# Patient Record
Sex: Female | Born: 1960 | Race: White | Hispanic: No | Marital: Married | Smoking: Current every day smoker
Health system: Southern US, Community
[De-identification: ages and names within clinical notes are randomized; demographics above are authoritative.]

## PROBLEM LIST (undated history)

## (undated) DIAGNOSIS — M6740 Ganglion, unspecified site: Secondary | ICD-10-CM

## (undated) DIAGNOSIS — R0683 Snoring: Secondary | ICD-10-CM

## (undated) DIAGNOSIS — R7303 Prediabetes: Secondary | ICD-10-CM

## (undated) DIAGNOSIS — F32A Depression, unspecified: Secondary | ICD-10-CM

## (undated) DIAGNOSIS — F419 Anxiety disorder, unspecified: Secondary | ICD-10-CM

## (undated) DIAGNOSIS — F329 Major depressive disorder, single episode, unspecified: Secondary | ICD-10-CM

## (undated) DIAGNOSIS — R112 Nausea with vomiting, unspecified: Secondary | ICD-10-CM

## (undated) DIAGNOSIS — Z9889 Other specified postprocedural states: Secondary | ICD-10-CM

## (undated) HISTORY — PX: APPENDECTOMY: SHX54

## (undated) HISTORY — PX: COLONOSCOPY: SHX174

## (undated) HISTORY — PX: SHOULDER ARTHROSCOPY: SHX128

## (undated) HISTORY — PX: ABDOMINAL HYSTERECTOMY: SHX81

## (undated) HISTORY — PX: INCISION AND DRAINAGE HIP: SHX1801

## (undated) HISTORY — PX: WRIST GANGLION EXCISION: SUR520

---
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
## 1999-01-14 ENCOUNTER — Other Ambulatory Visit: Admission: RE | Admit: 1999-01-14 | Discharge: 1999-01-14 | Payer: Self-pay | Admitting: Obstetrics and Gynecology

## 1999-09-28 ENCOUNTER — Ambulatory Visit (HOSPITAL_COMMUNITY): Admission: RE | Admit: 1999-09-28 | Discharge: 1999-09-28 | Payer: Self-pay | Admitting: Internal Medicine

## 1999-09-28 ENCOUNTER — Encounter: Payer: Self-pay | Admitting: Internal Medicine

## 1999-09-29 ENCOUNTER — Encounter: Payer: Self-pay | Admitting: Internal Medicine

## 1999-09-29 ENCOUNTER — Encounter: Admission: RE | Admit: 1999-09-29 | Discharge: 1999-09-29 | Payer: Self-pay | Admitting: Internal Medicine

## 2000-11-09 ENCOUNTER — Encounter: Admission: RE | Admit: 2000-11-09 | Discharge: 2000-11-09 | Payer: Self-pay | Admitting: Internal Medicine

## 2000-11-09 ENCOUNTER — Encounter: Payer: Self-pay | Admitting: Internal Medicine

## 2000-11-21 ENCOUNTER — Encounter: Admission: RE | Admit: 2000-11-21 | Discharge: 2000-11-21 | Payer: Self-pay | Admitting: Internal Medicine

## 2000-11-21 ENCOUNTER — Encounter: Payer: Self-pay | Admitting: Internal Medicine

## 2001-07-31 ENCOUNTER — Encounter: Admission: RE | Admit: 2001-07-31 | Discharge: 2001-07-31 | Payer: Self-pay | Admitting: Internal Medicine

## 2001-07-31 ENCOUNTER — Encounter: Payer: Self-pay | Admitting: Internal Medicine

## 2002-04-03 ENCOUNTER — Ambulatory Visit (HOSPITAL_BASED_OUTPATIENT_CLINIC_OR_DEPARTMENT_OTHER): Admission: RE | Admit: 2002-04-03 | Discharge: 2002-04-03 | Payer: Self-pay | Admitting: Orthopedic Surgery

## 2003-01-13 ENCOUNTER — Ambulatory Visit (HOSPITAL_COMMUNITY): Admission: AD | Admit: 2003-01-13 | Discharge: 2003-01-13 | Payer: Self-pay | Admitting: Surgery

## 2003-02-04 ENCOUNTER — Encounter: Admission: RE | Admit: 2003-02-04 | Discharge: 2003-02-04 | Payer: Self-pay | Admitting: General Surgery

## 2003-12-22 ENCOUNTER — Emergency Department (HOSPITAL_COMMUNITY): Admission: EM | Admit: 2003-12-22 | Discharge: 2003-12-22 | Payer: Self-pay | Admitting: Emergency Medicine

## 2004-01-28 ENCOUNTER — Ambulatory Visit (HOSPITAL_BASED_OUTPATIENT_CLINIC_OR_DEPARTMENT_OTHER): Admission: RE | Admit: 2004-01-28 | Discharge: 2004-01-28 | Payer: Self-pay | Admitting: Orthopedic Surgery

## 2005-03-04 ENCOUNTER — Ambulatory Visit (HOSPITAL_BASED_OUTPATIENT_CLINIC_OR_DEPARTMENT_OTHER): Admission: RE | Admit: 2005-03-04 | Discharge: 2005-03-04 | Payer: Self-pay | Admitting: Orthopedic Surgery

## 2005-09-29 ENCOUNTER — Other Ambulatory Visit: Admission: RE | Admit: 2005-09-29 | Discharge: 2005-09-29 | Payer: Self-pay | Admitting: Internal Medicine

## 2007-01-10 ENCOUNTER — Ambulatory Visit (HOSPITAL_BASED_OUTPATIENT_CLINIC_OR_DEPARTMENT_OTHER): Admission: RE | Admit: 2007-01-10 | Discharge: 2007-01-10 | Payer: Self-pay | Admitting: Orthopedic Surgery

## 2008-01-28 ENCOUNTER — Encounter: Admission: RE | Admit: 2008-01-28 | Discharge: 2008-01-28 | Payer: Self-pay | Admitting: Internal Medicine

## 2010-06-08 NOTE — Op Note (Signed)
Norma [name], Norma [name]                  ACCOUNT NO.:  [id number]   MEDICAL RECORD NO.:  [id number]          PATIENT TYPE:  AMB   LOCATION:  DSC                          FACILITY:  [facility]   PHYSICIAN:  Harvie Junior, M.D.   DATE OF BIRTH:  05-Oct-1960   DATE OF PROCEDURE:  01/10/2007  DATE OF DISCHARGE:                               OPERATIVE REPORT   PREOPERATIVE DIAGNOSIS:  Volar ganglion cyst, right, recurrent.   POSTOPERATIVE DIAGNOSIS:  Volar ganglion cyst, right, recurrent.   PROCEDURE:  Excision of volar ganglion cyst, right.   SURGEON:  Harvie Junior, M.D.   ASSISTANT:  Marshia Ly, P.A.-C.   ANESTHESIA:  General.   BRIEF HISTORY:  [name] is a 50 year old female with a long history of  having had a volar ganglion cyst excised, it was done in what we thought  was appropriate technique, we traced it down into the wrist joint, I  felt that we had excised it well.  She had a recurrence about two years  out versus a new cyst.  At any rate, we talked about treatment options,  but it was bothering her, so we ultimately elected for excision.  She is  brought to the operating room for this procedure.   DESCRIPTION OF PROCEDURE:  The patient was brought to the operating room  and after adequate anesthesia was obtained with general anesthetic, the  patient was placed supine upon the operating table.  The right arm was  prepped and draped in the usual sterile fashion.  Following this, the  arm was exsanguinated and blood pressure tourniquet inflated to 250  mmHg.  Following this, a curved incision was made in the area of the old  incision.  The subcutaneous tissue were taken down to the level of the  cyst.  This was clearly identified on all borders and tracked down to  the carpal canal.  Once we had it opened on all sides, we then excised a  portion of the wrist capsule and identified the distal radius,  identified the scaphoid and scapholunate interval, and took a Bovie and  bovied the edges of the wrist opening at this level.  Once that was  completed, the wound was copiously and thoroughly irrigated.  The  tourniquet was let down.  The radial artery was palpated and was  functioning fine.  There was no significant bleeding encountered.  Interrupted nylon stitches were used to close the wound.  A sterile  compressive dressing was applied as well as a volar plaster.  She did  have a postoperative infection with MRSA last time and so we are going  to put her prophylactically on some Doxycycline to go home with and  hopefully this will prevent any kind of  infectious recurrence because we do not know why she had the recurrent  ganglion cyst in the first place and we thought that maybe that was a  portion of it.  At any rate, we are going to do that.  We will see her  back in the office in about two weeks.  Estimated blood loss was none.  Complications none.      Harvie Junior, M.D.  Electronically Signed     JLG/MEDQ  D:  01/10/2007  T:  01/10/2007  Job:  [id number]

## 2010-06-11 NOTE — Op Note (Signed)
NAME:  Norma [name], Norma [name]                            ACCOUNT NO.:  [id number]   MEDICAL RECORD NO.:  [id number]                   PATIENT TYPE:  AMB   LOCATION:  DAY                                  FACILITY[facility]  [facility]   PHYSICIAN:  Velora Heckler, M.D.                DATE OF BIRTH:  [birth year]   DATE OF PROCEDURE:  01/13/2003  DATE OF DISCHARGE:                                 OPERATIVE REPORT   PREOPERATIVE DIAGNOSIS:  Right hip abscess and cellulitis.   POSTOPERATIVE DIAGNOSIS:  Right hip abscess and cellulitis.   PROCEDURES:  1. Sharp debridement necrotic skin (1 x 2.5 cm) right hip.  2. Incision and drainage abscess right hip.   SURGEON:  Velora Heckler, M.D.   ANESTHESIA:  General per Dr. Sherrian Divers.   ESTIMATED BLOOD LOSS:  Minimal.   PREPARATION:  Betadine.   COMPLICATIONS:  None.   INDICATIONS:  The patient is a 50 year old white female, who presents with  an area of cellulitis and abscess on the right hip.  This had been present  for four days.  It developed during a trip to the [place].  She is  uncertain as to whether this represented trauma or a bite or sting.  It has  progressed despite oral Levaquin.  The patient now comes to surgery for  debridement.   DESCRIPTION OF PROCEDURE:  The procedure is done in OR #1 at the [facility].  The patient is brought to the operating room and placed  in a supine position on the operating room table.  Following administration  of general anesthesia, the patient is turned to a left lateral decubitus  position.  The patient is then prepped and draped in the usual strict  aseptic fashion.  After ascertaining that adequate  level of anesthesia had  been obtained, the area of necrotic skin on the right hip is excised with a  #10 blade.  The area measures approximately 1 cm in width x 2.5 cm in  length.  It is debrided full-thickness, and hemostasis obtained with the  electrocautery.  Subcutaneous tissues are  probed, and there is a small  abscess cavity with surrounding induration.  Cultures are taken for both  aerobic and anaerobic organisms.  The wound is debrided and opened widely  using the electrocautery for hemostasis.  The wound is copiously irrigated  with warm saline.  It is then packed with half-inch Iodoform gauze packing,  then covered with dry gauze and an ABD pad.  The patient tolerated the  procedure well and was moved to the recovery room in stable condition.                                               Tawanna Cooler  Molly Maduro, M.D.   TMG/MEDQ  D:  01/13/2003  T:  01/13/2003  Job:  [id number]

## 2010-06-11 NOTE — Op Note (Signed)
NAME:  Norma [name], Norma [name]                  ACCOUNT NO.:  [id number]   MEDICAL RECORD NO.:  [id number]          PATIENT TYPE:  AMB   LOCATION:  DSC                          FACILITY:  [facility]   PHYSICIAN:  Harvie Junior, M.D.   DATE OF BIRTH:  Jul 12, 1960   DATE OF PROCEDURE:  01/28/2004  DATE OF DISCHARGE:                                 OPERATIVE REPORT   PREOPERATIVE DIAGNOSIS:  Volar ganglion cyst, right wrist.   POSTOPERATIVE DIAGNOSIS:  Volar ganglion cyst, right wrist.   PROCEDURE:  Excision of volar ganglion cyst, right wrist.   SURGEON:  Harvie Junior, M.D.   ASSISTANT:  Marshia Ly, P.A.   ANESTHESIA:  General.   BRIEF HISTORY:  She is a 50 year old female with a history of having  developed a cyst on her right wrist.  She ultimately watched this grow over  a period of time.  Because of her complaints of pain and continued growth  she ultimately wanted to have the cyst removed.  She was brought to the  operating room for this procedure.   DESCRIPTION OF PROCEDURE:  The patient was brought to the operating room and  after adequate anesthesia was obtained with general anesthetic the patient  was placed on the operating room table and the right arm was prepped and  draped in the usual sterile fashion.  Following this, the arm was  exsanguinated and blood pressure tourniquet was inflated to 350 mmHg. Under  loop magnification the incision was outlined and then a long curved incision  was made over the area of the ganglion cyst.  The subcutaneous tissue was  dissected down to the level of the cyst.  The cyst was trilobed and really  tracked up the wrist quite a bit.  The small branches of veins which were in  a web-fashion over the cyst were identified and divided under loop  magnification.  The central portion of the radial artery was identified and  went happily radial to the main body of the cyst. The cyst was tracked down  into the wrist joint.  The wrist joint was opened as  the cyst entered the  volar aspect of the wrist and cauterized throughout the opening.   At this time, the remainder of the cyst was removed in total.  At this time  the tourniquet was let down. There was some venous bleeding which was  controlled with electrocautery.  The radial artery was identified as it went  down into the wrist.  There was no evidence of breach of the radial artery.   At this point the wound was copiously irrigated and suctioned dry.  The skin  was closed with a combination of 4-0 Vicryl and 4-0 Maxon pull out sutures.  Benzoin and Steri-Strips were applied.  A sterile compressive dressing was  applied, as well as a volar plaster.  The patient was taken to recovery; she  was noted to be in satisfactory condition.   ESTIMATED BLOOD LOSS:  __________      Ranae Plumber  D:  01/28/2004  T:  01/28/2004  Job:  [id number]   cc:   Harvie Junior, M.D.  [address]  [place]  [place] [address]  Fax: [phone]

## 2010-06-11 NOTE — Op Note (Signed)
NAME:  Norma [name], Norma [name]                  ACCOUNT NO.:  [id number]   MEDICAL RECORD NO.:  [id number]          PATIENT TYPE:  AMB   LOCATION:  DSC                          FACILITY:  [facility]   PHYSICIAN:  Harvie Junior, M.D.   DATE OF BIRTH:  [birth year]   DATE OF PROCEDURE:  03/04/2005  DATE OF DISCHARGE:  03/04/2005                                 OPERATIVE REPORT   PREOPERATIVE DIAGNOSIS:  Volar ganglion cyst.   POSTOPERATIVE DIAGNOSIS:  Volar ganglion cyst.   OPERATION PERFORMED:  Removal of volar ganglion cyst, left wrist.   SURGEON:  Harvie Junior, M.D.   ASSISTANT:  Marshia Ly, P. A.   ANESTHESIA:  General.   BRIEF HISTORY:  [name] is a 50 year old female with a long history of  having had a cystic mass on her left wrist.  We had a long discussion about  treatment options including aspiration versus removal.  She ultimately  elected for removal and she was brought to the operating room for this  procedure.   DESCRIPTION OF THE OPERATION:  The patient was brought to the operating room  and conscious sedation was obtained with a general __________.  The patient  was placed on the operating table.  The left upper extremity was then  prepped and draped in the usual sterile fashion.  Following this a curved  incision was made over the distal cystic area.  Subcutaneous tissue was  excised down to the level of the cyst.  The cyst was clearly identified and  divided free from all the underlying connections.  At that point the cyst  was excised completely.  The stalk of the cyst was tract down to the  scapholunate interosseous ligament.  Once this was accomplished the cystic  area was removed completely.  The opening around the cyst was then  cauterized.  Following this the wound was copiously irrigated and suctioned  dry.  The wound was then closed in layers.  A sterile compression dressing  was applied.   The patient was taken to recovery where she was noted to be in  satisfactory  condition in a small volar plaster splint.   ESTIMATED BLOOD LOSS:  The estimated blood loss for the procedure was none.   COMPLICATIONS:  There were no complications.      Harvie Junior, M.D.  Electronically Signed     JLG/MEDQ  D:  06/22/2005  T:  06/23/2005  Job:  [id number]

## 2010-06-11 NOTE — Op Note (Signed)
NAME:  MILLIANNA, SZYMBORSKI                            ACCOUNT NO.:  [id number]   MEDICAL RECORD NO.:  [id number]                   PATIENT TYPE:  AMB   LOCATION:  DSC                                  FACILITY:  [facility]   PHYSICIAN:  Harvie Junior, M.D.                DATE OF BIRTH:  Nov 08, 1960   DATE OF PROCEDURE:  04/03/2002  DATE OF DISCHARGE:                                 OPERATIVE REPORT   PREOPERATIVE DIAGNOSES:  1. Impingement.  2. Acromioclavicular joint arthritis.   POSTOPERATIVE DIAGNOSES:  1. Impingement.  2. Acromioclavicular joint arthritis.  3. Superior labral tear.   PROCEDURES:  1. Anterolateral acromioplasty.  2. Distal clavicle resection.  3. Debridement of the superior labral tear.   SURGEON:  Harvie Junior, M.D.   ASSISTANT:  Marshia Ly, P.A.   ANESTHESIA:  General.   BRIEF HISTORY:  She is a 50 year old female with a long history of having  right shoulder pain.  We ultimately treated her with injection therapy,  activity modification, and medication, exercise therapy.  All of this failed  and because of continued and chronic complaints of pain in the right  shoulder, she was ultimately brought to the operating room for subacromial  decompression and distal clavicle resection.  Preoperative evaluation showed  that she had a type 2 acromion, she had narrowing at the [facility] joint.  She did  not have a preoperative MRI.  The patient was brought to the operating room  for evaluation under anesthesia and arthroscopy.   DESCRIPTION OF PROCEDURE:  The patient was brought to the operating room and  after adequate anesthesia was obtained with a general anesthetic, the  patient positioned supine and the right arm was prepped and draped in the  usual sterile fashion.  Following this, routine arthroscopic examination of  the shoulder revealed that there was an obvious anterior superior labral  tear.  This was debrided from within the glenohumeral joint.  Once this  was  accomplished, the rotator cuff was evaluated from the undersurface.  There  was some partial-thickness tearing from the glenohumeral joint.  It was not  dramatic, but we did debride the undersurface of the rotator cuff.  The  glenohumeral joint was evaluated and noted to have no significant evidence  of abnormality.  The labrum was normal other than the superior labrum, which  had been debrided.  Following this the cannula was removed from the  glenohumeral joint.  Attention was turned to the subacromial space, where an  anterolateral acromioplasty was performed with a motorized bur.  The bur was  taken from the lateral portal and also from the anterior portal to allow for  adequate resection of the anterior portion of the acromion.  Once this had  been accomplished, attention was turned to the distal clavicle, where a  distal clavicle resection of 15 mm was undertaken arthroscopically.  Once  this was accomplished, attention was turned to the posterior portal, where  the rotator cuff was evaluated.  A significant subtotal bursectomy was then  undertaken and the bursa was cleaned off the rotator cuff from the side,  top, and back.  At this point the rotator cuff was evaluated thoroughly.  There was no evidence of full-thickness tearing or even partial-thickness  tearing from the superior surface.  At this point the shoulder was copiously  irrigated and suctioned dry.  The arthroscopic portals were closed with a  bandage, a sterile and compressive dressing was applied, and the patient was  taken to the recovery room, where she was noted to be in satisfactory  condition.  Estimated blood loss for the procedure was none.                                                  Harvie Junior, M.D.    Ranae Plumber  D:  04/03/2002  T:  04/04/2002  Job:  [id number]

## 2010-06-29 ENCOUNTER — Emergency Department (HOSPITAL_COMMUNITY)
Admission: EM | Admit: 2010-06-29 | Discharge: 2010-06-29 | Disposition: A | Discharge status: Home / Self Care | Payer: 59 | Attending: Emergency Medicine | Admitting: Emergency Medicine

## 2010-06-29 ENCOUNTER — Emergency Department (HOSPITAL_COMMUNITY): Payer: 59

## 2010-06-29 DIAGNOSIS — L259 Unspecified contact dermatitis, unspecified cause: Secondary | ICD-10-CM | POA: Insufficient documentation

## 2010-06-29 DIAGNOSIS — M7989 Other specified soft tissue disorders: Secondary | ICD-10-CM | POA: Insufficient documentation

## 2010-06-29 DIAGNOSIS — S6990XA Unspecified injury of unspecified wrist, hand and finger(s), initial encounter: Secondary | ICD-10-CM | POA: Insufficient documentation

## 2010-06-29 DIAGNOSIS — X500XXA Overexertion from strenuous movement or load, initial encounter: Secondary | ICD-10-CM | POA: Insufficient documentation

## 2010-06-29 DIAGNOSIS — M20009 Unspecified deformity of unspecified finger(s): Secondary | ICD-10-CM | POA: Insufficient documentation

## 2010-06-29 DIAGNOSIS — S6980XA Other specified injuries of unspecified wrist, hand and finger(s), initial encounter: Secondary | ICD-10-CM | POA: Insufficient documentation

## 2010-06-29 DIAGNOSIS — R110 Nausea: Secondary | ICD-10-CM | POA: Insufficient documentation

## 2010-06-29 DIAGNOSIS — IMO0002 Reserved for concepts with insufficient information to code with codable children: Secondary | ICD-10-CM | POA: Insufficient documentation

## 2010-06-29 DIAGNOSIS — M79609 Pain in unspecified limb: Secondary | ICD-10-CM | POA: Insufficient documentation

## 2010-06-29 DIAGNOSIS — S6000XA Contusion of unspecified finger without damage to nail, initial encounter: Secondary | ICD-10-CM | POA: Insufficient documentation

## 2010-06-29 DIAGNOSIS — F411 Generalized anxiety disorder: Secondary | ICD-10-CM | POA: Insufficient documentation
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
## 2010-10-29 LAB — BASIC METABOLIC PANEL
BUN: 10
CO2: 25
Chloride: 103
Glucose, Bld: 95
Potassium: 4.4

## 2011-03-28 ENCOUNTER — Encounter (HOSPITAL_BASED_OUTPATIENT_CLINIC_OR_DEPARTMENT_OTHER): Payer: Self-pay | Admitting: *Deleted

## 2011-03-29 ENCOUNTER — Other Ambulatory Visit: Payer: Self-pay | Admitting: Orthopedic Surgery

## 2011-03-30 ENCOUNTER — Encounter (HOSPITAL_BASED_OUTPATIENT_CLINIC_OR_DEPARTMENT_OTHER): Payer: Self-pay | Admitting: Anesthesiology

## 2011-03-30 ENCOUNTER — Ambulatory Visit (HOSPITAL_BASED_OUTPATIENT_CLINIC_OR_DEPARTMENT_OTHER)
Admission: RE | Admit: 2011-03-30 | Discharge: 2011-03-30 | Disposition: A | Discharge status: Home / Self Care | Payer: 59 | Source: Ambulatory Visit | Attending: Orthopedic Surgery | Admitting: Orthopedic Surgery

## 2011-03-30 ENCOUNTER — Encounter (HOSPITAL_BASED_OUTPATIENT_CLINIC_OR_DEPARTMENT_OTHER)
Admission: RE | Disposition: A | Discharge status: Home / Self Care | Payer: Self-pay | Source: Ambulatory Visit | Attending: Orthopedic Surgery

## 2011-03-30 ENCOUNTER — Ambulatory Visit (HOSPITAL_BASED_OUTPATIENT_CLINIC_OR_DEPARTMENT_OTHER): Payer: 59 | Admitting: Anesthesiology

## 2011-03-30 ENCOUNTER — Encounter (HOSPITAL_BASED_OUTPATIENT_CLINIC_OR_DEPARTMENT_OTHER): Payer: Self-pay | Admitting: *Deleted

## 2011-03-30 DIAGNOSIS — M19019 Primary osteoarthritis, unspecified shoulder: Secondary | ICD-10-CM | POA: Insufficient documentation

## 2011-03-30 DIAGNOSIS — F411 Generalized anxiety disorder: Secondary | ICD-10-CM | POA: Insufficient documentation

## 2011-03-30 DIAGNOSIS — Z5333 Arthroscopic surgical procedure converted to open procedure: Secondary | ICD-10-CM | POA: Insufficient documentation

## 2011-03-30 DIAGNOSIS — F329 Major depressive disorder, single episode, unspecified: Secondary | ICD-10-CM | POA: Insufficient documentation

## 2011-03-30 DIAGNOSIS — M24019 Loose body in unspecified shoulder: Secondary | ICD-10-CM | POA: Insufficient documentation

## 2011-03-30 DIAGNOSIS — Z01812 Encounter for preprocedural laboratory examination: Secondary | ICD-10-CM | POA: Insufficient documentation

## 2011-03-30 DIAGNOSIS — F3289 Other specified depressive episodes: Secondary | ICD-10-CM | POA: Insufficient documentation

## 2011-03-30 DIAGNOSIS — M66329 Spontaneous rupture of flexor tendons, unspecified upper arm: Secondary | ICD-10-CM | POA: Insufficient documentation

## 2011-03-30 DIAGNOSIS — M25819 Other specified joint disorders, unspecified shoulder: Secondary | ICD-10-CM | POA: Insufficient documentation

## 2011-03-30 DIAGNOSIS — M25519 Pain in unspecified shoulder: Secondary | ICD-10-CM | POA: Insufficient documentation

## 2011-03-30 HISTORY — DX: Major depressive disorder, single episode, unspecified: F32.9

## 2011-03-30 HISTORY — DX: Depression, unspecified: F32.A

## 2011-03-30 HISTORY — DX: Anxiety disorder, unspecified: F41.9

## 2011-03-30 SURGERY — SHOULDER ARTHROSCOPY WITH SUBACROMIAL DECOMPRESSION
Anesthesia: General | Site: Shoulder | Laterality: Right | Wound class: Clean

## 2011-03-30 MED ORDER — MIDAZOLAM HCL 2 MG/2ML IJ SOLN
1.0000 mg | INTRAMUSCULAR | Status: DC | PRN
  Administered 2011-03-30: 2 mg via INTRAVENOUS

## 2011-03-30 MED ORDER — FENTANYL CITRATE 0.05 MG/ML IJ SOLN
INTRAMUSCULAR | Status: DC | PRN
  Administered 2011-03-30: 50 ug via INTRAVENOUS
  Administered 2011-03-30 (×2): 25 ug via INTRAVENOUS

## 2011-03-30 MED ORDER — MIDAZOLAM HCL 5 MG/5ML IJ SOLN
INTRAMUSCULAR | Status: DC | PRN
  Administered 2011-03-30: 2 mg via INTRAVENOUS

## 2011-03-30 MED ORDER — SODIUM CHLORIDE 0.9 % IR SOLN
Status: DC | PRN
  Administered 2011-03-30: 15:00:00

## 2011-03-30 MED ORDER — HYDROMORPHONE HCL 2 MG PO TABS
2.0000 mg | ORAL_TABLET | Freq: Once | ORAL | Status: AC
  Administered 2011-03-30: 2 mg via ORAL

## 2011-03-30 MED ORDER — SUCCINYLCHOLINE CHLORIDE 20 MG/ML IJ SOLN
INTRAMUSCULAR | Status: DC | PRN
  Administered 2011-03-30: 100 mg via INTRAVENOUS

## 2011-03-30 MED ORDER — ONDANSETRON HCL 4 MG/2ML IJ SOLN
4.0000 mg | Freq: Once | INTRAMUSCULAR | Status: AC
  Administered 2011-03-30: 4 mg via INTRAVENOUS

## 2011-03-30 MED ORDER — POVIDONE-IODINE 7.5 % EX SOLN: Freq: Once | CUTANEOUS | Status: DC

## 2011-03-30 MED ORDER — PROPOFOL 10 MG/ML IV EMUL
INTRAVENOUS | Status: DC | PRN
  Administered 2011-03-30: 260 mg via INTRAVENOUS

## 2011-03-30 MED ORDER — ONDANSETRON HCL 4 MG/2ML IJ SOLN: 4.0000 mg | Freq: Once | INTRAMUSCULAR | Status: DC | PRN

## 2011-03-30 MED ORDER — HYDROMORPHONE HCL 2 MG PO TABS: ORAL_TABLET | ORAL | Status: DC

## 2011-03-30 MED ORDER — MORPHINE SULFATE 4 MG/ML IJ SOLN: 0.0500 mg/kg | INTRAMUSCULAR | Status: DC | PRN

## 2011-03-30 MED ORDER — FENTANYL CITRATE 0.05 MG/ML IJ SOLN
50.0000 ug | INTRAMUSCULAR | Status: DC | PRN
  Administered 2011-03-30: 100 ug via INTRAVENOUS

## 2011-03-30 MED ORDER — BUPIVACAINE-EPINEPHRINE PF 0.5-1:200000 % IJ SOLN
INTRAMUSCULAR | Status: DC | PRN
  Administered 2011-03-30: 25 mL

## 2011-03-30 MED ORDER — KETOROLAC TROMETHAMINE 30 MG/ML IJ SOLN
INTRAMUSCULAR | Status: DC | PRN
  Administered 2011-03-30: 30 mg via INTRAVENOUS

## 2011-03-30 MED ORDER — DEXAMETHASONE SODIUM PHOSPHATE 4 MG/ML IJ SOLN
INTRAMUSCULAR | Status: DC | PRN
  Administered 2011-03-30: 10 mg via INTRAVENOUS

## 2011-03-30 MED ORDER — CEFAZOLIN SODIUM 1-5 GM-% IV SOLN
1.0000 g | Freq: Once | INTRAVENOUS | Status: AC
  Administered 2011-03-30: 1 g via INTRAVENOUS

## 2011-03-30 MED ORDER — ONDANSETRON HCL 4 MG/2ML IJ SOLN
INTRAMUSCULAR | Status: DC | PRN
  Administered 2011-03-30: 4 mg via INTRAVENOUS

## 2011-03-30 MED ORDER — LIDOCAINE HCL (CARDIAC) 20 MG/ML IV SOLN
INTRAVENOUS | Status: DC | PRN
  Administered 2011-03-30: 80 mg via INTRAVENOUS

## 2011-03-30 MED ORDER — LACTATED RINGERS IV SOLN
INTRAVENOUS | Status: DC
  Administered 2011-03-30: 10 mL/h via INTRAVENOUS
  Administered 2011-03-30 (×2): via INTRAVENOUS

## 2011-03-30 MED ORDER — CEFAZOLIN SODIUM-DEXTROSE 2-3 GM-% IV SOLR
2.0000 g | INTRAVENOUS | Status: AC
  Administered 2011-03-30: 2 g via INTRAVENOUS

## 2011-03-30 MED ORDER — HYDROMORPHONE HCL PF 1 MG/ML IJ SOLN
0.2500 mg | INTRAMUSCULAR | Status: DC | PRN
  Administered 2011-03-30 (×3): 0.5 mg via INTRAVENOUS

## 2011-03-30 MED ORDER — OXYCODONE-ACETAMINOPHEN 5-325 MG PO TABS: 1.0000 | ORAL_TABLET | Freq: Four times a day (QID) | ORAL | Status: DC | PRN

## 2011-03-30 SURGICAL SUPPLY — 76 items
BENZOIN TINCTURE PRP APPL 2/3 (GAUZE/BANDAGES/DRESSINGS) ×2 IMPLANT
BLADE SURG 15 STRL LF DISP TIS (BLADE) IMPLANT
BLADE SURG 15 STRL SS (BLADE)
BLADE VORTEX 6.0 (BLADE) ×2 IMPLANT
CANISTER OMNI JUG 16 LITER (MISCELLANEOUS) ×2 IMPLANT
CANISTER SUCTION 2500CC (MISCELLANEOUS) IMPLANT
CANNULA 5.75X71 LONG (CANNULA) IMPLANT
CANNULA TWIST IN 8.25X7CM (CANNULA) IMPLANT
CLOTH BEACON ORANGE TIMEOUT ST (SAFETY) ×2 IMPLANT
CUTTER MENISCUS  4.2MM (BLADE) ×1
CUTTER MENISCUS 4.2MM (BLADE) ×1 IMPLANT
DECANTER SPIKE VIAL GLASS SM (MISCELLANEOUS) IMPLANT
DRAPE INCISE IOBAN 66X45 STRL (DRAPES) ×2 IMPLANT
DRAPE STERI 35X30 U-POUCH (DRAPES) ×2 IMPLANT
DRAPE SURG 17X23 STRL (DRAPES) ×2 IMPLANT
DRAPE U-SHAPE 47X51 STRL (DRAPES) ×2 IMPLANT
DRAPE U-SHAPE 76X120 STRL (DRAPES) ×4 IMPLANT
DRSG EMULSION OIL 3X3 NADH (GAUZE/BANDAGES/DRESSINGS) ×2 IMPLANT
DRSG PAD ABDOMINAL 8X10 ST (GAUZE/BANDAGES/DRESSINGS) ×2 IMPLANT
DURAPREP 26ML APPLICATOR (WOUND CARE) ×2 IMPLANT
ELECT REM PT RETURN 9FT ADLT (ELECTROSURGICAL) ×2
ELECTRODE REM PT RTRN 9FT ADLT (ELECTROSURGICAL) ×1 IMPLANT
GAUZE SPONGE 4X4 12PLY STRL LF (GAUZE/BANDAGES/DRESSINGS) ×2 IMPLANT
GLOVE BIO SURGEON STRL SZ 6.5 (GLOVE) ×2 IMPLANT
GLOVE BIOGEL M STRL SZ7.5 (GLOVE) ×2 IMPLANT
GLOVE BIOGEL PI IND STRL 8 (GLOVE) ×3 IMPLANT
GLOVE BIOGEL PI INDICATOR 8 (GLOVE) ×3
GLOVE ECLIPSE 7.5 STRL STRAW (GLOVE) ×4 IMPLANT
GOWN BRE IMP PREV XXLGXLNG (GOWN DISPOSABLE) ×2 IMPLANT
GOWN PREVENTION PLUS XLARGE (GOWN DISPOSABLE) ×2 IMPLANT
GOWN PREVENTION PLUS XXLARGE (GOWN DISPOSABLE) ×2 IMPLANT
KIT BIO-TENODESIS 3X8 DISP (MISCELLANEOUS) ×1
KIT INSRT BABSR STRL DISP BTN (MISCELLANEOUS) ×1 IMPLANT
NDL SUT 6 .5 CRC .975X.05 MAYO (NEEDLE) ×1 IMPLANT
NEEDLE 1/2 CIR CATGUT .05X1.09 (NEEDLE) IMPLANT
NEEDLE HYPO 18GX1.5 BLUNT FILL (NEEDLE) ×2 IMPLANT
NEEDLE MAYO TAPER (NEEDLE) ×1
NEEDLE SCORPION MULTI FIRE (NEEDLE) IMPLANT
NS IRRIG 1000ML POUR BTL (IV SOLUTION) IMPLANT
PACK ARTHROSCOPY DSU (CUSTOM PROCEDURE TRAY) ×4 IMPLANT
PACK BASIN DAY SURGERY FS (CUSTOM PROCEDURE TRAY) ×2 IMPLANT
PASSER SUT SWANSON 36MM LOOP (INSTRUMENTS) IMPLANT
PENCIL BUTTON HOLSTER BLD 10FT (ELECTRODE) ×2 IMPLANT
SCREW BIO TENODEIS 7MM (Screw) ×2 IMPLANT
SET IRRIG Y TYPE TUR BLADDER L (SET/KITS/TRAYS/PACK) ×2 IMPLANT
SLING ARM FOAM STRAP LRG (SOFTGOODS) IMPLANT
SLING ARM FOAM STRAP MED (SOFTGOODS) IMPLANT
SLING ARM FOAM STRAP XLG (SOFTGOODS) ×2 IMPLANT
SLING ARM IMMOBILIZER LRG (SOFTGOODS) IMPLANT
SPONGE GAUZE 4X4 12PLY (GAUZE/BANDAGES/DRESSINGS) ×2 IMPLANT
SPONGE LAP 4X18 X RAY DECT (DISPOSABLE) ×2 IMPLANT
STRIP CLOSURE SKIN 1/2X4 (GAUZE/BANDAGES/DRESSINGS) ×2 IMPLANT
SUCTION FRAZIER TIP 10 FR DISP (SUCTIONS) ×2 IMPLANT
SUT BONE WAX W31G (SUTURE) ×2 IMPLANT
SUT ETHIBOND 2 OS 4 DA (SUTURE) IMPLANT
SUT ETHILON 4 0 PS 2 18 (SUTURE) IMPLANT
SUT MNCRL AB 3-0 PS2 18 (SUTURE) ×2 IMPLANT
SUT PDS AB 0 CT 36 (SUTURE) IMPLANT
SUT PROLENE 3 0 PS 2 (SUTURE) IMPLANT
SUT TICRON 1 T 12 (SUTURE) IMPLANT
SUT TIGER TAPE 7 IN WHITE (SUTURE) IMPLANT
SUT VIC AB 0 CT1 27 (SUTURE) ×1
SUT VIC AB 0 CT1 27XBRD ANBCTR (SUTURE) ×1 IMPLANT
SUT VIC AB 1 CT1 27 (SUTURE) ×1
SUT VIC AB 1 CT1 27XBRD ANBCTR (SUTURE) ×1 IMPLANT
SUT VIC AB 2-0 SH 27 (SUTURE) ×1
SUT VIC AB 2-0 SH 27XBRD (SUTURE) ×1 IMPLANT
SYR 5ML LL (SYRINGE) ×2 IMPLANT
TAPE CLOTH SURG 6X10 WHT LF (GAUZE/BANDAGES/DRESSINGS) ×2 IMPLANT
TAPE FIBER 2MM 7IN #2 BLUE (SUTURE) IMPLANT
TOWEL OR 17X24 6PK STRL BLUE (TOWEL DISPOSABLE) ×4 IMPLANT
TOWEL OR NON WOVEN STRL DISP B (DISPOSABLE) ×2 IMPLANT
TUBE CONNECTING 20X1/4 (TUBING) IMPLANT
WAND STAR VAC 90 (SURGICAL WAND) ×2 IMPLANT
WATER STERILE IRR 1000ML POUR (IV SOLUTION) ×2 IMPLANT
YANKAUER SUCT BULB TIP NO VENT (SUCTIONS) IMPLANT

## 2011-03-30 NOTE — Anesthesia Preprocedure Evaluation (Signed)
Anesthesia Evaluation  Patient identified by MRN, date of birth, ID band Patient awake    Reviewed: Allergy & Precautions, H&P , NPO status , Patient's Chart, lab work & pertinent test results  Airway Mallampati: I TM Distance: >3 FB     Dental No notable dental hx.    Pulmonary  breath sounds clear to auscultation        Cardiovascular Rhythm:Regular Rate:Normal     Neuro/Psych    GI/Hepatic   Endo/Other    Renal/GU      Musculoskeletal   Abdominal   Peds  Hematology   Anesthesia Other Findings   Reproductive/Obstetrics                           Anesthesia Physical Anesthesia Plan  ASA: I  Anesthesia Plan: General   Post-op Pain Management:    Induction: Intravenous  Airway Management Planned: Oral ETT  Additional Equipment:   Intra-op Plan:   Post-operative Plan: Extubation in OR  Informed Consent: I have reviewed the patients History and Physical, chart, labs and discussed the procedure including the risks, benefits and alternatives for the proposed anesthesia with the patient or authorized representative who has indicated his/her understanding and acceptance.   Dental advisory given  Plan Discussed with: CRNA, Anesthesiologist and Surgeon  Anesthesia Plan Comments:         Anesthesia Quick Evaluation

## 2011-03-30 NOTE — Anesthesia Procedure Notes (Addendum)
Anesthesia Regional Block:  Interscalene brachial plexus block  Pre-Anesthetic Checklist: ,, timeout performed, Correct Patient, Correct Site, Correct Laterality, Correct Procedure, Correct Position, site marked, Risks and benefits discussed,  Surgical consent,  Pre-op evaluation,  At surgeon's request and post-op pain management  Laterality: Right and Upper  Prep: chloraprep       Needles:  Injection technique: Single-shot  Needle Type: Echogenic Needle     Needle Length: 5cm 5 cm Needle Gauge: 22 and 22 G    Additional Needles:  Procedures: ultrasound guided Interscalene brachial plexus block Narrative:  Start time: 03/30/2011 1:50 PM End time: 03/30/2011 2:05 PM Injection made incrementally with aspirations every 5 mL.  Performed by: Personally  Anesthesiologist: Sheldon Silvan  Supraclavicular block Procedure Name: Intubation Date/Time: 03/30/2011 2:56 PM Performed by: Gladys Damme Pre-anesthesia Checklist: Patient identified, Emergency Drugs available, Suction available and Patient being monitored Patient Re-evaluated:Patient Re-evaluated prior to inductionOxygen Delivery Method: Circle system utilized Preoxygenation: Pre-oxygenation with 100% oxygen Intubation Type: IV induction Ventilation: Mask ventilation without difficulty Laryngoscope Size: Miller and 2 Grade View: Grade I Tube type: Oral Number of attempts: 1 Placement Confirmation: ETT inserted through vocal cords under direct vision,  breath sounds checked- equal and bilateral and positive ETCO2 Secured at: 22 cm Tube secured with: Tape Dental Injury: Teeth and Oropharynx as per pre-operative assessment

## 2011-03-30 NOTE — Discharge Instructions (Signed)
[facility]  [address] [place], [place] [address] [phone]   Post Anesthesia Home Care Instructions  Activity: Get plenty of rest for the remainder of the day. A responsible adult should stay with you for 24 hours following the procedure.  For the next 24 hours, DO NOT: -Drive a car -Advertising copywriter -Drink alcoholic beverages -Take any medication unless instructed by your physician -Make any legal decisions or sign important papers.  Meals: Start with liquid foods such as gelatin or soup. Progress to regular foods as tolerated. Avoid greasy, spicy, heavy foods. If nausea and/or vomiting occur, drink only clear liquids until the nausea and/or vomiting subsides. Call your physician if vomiting continues.  Special Instructions/Symptoms: Your throat may feel dry or sore from the anesthesia or the breathing tube placed in your throat during surgery. If this causes discomfort, gargle with warm salt water. The discomfort should disappear within 24 hours.         Discharge Instructions after Arthroscopic Shoulder Surgery   A sling has been provided for you. You may remove the sling after 72 hours. The sling may be worn for your protection, if you are in a crowd.  Use ice on the shoulder intermittently over the first 48 hours after surgery.  Pain medication has been prescribed for you.  Use your medication liberally over the first 48 hours, and then begin to taper your use. You may take Extra Strength Tylenol or Tylenol only in place of the pain pills. DO NOT take ANY nonsteroidal anti-inflammatory pain medications: Advil, Motrin, Ibuprofen, Aleve, Naproxen, or Naprosyn.  You may remove your dressing after two days.  You may shower 5 days after surgery. The incision CANNOT get wet prior to 5 days. Simply allow the water to wash over the site and then pat dry. Do not rub the incision. Make sure your axilla (armpit) is completely dry after showering.  Take one  aspirin a day for 2 weeks after surgery, unless you have an aspirin sensitivity/allergy or asthma.  Three to 5 times each day you should perform assisted overhead reaching and external rotation (outward turning) exercises with the operative arm. Both exercises should be done with the non-operative arm used as the "therapist arm" while the operative arm remains relaxed. Ten of each exercise should be done three to five times each day.    Overhead reach is helping to lift your stiff arm up as high as it will go. To stretch your overhead reach, lie flat on your back, relax, and grasp the wrist of the tight shoulder with your opposite hand. Using the power in your opposite arm, bring the stiff arm up as far as it is comfortable. Start holding it for ten seconds and then work up to where you can hold it for a count of 30. Breathe slowly and deeply while the arm is moved. Repeat this stretch ten times, trying to help the arm up a little higher each time.       External rotation is turning the arm out to the side while your elbow stays close to your body. External rotation is best stretched while you are lying on your back. Hold a cane, yardstick, broom handle, or dowel in both hands. Bend both elbows to a right angle. Use steady, gentle force from your normal arm to rotate the hand of the stiff shoulder out away from your body. Continue the rotation as far as it will go comfortably, holding it there for a  count of 10. Repeat this exercise ten times.     Please call [phone] during normal business hours or [phone] after hours for any problems. Including the following:  - excessive redness of the incisions - drainage for more than 4 days - fever of more than 101.5 F  *Please note that pain medications will not be refilled after hours or on weekends.

## 2011-03-30 NOTE — Brief Op Note (Signed)
03/30/2011  5:09 PM  PATIENT:  Norma [name]  51 y.o. female  PRE-OPERATIVE DIAGNOSIS:  impingement, acromioclavicular joint arthritis right shoulder  POST-OPERATIVE DIAGNOSIS:  impingement, acromioclavicular joint arthritis right shoulder  PROCEDURE:  Procedure(s) (LRB): SHOULDER ARTHROSCOPY WITH SUBACROMIAL DECOMPRESSION (Right)  SURGEON:  Surgeon(s) and Role:    * Harvie Junior, MD - Primary  PHYSICIAN ASSISTANT:   ASSISTANTS: bethune   ANESTHESIA:   general  EBL:  Total I/O In: 1600 [I.V.:1600] Out: -   BLOOD ADMINISTERED:none  DRAINS: none   LOCAL MEDICATIONS USED:  NONE  SPECIMEN:  No Specimen  DISPOSITION OF SPECIMEN:  N/A  COUNTS:  YES  TOURNIQUET:  * No tourniquets in log *  DICTATION: .Other Dictation: Dictation Number [id number]  PLAN OF CARE: Discharge to home after PACU  PATIENT DISPOSITION:  PACU - hemodynamically stable.   Delay start of Pharmacological VTE agent (>24hrs) due to surgical blood loss or risk of bleeding: not applicable

## 2011-03-30 NOTE — Progress Notes (Signed)
Assisted Dr. Crews with right, ultrasound guided, interscalene  block. Side rails up, monitors on throughout procedure. See vital signs in flow sheet. Tolerated Procedure well. 

## 2011-03-30 NOTE — Transfer of Care (Signed)
Immediate Anesthesia Transfer of Care Note  Patient: Norma [name]  Procedure(s) Performed: Procedure(s) (LRB): SHOULDER ARTHROSCOPY WITH SUBACROMIAL DECOMPRESSION (Right)  Patient Location: PACU  Anesthesia Type: GA combined with regional for post-op pain  Level of Consciousness: awake, alert  and oriented  Airway & Oxygen Therapy: Patient Spontanous Breathing and Patient connected to face mask oxygen  Post-op Assessment: Report given to PACU RN and Post -op Vital signs reviewed and stable  Post vital signs: Reviewed and stable  Complications: No apparent anesthesia complications

## 2011-03-30 NOTE — Anesthesia Postprocedure Evaluation (Signed)
  Anesthesia Post-op Note  Patient: Norma [name]  Procedure(s) Performed: Procedure(s) (LRB): SHOULDER ARTHROSCOPY WITH SUBACROMIAL DECOMPRESSION (Right)  Patient Location: PACU  Anesthesia Type: GA combined with regional for post-op pain  Level of Consciousness: awake, alert  and oriented  Airway and Oxygen Therapy: Patient Spontanous Breathing and Patient connected to face mask oxygen  Post-op Pain: mild  Post-op Assessment: Post-op Vital signs reviewed, Patient's Cardiovascular Status Stable, Respiratory Function Stable, Patent Airway, No signs of Nausea or vomiting and Pain level controlled  Post-op Vital Signs: Reviewed and stable  Complications: No apparent anesthesia complications

## 2011-03-30 NOTE — H&P (Signed)
PREOPERATIVE H&P  Chief Complaint: R. Shoulder pain  HPI: Norma [name] is a 51 y.o. female who presents for evaluation of R. Shoulder pain. It has been present for 6 mon and has been worsening. She has failed conservative measures. Pain is rated as moderate.  Past Medical History  Diagnosis Date  . Anxiety   . Depression    Past Surgical History  Procedure Date  . Appendectomy   . Abdominal hysterectomy   . Wrist ganglion excision     X2 RT WRIST, X1 LT WRIST  . Incision and drainage hip     RT  . Shoulder arthroscopy     RT   History   Social History  . Marital Status: Married    Spouse Name: N/A    Number of Children: N/A  . Years of Education: N/A   Social History Main Topics  . Smoking status: Current Everyday Smoker -- 0.2 packs/day  . Smokeless tobacco: Never Used  . Alcohol Use: Yes     SOCIAL  . Drug Use: No  . Sexually Active:    Other Topics Concern  . None   Social History Narrative  . None   History reviewed. No pertinent family history. No Known Allergies Prior to Admission medications   Medication Sig Start Date End Date Taking? Authorizing Provider  clonazePAM (KLONOPIN) 1 MG tablet Take 1 mg by mouth at bedtime.   Yes Historical Provider, MD  escitalopram (LEXAPRO) 20 MG tablet Take 20 mg by mouth at bedtime.   Yes Historical Provider, MD     Positive ROS: none  All other systems have been reviewed and were otherwise negative with the exception of those mentioned in the HPI and as above.  Physical Exam: Filed Vitals:   03/30/11 1255  BP: 124/81  Pulse: 66  Temp: 97.6 F (36.4 C)  Resp: 18    General: Alert, no acute distress Cardiovascular: No pedal edema Respiratory: No cyanosis, no use of accessory musculature GI: No organomegaly, abdomen is soft and non-tender Skin: No lesions in the area of chief complaint Neurologic: Sensation intact distally Psychiatric: Patient is competent for consent with normal mood and  affect Lymphatic: No axillary or cervical lymphadenopathy  MUSCULOSKELETAL: R. Shoulder with pain on Rom.  +TTP over [facility] joint  Assessment/Plan: impingement, ac joint arthritis right shoulder Plan for Procedure(s): SHOULDER ARTHROSCOPY WITH SUBACROMIAL DECOMPRESSION and open DCE  The risks benefits and alternatives were discussed with the patient including but not limited to the risks of nonoperative treatment, versus surgical intervention including infection, bleeding, nerve injury, malunion, nonunion, hardware prominence, hardware failure, need for hardware removal, blood clots, cardiopulmonary complications, morbidity, mortality, among others, and they were willing to proceed.  Predicted outcome is good, although there will be at least a six to nine month expected recovery.  Tejah Brekke L, MD 03/30/2011 1:03 PM

## 2011-03-31 NOTE — Op Note (Signed)
NAME:  Tow, Remona                       ACCOUNT NO.:  MEDICAL RECORD NO.:  [id number]  LOCATION:                                 FACILITY:  PHYSICIAN:  Harvie Junior, M.D.        DATE OF BIRTH:  DATE OF PROCEDURE:  03/30/2011 DATE OF DISCHARGE:                              OPERATIVE REPORT   PREOPERATIVE DIAGNOSES:  Acromioclavicular joint pain with suspected biceps tendon injury with impingement and acromioclavicular joint arthritis.  POSTOPERATIVE DIAGNOSES:  Acromioclavicular joint pain with suspected biceps tendon injury with impingement and acromioclavicular joint arthritis and loose body, glenohumeral joint.  PROCEDURES: 1. Open biceps tenodesis. 2. Open distal clavicle resection over 20 mm. 3. Arthroscopic subacromial decompression. 4. Arthroscopic debridement of anterior-superior labral tear and     undersurface of rotator cuff tear and release of the biceps tendon;     all from within the glenohumeral joint. 5. Excision of loose body, glenohumeral joint.  SURGEON:  Harvie Junior, MD  ASSISTANT:  Marshia Ly, PA  ANESTHESIA:  General.  BRIEF HISTORY:  Ms. Hackbart is a 51 year old female with long history having significant complaints of right shoulder pain.  She had been doing a lot of lifting at home with her son, who is paralyzed and because of all this lifting she has been having increasing shoulder pain.  We evaluated her.  She had had pain off and on previously.  We took images which showed that she had a kind of hypertrophic distal clavicle.  Because of continued complaints of pain, we did injection therapy. Selective injection in the [facility] joint seemed to help.  Selective injection in the subacromial seemed to help, but her pain recurred. Because of failure of conservative care,  she was ultimately taken to the operating room for operative shoulder arthroscopy.  PROCEDURE:  The patient was taken to the operating room. After adequate anesthesia was  obtained with general anesthetic, the patient was placed supine on the operating table.  She was then moved to the beach-chair position and all bony prominences were well padded.  Attention was then turned to the right shoulder.  After routine prep and drape, shoulder arthroscopy was performed.  It showed that she had a flap undersurface of rotator cuff tear which was debrided.  She did have a large loose body within the glenohumeral joint.  We debrided the undersurface of the rotator cuff tear as well as the superior labral tear with anterior to posterior.  The biceps tendon had impending rupture and this was released anteriorly with a straight biter.  Once this was completed, the attention was turned towards what would be the donor site for this loose body.  Really could not identify a good donor site and this loose body was debrided out with a suction shaver.  Once this was completed, attention turned out of the glenohumeral joint into the subacromial space.  Once the subacromial space was identified, we did a debridement of the subacromial space.  We looked at the rotator cuff from the top side.  Rotator cuff from the top side looked reasonable.  There was no evidence of high-grade  partial or full-thickness tearing.  At this point, we felt that the rotator cuff could be left alone.  Attention was then at this point turned out away from the arthroscopic procedures and towards the open procedures.  A small incision was made over the distal clavicle subcutaneous tissue down to the level of the distal clavicle. The deltotrapezial fascia was opened longitudinally and the distal clavicle was identified. A 20-mm distal clavicle was excised with a saw and this piece was removed.  This was irrigated thoroughly.  The deltotrapezial fascia was closed with 1 Vicryl running and attention was then turned laterally.  A small incision was made over the lateral portal. Subcutaneous tissue down to the  level of the deltoid.  The deltoid was divided in line with its fibers and retractor put in place. We could feel the bicipital groove and a small incision was made in the bicipital groove and the biceps tendon fished out.  The arm was then straightened, the biceps put on stretch, and a guidewire was placed for a bicipital tenodesis screw.  Once this guidewire was placed, we went 25 mm proximal to this and started a Krackow-type stitch over the biceps tendon.  We then drilled a hole of 7 mm and use the Bio-Tenodesis set to put the tendon down into this hole and then locked this in place with a 7 x 25 mm Bio-Tenodesis screw.  Once this was completed, the suture that was through this screw was then tied down and this was cut after being tied down. At this point, you could easily flex, extend, and range the shoulder without any kind of problem with the biceps tendon.  This area was irrigated thoroughly, suctioned dry.  The deltoid muscle was closed with a 1 Vicryl running, the skin with 0 and 2-0 Vicryl and 3-0 Monocryl subcuticular.  The wound over the distal clavicle was also closed with 2- 0 Vicryl and 3-0 Monocryl subcuticular.  Benzoin and Steri-Strips were applied.  Sterile compressive dressing was applied, and the patient taken to recovery where she was noted to be in satisfactory condition. Estimated blood loss for the procedure was none.     Harvie Junior, M.D.     Ranae Plumber  D:  03/30/2011  T:  03/31/2011  Job:  [id number]

## 2012-03-22 ENCOUNTER — Other Ambulatory Visit: Payer: Self-pay | Admitting: Internal Medicine

## 2012-03-22 ENCOUNTER — Other Ambulatory Visit: Payer: Self-pay

## 2012-03-22 DIAGNOSIS — Z1231 Encounter for screening mammogram for malignant neoplasm of breast: Secondary | ICD-10-CM

## 2012-04-30 ENCOUNTER — Ambulatory Visit: Payer: 59

## 2012-05-06 ENCOUNTER — Encounter (HOSPITAL_COMMUNITY): Payer: Self-pay

## 2012-05-06 ENCOUNTER — Emergency Department (HOSPITAL_COMMUNITY): Payer: 59

## 2012-05-06 ENCOUNTER — Emergency Department (HOSPITAL_COMMUNITY)
Admission: EM | Admit: 2012-05-06 | Discharge: 2012-05-06 | Disposition: A | Discharge status: Home / Self Care | Payer: 59 | Attending: Emergency Medicine | Admitting: Emergency Medicine

## 2012-05-06 DIAGNOSIS — Z79899 Other long term (current) drug therapy: Secondary | ICD-10-CM | POA: Insufficient documentation

## 2012-05-06 DIAGNOSIS — R112 Nausea with vomiting, unspecified: Secondary | ICD-10-CM | POA: Insufficient documentation

## 2012-05-06 DIAGNOSIS — R1013 Epigastric pain: Secondary | ICD-10-CM | POA: Insufficient documentation

## 2012-05-06 DIAGNOSIS — F411 Generalized anxiety disorder: Secondary | ICD-10-CM | POA: Insufficient documentation

## 2012-05-06 DIAGNOSIS — F17200 Nicotine dependence, unspecified, uncomplicated: Secondary | ICD-10-CM | POA: Insufficient documentation

## 2012-05-06 DIAGNOSIS — R109 Unspecified abdominal pain: Secondary | ICD-10-CM

## 2012-05-06 DIAGNOSIS — F329 Major depressive disorder, single episode, unspecified: Secondary | ICD-10-CM | POA: Insufficient documentation

## 2012-05-06 DIAGNOSIS — F3289 Other specified depressive episodes: Secondary | ICD-10-CM | POA: Insufficient documentation

## 2012-05-06 LAB — CBC WITH DIFFERENTIAL/PLATELET
Hemoglobin: 13.8 g/dL (ref 12.0–15.0)
Lymphocytes Relative: 35 % (ref 12–46)
Lymphs Abs: 3.2 10*3/uL (ref 0.7–4.0)
MCH: 31.1 pg (ref 26.0–34.0)
Monocytes Relative: 8 % (ref 3–12)
Neutro Abs: 5.1 10*3/uL (ref 1.7–7.7)
Neutrophils Relative %: 55 % (ref 43–77)
RBC: 4.44 MIL/uL (ref 3.87–5.11)
WBC: 9.2 10*3/uL (ref 4.0–10.5)

## 2012-05-06 LAB — URINALYSIS, ROUTINE W REFLEX MICROSCOPIC
Bilirubin Urine: NEGATIVE
Hgb urine dipstick: NEGATIVE
Ketones, ur: NEGATIVE mg/dL
Nitrite: NEGATIVE
Urobilinogen, UA: 0.2 mg/dL (ref 0.0–1.0)
pH: 6 (ref 5.0–8.0)

## 2012-05-06 LAB — COMPREHENSIVE METABOLIC PANEL
ALT: 22 U/L (ref 0–35)
Alkaline Phosphatase: 85 U/L (ref 39–117)
BUN: 11 mg/dL (ref 6–23)
Chloride: 100 mEq/L (ref 96–112)
GFR calc Af Amer: >90 mL/min (ref >90)
Glucose, Bld: 98 mg/dL (ref 70–99)
Potassium: 3.7 mEq/L (ref 3.5–5.1)
Sodium: 135 mEq/L (ref 135–145)
Total Bilirubin: 0.5 mg/dL (ref 0.3–1.2)

## 2012-05-06 LAB — LIPASE, BLOOD: Lipase: 23 U/L (ref 11–59)

## 2012-05-06 LAB — POCT I-STAT TROPONIN I

## 2012-05-06 MED ORDER — DIPHENHYDRAMINE HCL 50 MG/ML IJ SOLN
25.0000 mg | Freq: Once | INTRAMUSCULAR | Status: AC
  Administered 2012-05-06: 25 mg via INTRAVENOUS
  Filled 2012-05-06: qty 1

## 2012-05-06 MED ORDER — GI COCKTAIL ~~LOC~~
30.0000 mL | Freq: Once | ORAL | Status: AC
  Administered 2012-05-06: 30 mL via ORAL
  Filled 2012-05-06: qty 30

## 2012-05-06 MED ORDER — OXYCODONE-ACETAMINOPHEN 5-325 MG PO TABS: 1.0000 | ORAL_TABLET | ORAL | Status: DC | PRN

## 2012-05-06 MED ORDER — ONDANSETRON HCL 4 MG/2ML IJ SOLN
INTRAMUSCULAR | Status: AC
  Administered 2012-05-06: 4 mg
  Filled 2012-05-06: qty 2

## 2012-05-06 MED ORDER — MORPHINE SULFATE 4 MG/ML IJ SOLN
4.0000 mg | Freq: Once | INTRAMUSCULAR | Status: AC
  Administered 2012-05-06: 4 mg via INTRAVENOUS
  Filled 2012-05-06: qty 1

## 2012-05-06 MED ORDER — HYDROMORPHONE HCL PF 1 MG/ML IJ SOLN
1.0000 mg | Freq: Once | INTRAMUSCULAR | Status: AC
  Administered 2012-05-06: 1 mg via INTRAVENOUS
  Filled 2012-05-06: qty 1

## 2012-05-06 NOTE — ED Notes (Signed)
Patient c/o epigastric pain with n/V x 3 days. Patient states she vomited dark green emesis this AM. Patient reports that the pain radiates through to the back.

## 2012-05-06 NOTE — ED Provider Notes (Signed)
History     CSN: [id number]  Arrival date & time 05/06/12  1157   First MD Initiated Contact with Patient 05/06/12 1207      Chief Complaint  Patient presents with  . Abdominal Pain  . Nausea    (Consider location/radiation/quality/duration/timing/severity/associated sxs/prior treatment) HPI Comments: Pt states that she develop epigastric pain four day ago which was initially intermittent, but is now constant:pt states that she has no history of similar symptoms:nothing makes it better or worse:pt vomited times 2 this morning:pt states that she has also had some diarrhea  Patient is a 51 y.o. female presenting with abdominal pain. The history is provided by the patient. No language interpreter was used.  Abdominal Pain Pain location:  Epigastric Pain quality: squeezing   Pain radiation: left arm. Onset quality:  Unable to specify Timing:  Constant Progression:  Worsening Chronicity:  New Context: alcohol use   Context: not eating   Relieved by:  Nothing Worsened by:  Nothing tried Ineffective treatments:  None tried Associated symptoms: nausea and vomiting   Associated symptoms: no constipation, no fever and no shortness of breath     Past Medical History  Diagnosis Date  . Anxiety   . Depression     Past Surgical History  Procedure Laterality Date  . Appendectomy    . Abdominal hysterectomy    . Wrist ganglion excision      X2 RT WRIST, X1 LT WRIST  . Incision and drainage hip      RT  . Shoulder arthroscopy      RT    Family History  Problem Relation Age of Onset  . Cancer Mother   . Cancer Father     History  Substance Use Topics  . Smoking status: Current Every Day Smoker -- 0.25 packs/day    Types: Cigarettes  . Smokeless tobacco: Never Used  . Alcohol Use: Yes     Comment: SOCIAL    OB History   Grav Para Term Preterm Abortions TAB SAB Ect Mult Living                  Review of Systems  Constitutional: Negative for fever.   Respiratory: Negative for shortness of breath.   Cardiovascular: Negative.   Gastrointestinal: Positive for nausea, vomiting and abdominal pain. Negative for constipation.    Allergies  Review of patient's allergies indicates no known allergies.  Home Medications   Current Outpatient Rx  Name  Route  Sig  Dispense  Refill  . buPROPion (WELLBUTRIN XL) 150 MG 24 hr tablet   Oral   Take 150 mg by mouth every evening.         . citalopram (CELEXA) 40 MG tablet   Oral   Take 40 mg by mouth every evening.         . clonazePAM (KLONOPIN) 1 MG tablet   Oral   Take 1 mg by mouth at bedtime.         Marland Kitchen estradiol (ESTRACE) 1 MG tablet   Oral   Take 1 mg by mouth daily.           BP 156/79  Pulse 77  Temp(Src) 97.9 F (36.6 C) (Oral)  Resp 16  SpO2 96%  Physical Exam  Nursing note and vitals reviewed. Constitutional: She is oriented to person, place, and time. She appears well-developed and well-nourished.  HENT:  Head: Normocephalic and atraumatic.  Eyes: Conjunctivae and EOM are normal.  Cardiovascular: Normal rate and regular  rhythm.   Pulmonary/Chest: Effort normal and breath sounds normal.  Abdominal: Soft. Bowel sounds are normal. There is tenderness in the epigastric area.  Musculoskeletal: Normal range of motion.  Neurological: She is alert and oriented to person, place, and time.  Skin: Skin is warm and dry.  Psychiatric: She has a normal mood and affect.    ED Course  Procedures (including critical care time)  Labs Reviewed  CBC WITH DIFFERENTIAL  COMPREHENSIVE METABOLIC PANEL  LIPASE, BLOOD  URINALYSIS, ROUTINE W REFLEX MICROSCOPIC  POCT I-STAT TROPONIN I   Dg Chest 2 View  05/06/2012  *RADIOLOGY REPORT*  Clinical Data: Epigastric pain.  CHEST - 2 VIEW  Comparison: 03/22/2012  Findings: Two views of the chest demonstrate clear lungs. Heart and mediastinum are within normal limits.  The trachea is midline and the bony thorax is intact.  There is  shortening of the lateral right clavicle which is chronic.   IMPRESSION: No active cardiopulmonary disease.   Original Report Authenticated By: Richarda Overlie, M.D.    US Abdomen Complete  05/06/2012  The *RADIOLOGY REPORT*  Clinical Data:  Abdominal pain and nausea.  COMPLETE ABDOMINAL ULTRASOUND  Comparison:  None.  Findings:  Gallbladder:  No gallstones, gallbladder wall thickening, or pericholecystic fluid. Negative sonographic Murphy's sign.  Common bile duct:  Normal.  5 mm in diameter.  Liver:  Normal.  IVC:  Normal.  Pancreas:  Normal.  Spleen:  Normal.  5.3 cm in length.  Right Kidney:  Normal.  11.6 cm in length.  Left Kidney:  Normal.  11.7 cm in length.  Abdominal aorta:  Normal.  2.2 cm maximum diameter.  IMPRESSION: Negative abdominal ultrasound.   Original Report Authenticated By: Francene Boyers, M.D.     Date: 05/06/2012  Rate: 73  Rhythm: normal sinus rhythm  QRS Axis: normal  Intervals: normal  ST/T Wave abnormalities: normal  Conduction Disutrbances:none  Narrative Interpretation:   Old EKG Reviewed: unchanged   1. Abdominal pain       MDM  Pt is feeling better at this time:doubt acs:pt is okay to follow up with gi who she is supposed to see in the next couple of weeks        Teressa Lower, NP 05/06/12 1907

## 2012-05-07 NOTE — ED Provider Notes (Signed)
Medical screening examination/treatment/procedure(s) were performed by non-physician practitioner and as supervising physician I was immediately available for consultation/collaboration.  Keyira Mondesir T Elvis Laufer, MD 05/07/12 0807 

## 2012-05-08 ENCOUNTER — Ambulatory Visit
Admission: RE | Admit: 2012-05-08 | Discharge: 2012-05-08 | Disposition: A | Discharge status: Home / Self Care | Payer: 59 | Source: Ambulatory Visit | Attending: Internal Medicine | Admitting: Internal Medicine

## 2012-05-08 DIAGNOSIS — Z1231 Encounter for screening mammogram for malignant neoplasm of breast: Secondary | ICD-10-CM

## 2013-03-04 ENCOUNTER — Encounter (HOSPITAL_COMMUNITY): Payer: Self-pay | Admitting: Pharmacist

## 2013-03-05 NOTE — H&P (Signed)
Norma [name] is an 53 y.o. female. postmenopausal posthysterectomy who presents for vulvar abscess. The patient states she has been dealing with this for quite some time. It has already required 4 separate I&D procedures. When it was drained in [place], she was returning to the ER daily for them to change the dressing; however, the abscess is sitll present. The area has remained painful to touch and with certain movements. Pt also reports increased urge- states she gets strong urge to void and occasional incontinence if does not go immediately to restroom. Denies stress incontinence. No abdominal or pelvic pain. No F/C/CP/SOB.   Current Medication:  Taking  Estradiol 1 MG Tablet 1 tablet Once a day     Wellbutrin SR 150 MG Tablet Extended Release 12 Hour 1 tablet once a day     Klonopin 1 MG Tablet 1 tablet qhs prn     Celexa 20 MG Tablet 1 tablet Once a day   Discontinued  PredniSONE 5 MG Tablet 7 tabx1d, 6tabsx1d, 5tabsx1d, 4tabx1d, 3tabx1d,2tabx1d, and 1tabx1d Once a day     Vitamin D 3 50,000 units pill 1 pill once a week     Citalopram Hydrobromide 40 MG Tablet 1 tablet Once a day     Phentermine HCl 15 MG Capsule 1 capsule Once a day     Azithromycin 250 MG Tablet 2 tablets on the first day, then 1 tablet daily for 4 days Once a day     Medication List reviewed and reconciled with the patient   Medical History:   hyperlipidemia     anxiety/depression     allergic rhinitis     insomnia     tobacco dependence     urinary urgency and incontinence and enuresis - Dr. Lorin Picket McDiarmid -February/May, 2013     hot flashes, 2013/2014   Allergies/Intolerance:   Lipitor   Gyn History:   Sexual activity currently sexually active.  Periods : partial hysterectomy.  LMP 1985.  Birth control partial hysterectomy.  Last pap smear date 2-3 years ago.  Last mammogram date 05/08/2012.   OB History:   Pregnancy # 1 live birth, vaginal delivery.  Pregnancy # 2 live birth, vaginal  delivery.   Surgical History:  Hospitalization:   No Hospitalization History.   Family History:  denies any GYN family cancer hx.  Social History:  General Tobacco use  cigarettes: Current smoker  Frequency: intermittent smoker  Smoking: yes, about 6 a day.  Alcohol: yes.  no Recreational drug use.    Past Surgical History  Procedure Laterality Date  . Appendectomy    . Abdominal hysterectomy    . Wrist ganglion excision      X2 RT WRIST, X1 LT WRIST  . Incision and drainage hip      RT  . Shoulder arthroscopy      RT  -breast reduction  Review of Systems  Constitutional: Negative for fever and chills.  Eyes: Negative.   Respiratory: Negative.   Cardiovascular: Negative for chest pain and palpitations.  Gastrointestinal: Negative for heartburn, nausea, vomiting and abdominal pain.  Genitourinary: Negative for dysuria.  Neurological: Negative for headaches.    Height 5' 5.5" (1.664 m), weight 91.627 kg (202 lb). Physical Exam  Constitutional: She is oriented to person, place, and time. She appears well-developed and well-nourished.  HENT:  Head: Normocephalic.  Neck: Normal range of motion.  Cardiovascular: Normal rate and regular rhythm.   Respiratory: Breath sounds normal.  GI: Soft. She exhibits no distension.  There is no tenderness. There is no rebound.  Genitourinary:  Erythematous right groin, upper outer labia with indurated loculated abscess ~ 2cmx2cm in size. Previous scar noted from prior I&D. Vagina - pink moist mucosa, no lesions or abnormal discharge. No other lesions appreciated..     Neurological: She is alert and oriented to person, place, and time.  Skin: Skin is warm and dry.  Psychiatric: She has a normal mood and affect.   Assessment/Plan: 53yo postmenopausal female with recurrent right vulvar abscess -Plan for I&D, possible marsupialization -NPO -Ancef to OR -SCDs to OR   [name], [name], M 03/05/2013, 5:01 PM

## 2013-03-07 ENCOUNTER — Encounter (HOSPITAL_COMMUNITY)
Admission: RE | Disposition: A | Discharge status: Home / Self Care | Payer: Self-pay | Source: Ambulatory Visit | Attending: Obstetrics & Gynecology

## 2013-03-07 ENCOUNTER — Ambulatory Visit (HOSPITAL_COMMUNITY)
Admission: RE | Admit: 2013-03-07 | Discharge: 2013-03-07 | Disposition: A | Discharge status: Home / Self Care | Payer: BC Managed Care – PPO | Source: Ambulatory Visit | Attending: Obstetrics & Gynecology | Admitting: Obstetrics & Gynecology

## 2013-03-07 ENCOUNTER — Encounter (HOSPITAL_COMMUNITY): Payer: BC Managed Care – PPO | Admitting: Anesthesiology

## 2013-03-07 ENCOUNTER — Ambulatory Visit (HOSPITAL_COMMUNITY): Payer: BC Managed Care – PPO | Admitting: Anesthesiology

## 2013-03-07 ENCOUNTER — Encounter (HOSPITAL_COMMUNITY): Payer: Self-pay | Admitting: Anesthesiology

## 2013-03-07 DIAGNOSIS — N764 Abscess of vulva: Secondary | ICD-10-CM | POA: Insufficient documentation

## 2013-03-07 DIAGNOSIS — F17200 Nicotine dependence, unspecified, uncomplicated: Secondary | ICD-10-CM | POA: Insufficient documentation

## 2013-03-07 HISTORY — PX: BARTHOLIN CYST MARSUPIALIZATION: SHX5383

## 2013-03-07 LAB — CBC
HEMATOCRIT: 38.9 % (ref 36.0–46.0)
HEMOGLOBIN: 13.2 g/dL (ref 12.0–15.0)
MCH: 31.4 pg (ref 26.0–34.0)
MCHC: 33.9 g/dL (ref 30.0–36.0)
MCV: 92.6 fL (ref 78.0–100.0)
Platelets: 228 10*3/uL (ref 150–400)
RBC: 4.2 MIL/uL (ref 3.87–5.11)
RDW: 13.2 % (ref 11.5–15.5)
WBC: 9.3 10*3/uL (ref 4.0–10.5)

## 2013-03-07 LAB — SURGICAL PCR SCREEN
MRSA, PCR: NEGATIVE
Staphylococcus aureus: NEGATIVE

## 2013-03-07 SURGERY — MARSUPIALIZATION, CYST, BARTHOLIN'S GLAND
Anesthesia: General | Site: Vulva

## 2013-03-07 MED ORDER — MEPERIDINE HCL 25 MG/ML IJ SOLN: 6.2500 mg | INTRAMUSCULAR | Status: DC | PRN

## 2013-03-07 MED ORDER — MIDAZOLAM HCL 2 MG/2ML IJ SOLN: 0.5000 mg | Freq: Once | INTRAMUSCULAR | Status: DC | PRN

## 2013-03-07 MED ORDER — KETOROLAC TROMETHAMINE 30 MG/ML IJ SOLN
INTRAMUSCULAR | Status: DC | PRN
  Administered 2013-03-07: 30 mg via INTRAVENOUS

## 2013-03-07 MED ORDER — MIDAZOLAM HCL 2 MG/2ML IJ SOLN
INTRAMUSCULAR | Status: DC | PRN
  Administered 2013-03-07: 2 mg via INTRAVENOUS

## 2013-03-07 MED ORDER — IBUPROFEN 600 MG PO TABS: 600.0000 mg | ORAL_TABLET | Freq: Four times a day (QID) | ORAL | Status: DC | PRN

## 2013-03-07 MED ORDER — OXYCODONE-ACETAMINOPHEN 5-325 MG PO TABS
ORAL_TABLET | ORAL | Status: AC
  Filled 2013-03-07: qty 1

## 2013-03-07 MED ORDER — LACTATED RINGERS IV SOLN: INTRAVENOUS | Status: DC

## 2013-03-07 MED ORDER — PROPOFOL 10 MG/ML IV BOLUS
INTRAVENOUS | Status: DC | PRN
  Administered 2013-03-07: 200 mg via INTRAVENOUS

## 2013-03-07 MED ORDER — OXYCODONE-ACETAMINOPHEN 5-325 MG PO TABS
1.0000 | ORAL_TABLET | ORAL | Status: DC | PRN
  Administered 2013-03-07: 1 via ORAL

## 2013-03-07 MED ORDER — CEFAZOLIN SODIUM-DEXTROSE 2-3 GM-% IV SOLR
2.0000 g | INTRAVENOUS | Status: AC
  Administered 2013-03-07: 2 g via INTRAVENOUS

## 2013-03-07 MED ORDER — FENTANYL CITRATE 0.05 MG/ML IJ SOLN
INTRAMUSCULAR | Status: AC
  Filled 2013-03-07: qty 2

## 2013-03-07 MED ORDER — FENTANYL CITRATE 0.05 MG/ML IJ SOLN
INTRAMUSCULAR | Status: DC | PRN
  Administered 2013-03-07: 50 ug via INTRAVENOUS
  Administered 2013-03-07: 100 ug via INTRAVENOUS
  Administered 2013-03-07 (×2): 50 ug via INTRAVENOUS

## 2013-03-07 MED ORDER — KETOROLAC TROMETHAMINE 30 MG/ML IJ SOLN
INTRAMUSCULAR | Status: AC
  Filled 2013-03-07: qty 1

## 2013-03-07 MED ORDER — MUPIROCIN 2 % EX OINT
TOPICAL_OINTMENT | CUTANEOUS | Status: AC
  Administered 2013-03-07: 1
  Filled 2013-03-07: qty 22

## 2013-03-07 MED ORDER — OXYCODONE-ACETAMINOPHEN 5-325 MG PO TABS: 1.0000 | ORAL_TABLET | Freq: Four times a day (QID) | ORAL | Status: DC | PRN

## 2013-03-07 MED ORDER — LIDOCAINE HCL 1 % IJ SOLN
INTRAMUSCULAR | Status: AC
  Filled 2013-03-07: qty 20

## 2013-03-07 MED ORDER — ONDANSETRON HCL 4 MG/2ML IJ SOLN
INTRAMUSCULAR | Status: DC | PRN
  Administered 2013-03-07: 4 mg via INTRAVENOUS

## 2013-03-07 MED ORDER — FENTANYL CITRATE 0.05 MG/ML IJ SOLN
25.0000 ug | INTRAMUSCULAR | Status: DC | PRN
  Administered 2013-03-07 (×3): 50 ug via INTRAVENOUS

## 2013-03-07 MED ORDER — LIDOCAINE HCL (CARDIAC) 20 MG/ML IV SOLN
INTRAVENOUS | Status: AC
  Filled 2013-03-07: qty 5

## 2013-03-07 MED ORDER — ONDANSETRON HCL 4 MG/2ML IJ SOLN
INTRAMUSCULAR | Status: AC
  Filled 2013-03-07: qty 2

## 2013-03-07 MED ORDER — LIDOCAINE HCL 1 % IJ SOLN
INTRAMUSCULAR | Status: DC | PRN
  Administered 2013-03-07: 4 mL

## 2013-03-07 MED ORDER — PROMETHAZINE HCL 25 MG/ML IJ SOLN: 6.2500 mg | INTRAMUSCULAR | Status: DC | PRN

## 2013-03-07 MED ORDER — PROPOFOL 10 MG/ML IV EMUL
INTRAVENOUS | Status: AC
  Filled 2013-03-07: qty 40

## 2013-03-07 MED ORDER — MIDAZOLAM HCL 2 MG/2ML IJ SOLN
INTRAMUSCULAR | Status: AC
  Filled 2013-03-07: qty 2

## 2013-03-07 MED ORDER — LACTATED RINGERS IV SOLN
INTRAVENOUS | Status: DC
  Administered 2013-03-07: 10:00:00 via INTRAVENOUS

## 2013-03-07 MED ORDER — DIPHENHYDRAMINE HCL 50 MG/ML IJ SOLN
INTRAMUSCULAR | Status: DC | PRN
  Administered 2013-03-07: 25 mg via INTRAVENOUS

## 2013-03-07 MED ORDER — KETOROLAC TROMETHAMINE 30 MG/ML IJ SOLN: 15.0000 mg | Freq: Once | INTRAMUSCULAR | Status: DC | PRN

## 2013-03-07 MED ORDER — MUPIROCIN 2 % EX OINT: TOPICAL_OINTMENT | Freq: Two times a day (BID) | CUTANEOUS | Status: DC

## 2013-03-07 MED ORDER — DEXAMETHASONE SODIUM PHOSPHATE 4 MG/ML IJ SOLN
INTRAMUSCULAR | Status: DC | PRN
  Administered 2013-03-07: 10 mg via INTRAVENOUS

## 2013-03-07 MED ORDER — LIDOCAINE HCL (CARDIAC) 20 MG/ML IV SOLN
INTRAVENOUS | Status: DC | PRN
  Administered 2013-03-07: 50 mg via INTRAVENOUS

## 2013-03-07 MED ORDER — DEXAMETHASONE SODIUM PHOSPHATE 10 MG/ML IJ SOLN
INTRAMUSCULAR | Status: AC
  Filled 2013-03-07: qty 1

## 2013-03-07 MED ORDER — DIPHENHYDRAMINE HCL 50 MG/ML IJ SOLN
INTRAMUSCULAR | Status: AC
  Filled 2013-03-07: qty 1

## 2013-03-07 MED ORDER — FENTANYL CITRATE 0.05 MG/ML IJ SOLN
INTRAMUSCULAR | Status: AC
  Filled 2013-03-07: qty 5

## 2013-03-07 SURGICAL SUPPLY — 29 items
BLADE SURG 15 STRL LF C SS BP (BLADE) ×1 IMPLANT
BLADE SURG 15 STRL SS (BLADE) ×1
BLADE SURG ROTATE 9660 (MISCELLANEOUS) ×2 IMPLANT
CLOTH BEACON ORANGE TIMEOUT ST (SAFETY) ×2 IMPLANT
DRSG TEGADERM 4X4.75 (GAUZE/BANDAGES/DRESSINGS) ×2 IMPLANT
DRSG TELFA 3X8 NADH (GAUZE/BANDAGES/DRESSINGS) ×2 IMPLANT
ELECT REM PT RETURN 9FT ADLT (ELECTROSURGICAL) ×2
ELECTRODE REM PT RTRN 9FT ADLT (ELECTROSURGICAL) ×1 IMPLANT
GAUZE PACKING IODOFORM 1/2 (PACKING) ×2 IMPLANT
GLOVE BIO SURGEON STRL SZ 6.5 (GLOVE) ×2 IMPLANT
GLOVE BIOGEL PI IND STRL 6.5 (GLOVE) ×2 IMPLANT
GLOVE BIOGEL PI IND STRL 7.0 (GLOVE) ×3 IMPLANT
GLOVE BIOGEL PI INDICATOR 6.5 (GLOVE) ×2
GLOVE BIOGEL PI INDICATOR 7.0 (GLOVE) ×3
GLOVE ECLIPSE 7.5 STRL STRAW (GLOVE) ×6 IMPLANT
GLOVE SURG SS PI 7.0 STRL IVOR (GLOVE) ×8 IMPLANT
GOWN STRL REUS W/TWL LRG LVL3 (GOWN DISPOSABLE) ×6 IMPLANT
NEEDLE HYPO 25X1 1.5 SAFETY (NEEDLE) ×2 IMPLANT
NS IRRIG 1000ML POUR BTL (IV SOLUTION) ×2 IMPLANT
PACK VAGINAL MINOR WOMEN LF (CUSTOM PROCEDURE TRAY) ×2 IMPLANT
PAD OB MATERNITY 4.3X12.25 (PERSONAL CARE ITEMS) ×2 IMPLANT
PAD PREP 24X48 CUFFED NSTRL (MISCELLANEOUS) ×2 IMPLANT
PENCIL BUTTON HOLSTER BLD 10FT (ELECTRODE) ×2 IMPLANT
SUT VIC AB 2-0 SH 27 (SUTURE) ×2
SUT VIC AB 2-0 SH 27XBRD (SUTURE) ×2 IMPLANT
SYR CONTROL 10ML LL (SYRINGE) ×2 IMPLANT
TOWEL OR 17X24 6PK STRL BLUE (TOWEL DISPOSABLE) ×4 IMPLANT
WATER STERILE IRR 1000ML POUR (IV SOLUTION) ×2 IMPLANT
YANKAUER SUCT BULB TIP NO VENT (SUCTIONS) ×2 IMPLANT

## 2013-03-07 NOTE — Interval H&P Note (Signed)
History and Physical Interval Note:  03/07/2013 9:49 AM  Norma SenterLisa C Rasheed  has presented today for surgery, with the diagnosis of VULVAR ABCESS  The various methods of treatment have been discussed with the patient and family. After consideration of risks, benefits and other options for treatment, the patient has consented to  Procedure(s): Vulvar abscess I&D with marsupialization as a surgical intervention .  The patient's history has been reviewed, patient examined, no change in status, stable for surgery.  I have reviewed the patient's chart and labs.  Questions were answered to the patient's satisfaction.     [name], Doreather Hoxworth, M

## 2013-03-07 NOTE — Anesthesia Preprocedure Evaluation (Signed)
Anesthesia Evaluation  Patient identified by MRN, date of birth, ID band Patient awake    Reviewed: Allergy & Precautions, H&P , Patient's Chart, lab work & pertinent test results, reviewed documented beta blocker date and time   History of Anesthesia Complications Negative for: history of anesthetic complications  Airway Mallampati: II TM Distance: >3 FB Neck ROM: full    Dental   Pulmonary Current Smoker,  breath sounds clear to auscultation        Cardiovascular Exercise Tolerance: Good Rhythm:regular Rate:Normal     Neuro/Psych PSYCHIATRIC DISORDERS negative psych ROS   GI/Hepatic   Endo/Other    Renal/GU      Musculoskeletal   Abdominal   Peds  Hematology   Anesthesia Other Findings   Reproductive/Obstetrics                           Anesthesia Physical Anesthesia Plan  ASA: II  Anesthesia Plan: General LMA   Post-op Pain Management:    Induction:   Airway Management Planned:   Additional Equipment:   Intra-op Plan:   Post-operative Plan:   Informed Consent: I have reviewed the patients History and Physical, chart, labs and discussed the procedure including the risks, benefits and alternatives for the proposed anesthesia with the patient or authorized representative who has indicated his/her understanding and acceptance.   Dental Advisory Given  Plan Discussed with: CRNA, Surgeon and Anesthesiologist  Anesthesia Plan Comments:         Anesthesia Quick Evaluation

## 2013-03-07 NOTE — Discharge Instructions (Addendum)
Incision and Drainage Care After Refer to this sheet in the next few weeks. These instructions provide you with information on caring for yourself after your procedure. Your caregiver may also give you more specific instructions. Your treatment has been planned according to current medical practices, but problems sometimes occur. Call your caregiver if you have any problems or questions after your procedure. HOME INSTRUCTIONS  Please note any unusual or excessive bleeding, pain, swelling. Mild dizziness or drowsiness are normal for about 24 hours after surgery.   Shower when comfortable  Restrictions: No driving for 24 hours or while taking pain medications.  Activity:  Nothing in vagina (no tampons, douching, or intercourse) x 2 weeks; no tub baths for 2 weeks   Incision: A packing and dressing is placed over the incision- if it falls out, ok to leave packing out.  You may experience slight bloody drainage from your incision periodically.  This is normal.  If you experience a large amount of drainage or the incision opens, please call your physician who will likely direct you to the emergency department.  Diet:  You may eat whatever you want.  Do not eat large meals.  Eat small frequent meals throughout the day.  Continue to drink a good amount of water at least 6-8 glasses of water per day, hydration is very important for the healing process.  Pain Management: Take Motrin and/or Percocet as prescribed/needed for pain.  Always take prescription pain medication with food, it may cause constipation, increase fluids and fiber and you may want to take an over-the-counter stool softener like Colace as needed up to 2x a day.    Alcohol -- Avoid for 24 hours and while taking pain medications.  Nausea: Take sips of ginger ale or soda  Fever -- Call physician if temperature over 101 degrees  Follow up:  If you do not already have a follow up appointment scheduled, please call the office at  [phone].  If you experience fever (a temperature greater than 100.4), pain unrelieved by pain medication, shortness of breath, swelling of a single leg, or any other symptoms which are concerning to you please the office immediately.   Do not take any ibuprofen products until 5 pm today.  You received a similar medication in the operating room.  You also received a dose of benadryl at 1100 am.

## 2013-03-07 NOTE — Transfer of Care (Signed)
Immediate Anesthesia Transfer of Care Note  Patient: Norma SenterLisa C [name]  Procedure(s) Performed: Procedure(s): BARTHOLIN CYST MARSUPIALIZATION (N/A)  Patient Location: PACU  Anesthesia Type:General  Level of Consciousness: awake, alert  and oriented  Airway & Oxygen Therapy: Patient Spontanous Breathing and Patient connected to nasal cannula oxygen  Post-op Assessment: Report given to PACU RN  Post vital signs: Reviewed  Complications: No apparent anesthesia complications

## 2013-03-07 NOTE — Preoperative (Signed)
Beta Blockers   Reason not to administer Beta Blockers:Not Applicable 

## 2013-03-07 NOTE — Anesthesia Postprocedure Evaluation (Signed)
  Anesthesia Post Note  Patient: Norma SenterLisa C [name]  Procedure(s) Performed: Procedure(s) (LRB): BARTHOLIN CYST MARSUPIALIZATION (N/A)  Anesthesia type: GA  Patient location: PACU  Post pain: Pain level controlled  Post assessment: Post-op Vital signs reviewed  Last Vitals:  Filed Vitals:   03/07/13 0933  BP: 139/87  Pulse: 95  Temp: 36.5 C  Resp: 20    Post vital signs: Reviewed  Level of consciousness: sedated  Complications: No apparent anesthesia complications

## 2013-03-08 ENCOUNTER — Encounter (HOSPITAL_COMMUNITY): Payer: Self-pay | Admitting: Obstetrics & Gynecology

## 2013-03-13 NOTE — Op Note (Signed)
Operative Report  PreOp: Right vulvar abscess PostOp: same Procedure:  Incision and drainage of vulvar abscess with marsupialization Surgeon: Dr. Myna HidalgoJennifer Nesiah Jump Anesthesia: General Complications:none EBL: less than 50 mL  Findings: Right vulva with 3cm recurrent abscess  Specimens: none  Indications: 52yo postmenopausal female who presents for recurrent right vulvar abscess.  The patient has had four separate I&D procedures in the same area.  Initially, the abscess will drain and improve;however, it continues to reform.  She had a similar experience occur on her left several years ago that per patient required her to go to the OR for "permanent removal."  Procedure: The patient was taken to the operating room where she underwent general anesthesia without difficulty. The patient was placed in a low lithotomy position using Allen stirrups. The patient was examined with the findings as noted above.  She was then prepped and draped in the normal sterile fashion. The bladder was drained using a red rubber urethral catheter.  An 15 blade was used to make a vertical incision over the abscess.  Minimal drainage of yellow/green fluid appreciated.  Using both sharp and blunt dissection, loculations were opened.  The area was copiously irrigated.  The edges were grasped with an Allis and the cyst capsule was identified.  The wall was then sutured with 2-0 vicryl in an interrupted fashion to the lateral edges.  Hemostasis was noted.  Patient tolerated the procedure without difficulty.  Myna HidalgoJennifer Aric Jost, DO [phone] (pager) [phone] (office)

## 2013-03-29 ENCOUNTER — Other Ambulatory Visit: Payer: Self-pay | Admitting: Orthopedic Surgery

## 2013-03-29 DIAGNOSIS — M5410 Radiculopathy, site unspecified: Secondary | ICD-10-CM

## 2013-03-29 DIAGNOSIS — M542 Cervicalgia: Secondary | ICD-10-CM

## 2013-03-31 ENCOUNTER — Ambulatory Visit
Admission: RE | Admit: 2013-03-31 | Discharge: 2013-03-31 | Disposition: A | Discharge status: Home / Self Care | Payer: BC Managed Care – PPO | Source: Ambulatory Visit | Attending: Orthopedic Surgery | Admitting: Orthopedic Surgery

## 2013-03-31 DIAGNOSIS — M5410 Radiculopathy, site unspecified: Secondary | ICD-10-CM

## 2013-03-31 DIAGNOSIS — M542 Cervicalgia: Secondary | ICD-10-CM

## 2013-04-09 ENCOUNTER — Encounter (HOSPITAL_COMMUNITY): Payer: Self-pay | Admitting: *Deleted

## 2013-04-09 ENCOUNTER — Encounter (HOSPITAL_COMMUNITY): Payer: Self-pay | Admitting: Pharmacy Technician

## 2013-04-09 ENCOUNTER — Other Ambulatory Visit: Payer: Self-pay | Admitting: Orthopedic Surgery

## 2013-04-09 NOTE — Progress Notes (Signed)
Pt denies SOB, chest pain, and being under the care of a cardiologist. Pt denies having a stress test, echo and cardiac cath pt made aware to Stop taking Aspirin Multivitamins, and herbal medications. Do not take any NSAIDs ie: Ibuprofen, Advil, Naproxen or any medication containing Aspirin.

## 2013-04-10 ENCOUNTER — Encounter (HOSPITAL_COMMUNITY): Payer: BC Managed Care – PPO | Admitting: Anesthesiology

## 2013-04-10 ENCOUNTER — Inpatient Hospital Stay (HOSPITAL_COMMUNITY)
Admission: RE | Admit: 2013-04-10 | Discharge: 2013-04-11 | DRG: 473 | Disposition: A | Discharge status: Home / Self Care | Payer: BC Managed Care – PPO | Source: Ambulatory Visit | Attending: Orthopedic Surgery | Admitting: Orthopedic Surgery

## 2013-04-10 ENCOUNTER — Ambulatory Visit (HOSPITAL_COMMUNITY): Payer: BC Managed Care – PPO

## 2013-04-10 ENCOUNTER — Encounter (HOSPITAL_COMMUNITY): Payer: Self-pay | Admitting: Anesthesiology

## 2013-04-10 ENCOUNTER — Ambulatory Visit (HOSPITAL_COMMUNITY): Payer: BC Managed Care – PPO | Admitting: Anesthesiology

## 2013-04-10 ENCOUNTER — Encounter (HOSPITAL_COMMUNITY)
Admission: RE | Disposition: A | Discharge status: Home / Self Care | Payer: Self-pay | Source: Ambulatory Visit | Attending: Orthopedic Surgery

## 2013-04-10 DIAGNOSIS — M5410 Radiculopathy, site unspecified: Secondary | ICD-10-CM | POA: Diagnosis present

## 2013-04-10 DIAGNOSIS — F411 Generalized anxiety disorder: Secondary | ICD-10-CM | POA: Diagnosis present

## 2013-04-10 DIAGNOSIS — M5020 Other cervical disc displacement, unspecified cervical region: Principal | ICD-10-CM | POA: Diagnosis present

## 2013-04-10 DIAGNOSIS — F17200 Nicotine dependence, unspecified, uncomplicated: Secondary | ICD-10-CM | POA: Diagnosis present

## 2013-04-10 DIAGNOSIS — F3289 Other specified depressive episodes: Secondary | ICD-10-CM | POA: Diagnosis present

## 2013-04-10 DIAGNOSIS — F329 Major depressive disorder, single episode, unspecified: Secondary | ICD-10-CM | POA: Diagnosis present

## 2013-04-10 DIAGNOSIS — Z79899 Other long term (current) drug therapy: Secondary | ICD-10-CM

## 2013-04-10 HISTORY — DX: Nausea with vomiting, unspecified: R11.2

## 2013-04-10 HISTORY — DX: Other specified postprocedural states: Z98.890

## 2013-04-10 HISTORY — PX: ANTERIOR CERVICAL DECOMP/DISCECTOMY FUSION: SHX1161

## 2013-04-10 LAB — BASIC METABOLIC PANEL
BUN: 13 mg/dL (ref 6–23)
CHLORIDE: 102 meq/L (ref 96–112)
CO2: 23 meq/L (ref 19–32)
Calcium: 8.8 mg/dL (ref 8.4–10.5)
Creatinine, Ser: 0.54 mg/dL (ref 0.50–1.10)
GFR calc Af Amer: >90 mL/min (ref >90)
GFR calc non Af Amer: >90 mL/min (ref >90)
GLUCOSE: 117 mg/dL — AB (ref 70–99)
POTASSIUM: 4 meq/L (ref 3.7–5.3)
Sodium: 138 mEq/L (ref 137–147)

## 2013-04-10 LAB — CBC
HCT: 40.6 % (ref 36.0–46.0)
HEMOGLOBIN: 13.7 g/dL (ref 12.0–15.0)
MCH: 31.6 pg (ref 26.0–34.0)
MCHC: 33.7 g/dL (ref 30.0–36.0)
MCV: 93.5 fL (ref 78.0–100.0)
Platelets: 212 10*3/uL (ref 150–400)
RBC: 4.34 MIL/uL (ref 3.87–5.11)
RDW: 13.4 % (ref 11.5–15.5)
WBC: 8.4 10*3/uL (ref 4.0–10.5)

## 2013-04-10 LAB — SURGICAL PCR SCREEN
MRSA, PCR: NEGATIVE
Staphylococcus aureus: NEGATIVE

## 2013-04-10 SURGERY — ANTERIOR CERVICAL DECOMPRESSION/DISCECTOMY FUSION 1 LEVEL
Anesthesia: General | Site: Spine Cervical

## 2013-04-10 MED ORDER — PROPOFOL 10 MG/ML IV BOLUS
INTRAVENOUS | Status: AC
  Filled 2013-04-10: qty 20

## 2013-04-10 MED ORDER — BUPIVACAINE-EPINEPHRINE 0.25% -1:200000 IJ SOLN
INTRAMUSCULAR | Status: DC | PRN
  Administered 2013-04-10: 3 mL

## 2013-04-10 MED ORDER — DIAZEPAM 5 MG PO TABS
5.0000 mg | ORAL_TABLET | Freq: Four times a day (QID) | ORAL | Status: DC | PRN
  Administered 2013-04-10: 5 mg via ORAL

## 2013-04-10 MED ORDER — ACETAMINOPHEN 650 MG RE SUPP: 650.0000 mg | RECTAL | Status: DC | PRN

## 2013-04-10 MED ORDER — FLEET ENEMA 7-19 GM/118ML RE ENEM
1.0000 | ENEMA | Freq: Once | RECTAL | Status: AC | PRN
  Filled 2013-04-10: qty 1

## 2013-04-10 MED ORDER — LACTATED RINGERS IV SOLN
INTRAVENOUS | Status: DC | PRN
  Administered 2013-04-10 (×2): via INTRAVENOUS

## 2013-04-10 MED ORDER — ROCURONIUM BROMIDE 50 MG/5ML IV SOLN
INTRAVENOUS | Status: AC
  Filled 2013-04-10: qty 1

## 2013-04-10 MED ORDER — ROCURONIUM BROMIDE 100 MG/10ML IV SOLN
INTRAVENOUS | Status: DC | PRN
  Administered 2013-04-10: 50 mg via INTRAVENOUS

## 2013-04-10 MED ORDER — ALUM & MAG HYDROXIDE-SIMETH 200-200-20 MG/5ML PO SUSP: 30.0000 mL | Freq: Four times a day (QID) | ORAL | Status: DC | PRN

## 2013-04-10 MED ORDER — HYDROMORPHONE HCL PF 1 MG/ML IJ SOLN
INTRAMUSCULAR | Status: AC
  Administered 2013-04-10: 0.5 mg via INTRAVENOUS
  Filled 2013-04-10: qty 1

## 2013-04-10 MED ORDER — DOCUSATE SODIUM 100 MG PO CAPS
100.0000 mg | ORAL_CAPSULE | Freq: Two times a day (BID) | ORAL | Status: DC
  Administered 2013-04-10: 100 mg via ORAL
  Filled 2013-04-10 (×3): qty 1

## 2013-04-10 MED ORDER — MENTHOL 3 MG MT LOZG: 1.0000 | LOZENGE | OROMUCOSAL | Status: DC | PRN

## 2013-04-10 MED ORDER — ACETAMINOPHEN 325 MG PO TABS
650.0000 mg | ORAL_TABLET | ORAL | Status: DC | PRN
  Administered 2013-04-10: 650 mg via ORAL
  Filled 2013-04-10: qty 2

## 2013-04-10 MED ORDER — ONDANSETRON HCL 4 MG/2ML IJ SOLN: 4.0000 mg | INTRAMUSCULAR | Status: DC | PRN

## 2013-04-10 MED ORDER — THROMBIN 20000 UNITS EX SOLR
CUTANEOUS | Status: DC | PRN
  Administered 2013-04-10: 13:00:00

## 2013-04-10 MED ORDER — ONDANSETRON HCL 4 MG/2ML IJ SOLN
INTRAMUSCULAR | Status: DC | PRN
  Administered 2013-04-10 (×2): 4 mg via INTRAVENOUS

## 2013-04-10 MED ORDER — CLONAZEPAM 0.5 MG PO TABS
1.0000 mg | ORAL_TABLET | Freq: Every day | ORAL | Status: DC
  Administered 2013-04-10: 1 mg via ORAL
  Filled 2013-04-10: qty 2

## 2013-04-10 MED ORDER — MIDAZOLAM HCL 5 MG/5ML IJ SOLN
INTRAMUSCULAR | Status: DC | PRN
  Administered 2013-04-10: 2 mg via INTRAVENOUS

## 2013-04-10 MED ORDER — BISACODYL 5 MG PO TBEC
5.0000 mg | DELAYED_RELEASE_TABLET | Freq: Every day | ORAL | Status: DC | PRN
  Filled 2013-04-10: qty 1

## 2013-04-10 MED ORDER — NEOSTIGMINE METHYLSULFATE 1 MG/ML IJ SOLN
INTRAMUSCULAR | Status: DC | PRN
  Administered 2013-04-10: 3 mg via INTRAVENOUS

## 2013-04-10 MED ORDER — HYDROMORPHONE HCL 2 MG PO TABS
2.0000 mg | ORAL_TABLET | Freq: Four times a day (QID) | ORAL | Status: DC | PRN
  Administered 2013-04-10 – 2013-04-11 (×2): 2 mg via ORAL
  Filled 2013-04-10 (×2): qty 1

## 2013-04-10 MED ORDER — VECURONIUM BROMIDE 10 MG IV SOLR
INTRAVENOUS | Status: AC
  Filled 2013-04-10: qty 10

## 2013-04-10 MED ORDER — DEXAMETHASONE SODIUM PHOSPHATE 4 MG/ML IJ SOLN
INTRAMUSCULAR | Status: DC | PRN
  Administered 2013-04-10: 8 mg via INTRAVENOUS

## 2013-04-10 MED ORDER — PHENOL 1.4 % MT LIQD: 1.0000 | OROMUCOSAL | Status: DC | PRN

## 2013-04-10 MED ORDER — SENNOSIDES-DOCUSATE SODIUM 8.6-50 MG PO TABS
1.0000 | ORAL_TABLET | Freq: Every evening | ORAL | Status: DC | PRN
  Filled 2013-04-10: qty 1

## 2013-04-10 MED ORDER — SODIUM CHLORIDE 0.9 % IJ SOLN: 3.0000 mL | INTRAMUSCULAR | Status: DC | PRN

## 2013-04-10 MED ORDER — LACTATED RINGERS IV SOLN
INTRAVENOUS | Status: DC
  Administered 2013-04-10: 09:00:00 via INTRAVENOUS

## 2013-04-10 MED ORDER — MUPIROCIN 2 % EX OINT
TOPICAL_OINTMENT | CUTANEOUS | Status: AC
  Administered 2013-04-10: 09:00:00
  Filled 2013-04-10: qty 22

## 2013-04-10 MED ORDER — LIDOCAINE HCL (CARDIAC) 20 MG/ML IV SOLN
INTRAVENOUS | Status: DC | PRN
  Administered 2013-04-10: 80 mg via INTRAVENOUS
  Administered 2013-04-10: 100 mg via INTRATRACHEAL

## 2013-04-10 MED ORDER — FENTANYL CITRATE 0.05 MG/ML IJ SOLN
INTRAMUSCULAR | Status: AC
  Filled 2013-04-10: qty 5

## 2013-04-10 MED ORDER — 0.9 % SODIUM CHLORIDE (POUR BTL) OPTIME
TOPICAL | Status: DC | PRN
  Administered 2013-04-10: 1000 mL

## 2013-04-10 MED ORDER — PHENYLEPHRINE 40 MCG/ML (10ML) SYRINGE FOR IV PUSH (FOR BLOOD PRESSURE SUPPORT)
PREFILLED_SYRINGE | INTRAVENOUS | Status: AC
  Filled 2013-04-10: qty 10

## 2013-04-10 MED ORDER — GLYCOPYRROLATE 0.2 MG/ML IJ SOLN
INTRAMUSCULAR | Status: AC
Start: 2013-04-10 — End: 2013-04-10
  Filled 2013-04-10: qty 2

## 2013-04-10 MED ORDER — THROMBIN 20000 UNITS EX SOLR
CUTANEOUS | Status: AC
  Filled 2013-04-10: qty 20000

## 2013-04-10 MED ORDER — NEOSTIGMINE METHYLSULFATE 1 MG/ML IJ SOLN
INTRAMUSCULAR | Status: AC
  Filled 2013-04-10: qty 10

## 2013-04-10 MED ORDER — SODIUM CHLORIDE 0.9 % IJ SOLN: 3.0000 mL | Freq: Two times a day (BID) | INTRAMUSCULAR | Status: DC

## 2013-04-10 MED ORDER — ONDANSETRON HCL 4 MG/2ML IJ SOLN
INTRAMUSCULAR | Status: AC
  Administered 2013-04-10: 4 mg
  Filled 2013-04-10: qty 2

## 2013-04-10 MED ORDER — DIPHENHYDRAMINE HCL 25 MG PO CAPS
25.0000 mg | ORAL_CAPSULE | ORAL | Status: DC | PRN
  Administered 2013-04-10 – 2013-04-11 (×2): 25 mg via ORAL
  Filled 2013-04-10 (×2): qty 1

## 2013-04-10 MED ORDER — BUPROPION HCL ER (XL) 150 MG PO TB24
150.0000 mg | ORAL_TABLET | Freq: Every evening | ORAL | Status: DC
  Administered 2013-04-10: 150 mg via ORAL
  Filled 2013-04-10 (×2): qty 1

## 2013-04-10 MED ORDER — OXYCODONE-ACETAMINOPHEN 5-325 MG PO TABS
ORAL_TABLET | ORAL | Status: AC
  Administered 2013-04-10: 2 via ORAL
  Filled 2013-04-10: qty 2

## 2013-04-10 MED ORDER — MIDAZOLAM HCL 2 MG/2ML IJ SOLN
INTRAMUSCULAR | Status: AC
  Filled 2013-04-10: qty 2

## 2013-04-10 MED ORDER — CEFAZOLIN SODIUM-DEXTROSE 2-3 GM-% IV SOLR
2.0000 g | Freq: Once | INTRAVENOUS | Status: AC
  Administered 2013-04-10: 2 g via INTRAVENOUS
  Filled 2013-04-10: qty 50

## 2013-04-10 MED ORDER — HYDROMORPHONE HCL PF 1 MG/ML IJ SOLN
0.2500 mg | INTRAMUSCULAR | Status: DC | PRN
Start: 2013-04-10 — End: 2013-04-10
  Administered 2013-04-10 (×2): 0.5 mg via INTRAVENOUS

## 2013-04-10 MED ORDER — BUPIVACAINE-EPINEPHRINE (PF) 0.25% -1:200000 IJ SOLN
INTRAMUSCULAR | Status: AC
  Filled 2013-04-10: qty 30

## 2013-04-10 MED ORDER — CITALOPRAM HYDROBROMIDE 40 MG PO TABS
40.0000 mg | ORAL_TABLET | Freq: Every evening | ORAL | Status: DC
  Administered 2013-04-10: 40 mg via ORAL
  Filled 2013-04-10 (×2): qty 1

## 2013-04-10 MED ORDER — VECURONIUM BROMIDE 10 MG IV SOLR
INTRAVENOUS | Status: DC | PRN
  Administered 2013-04-10 (×2): 1 mg via INTRAVENOUS
  Administered 2013-04-10: 2 mg via INTRAVENOUS
  Administered 2013-04-10: 1 mg via INTRAVENOUS

## 2013-04-10 MED ORDER — DIPHENHYDRAMINE HCL 50 MG/ML IJ SOLN
INTRAMUSCULAR | Status: AC
  Administered 2013-04-10: 25 mg
  Filled 2013-04-10: qty 1

## 2013-04-10 MED ORDER — FENTANYL CITRATE 0.05 MG/ML IJ SOLN
INTRAMUSCULAR | Status: DC | PRN
Start: 2013-04-10 — End: 2013-04-10
  Administered 2013-04-10 (×6): 50 ug via INTRAVENOUS
  Administered 2013-04-10 (×2): 100 ug via INTRAVENOUS

## 2013-04-10 MED ORDER — LIDOCAINE HCL (CARDIAC) 20 MG/ML IV SOLN
INTRAVENOUS | Status: AC
  Filled 2013-04-10: qty 10

## 2013-04-10 MED ORDER — ARTIFICIAL TEARS OP OINT
TOPICAL_OINTMENT | OPHTHALMIC | Status: DC | PRN
  Administered 2013-04-10: 1 via OPHTHALMIC

## 2013-04-10 MED ORDER — DIAZEPAM 5 MG PO TABS
ORAL_TABLET | ORAL | Status: AC
  Administered 2013-04-10: 5 mg via ORAL
  Filled 2013-04-10: qty 1

## 2013-04-10 MED ORDER — ESTRADIOL 1 MG PO TABS
1.0000 mg | ORAL_TABLET | Freq: Every day | ORAL | Status: DC
  Administered 2013-04-10: 1 mg via ORAL
  Filled 2013-04-10 (×2): qty 1

## 2013-04-10 MED ORDER — STERILE WATER FOR INJECTION IJ SOLN
INTRAMUSCULAR | Status: AC
  Filled 2013-04-10: qty 10

## 2013-04-10 MED ORDER — CEFAZOLIN SODIUM 1-5 GM-% IV SOLN
1.0000 g | Freq: Three times a day (TID) | INTRAVENOUS | Status: AC
  Administered 2013-04-10 – 2013-04-11 (×2): 1 g via INTRAVENOUS
  Filled 2013-04-10 (×2): qty 50

## 2013-04-10 MED ORDER — PROPOFOL 10 MG/ML IV BOLUS
INTRAVENOUS | Status: DC | PRN
  Administered 2013-04-10: 170 mg via INTRAVENOUS

## 2013-04-10 MED ORDER — GLYCOPYRROLATE 0.2 MG/ML IJ SOLN
INTRAMUSCULAR | Status: DC | PRN
  Administered 2013-04-10: 0.4 mg via INTRAVENOUS

## 2013-04-10 MED ORDER — MENTHOL 3 MG MT LOZG
LOZENGE | OROMUCOSAL | Status: AC
  Filled 2013-04-10: qty 9

## 2013-04-10 MED ORDER — PHENYLEPHRINE HCL 10 MG/ML IJ SOLN
INTRAMUSCULAR | Status: DC | PRN
  Administered 2013-04-10 (×5): 40 ug via INTRAVENOUS

## 2013-04-10 MED ORDER — MORPHINE SULFATE 2 MG/ML IJ SOLN: 1.0000 mg | INTRAMUSCULAR | Status: DC | PRN

## 2013-04-10 MED ORDER — OXYCODONE-ACETAMINOPHEN 5-325 MG PO TABS
1.0000 | ORAL_TABLET | ORAL | Status: DC | PRN
  Administered 2013-04-10: 2 via ORAL

## 2013-04-10 SURGICAL SUPPLY — 72 items
BENZOIN TINCTURE PRP APPL 2/3 (GAUZE/BANDAGES/DRESSINGS) ×2 IMPLANT
BIT DRILL NEURO 2X3.1 SFT TUCH (MISCELLANEOUS) ×1 IMPLANT
BIT DRILL SRG 14X2.2XFLT CHK (BIT) ×1 IMPLANT
BIT DRL SRG 14X2.2XFLT CHK (BIT) ×1
BLADE SURG 15 STRL LF DISP TIS (BLADE) ×1 IMPLANT
BLADE SURG 15 STRL SS (BLADE) ×1
BLADE SURG ROTATE 9660 (MISCELLANEOUS) ×2 IMPLANT
BUR MATCHSTICK NEURO 3.0 LAGG (BURR) ×2 IMPLANT
CARTRIDGE OIL MAESTRO DRILL (MISCELLANEOUS) ×1 IMPLANT
CLSR STERI-STRIP ANTIMIC 1/2X4 (GAUZE/BANDAGES/DRESSINGS) ×2 IMPLANT
COLLAR CERV LO CONTOUR FIRM DE (SOFTGOODS) ×2 IMPLANT
CORDS BIPOLAR (ELECTRODE) ×2 IMPLANT
COVER SURGICAL LIGHT HANDLE (MISCELLANEOUS) ×2 IMPLANT
CRADLE DONUT ADULT HEAD (MISCELLANEOUS) ×2 IMPLANT
DIFFUSER DRILL AIR PNEUMATIC (MISCELLANEOUS) ×2 IMPLANT
DRAIN JACKSON RD 7FR 3/32 (WOUND CARE) IMPLANT
DRAPE C-ARM 42X72 X-RAY (DRAPES) ×2 IMPLANT
DRAPE POUCH INSTRU U-SHP 10X18 (DRAPES) ×2 IMPLANT
DRAPE SURG 17X23 STRL (DRAPES) ×6 IMPLANT
DRILL BIT SKYLINE 14MM (BIT) ×1
DRILL NEURO 2X3.1 SOFT TOUCH (MISCELLANEOUS) ×2
DURAPREP 26ML APPLICATOR (WOUND CARE) ×2 IMPLANT
ELECT COATED BLADE 2.86 ST (ELECTRODE) ×2 IMPLANT
ELECT REM PT RETURN 9FT ADLT (ELECTROSURGICAL) ×2
ELECTRODE REM PT RTRN 9FT ADLT (ELECTROSURGICAL) ×1 IMPLANT
ENDOSKELETON IMPLANT MED 6M-0 (Orthopedic Implant) ×2 IMPLANT
EVACUATOR SILICONE 100CC (DRAIN) IMPLANT
GAUZE SPONGE 4X4 16PLY XRAY LF (GAUZE/BANDAGES/DRESSINGS) ×2 IMPLANT
GLOVE BIO SURGEON STRL SZ7 (GLOVE) ×2 IMPLANT
GLOVE BIO SURGEON STRL SZ8 (GLOVE) ×2 IMPLANT
GLOVE BIOGEL PI IND STRL 7.0 (GLOVE) ×2 IMPLANT
GLOVE BIOGEL PI IND STRL 8 (GLOVE) ×1 IMPLANT
GLOVE BIOGEL PI INDICATOR 7.0 (GLOVE) ×2
GLOVE BIOGEL PI INDICATOR 8 (GLOVE) ×1
GOWN STRL REUS W/ TWL LRG LVL3 (GOWN DISPOSABLE) ×1 IMPLANT
GOWN STRL REUS W/ TWL XL LVL3 (GOWN DISPOSABLE) ×1 IMPLANT
GOWN STRL REUS W/TWL LRG LVL3 (GOWN DISPOSABLE) ×1
GOWN STRL REUS W/TWL XL LVL3 (GOWN DISPOSABLE) ×1
IV CATH 14GX2 1/4 (CATHETERS) ×2 IMPLANT
KIT BASIN OR (CUSTOM PROCEDURE TRAY) ×2 IMPLANT
KIT ROOM TURNOVER OR (KITS) ×2 IMPLANT
MANIFOLD NEPTUNE II (INSTRUMENTS) ×2 IMPLANT
NEEDLE 27GAX1X1/2 (NEEDLE) ×2 IMPLANT
NEEDLE SPNL 20GX3.5 QUINCKE YW (NEEDLE) ×2 IMPLANT
NS IRRIG 1000ML POUR BTL (IV SOLUTION) ×2 IMPLANT
OIL CARTRIDGE MAESTRO DRILL (MISCELLANEOUS) ×2
PACK ORTHO CERVICAL (CUSTOM PROCEDURE TRAY) ×2 IMPLANT
PAD ARMBOARD 7.5X6 YLW CONV (MISCELLANEOUS) ×4 IMPLANT
PATTIES SURGICAL .5 X.5 (GAUZE/BANDAGES/DRESSINGS) ×2 IMPLANT
PATTIES SURGICAL .5 X1 (DISPOSABLE) ×2 IMPLANT
PIN DISTRACTION 14 (PIN) ×4 IMPLANT
PLATE ONE LEVEL SKYLINE 14MM (Plate) ×2 IMPLANT
PUTTY BONE DBX 2.5 MIS (Bone Implant) ×2 IMPLANT
SCREW VAR SELF TAP SKYLINE 14M (Screw) ×8 IMPLANT
SPONGE GAUZE 4X4 12PLY (GAUZE/BANDAGES/DRESSINGS) ×2 IMPLANT
SPONGE INTESTINAL PEANUT (DISPOSABLE) ×2 IMPLANT
SPONGE SURGIFOAM ABS GEL 100 (HEMOSTASIS) IMPLANT
STRIP CLOSURE SKIN 1/2X4 (GAUZE/BANDAGES/DRESSINGS) ×2 IMPLANT
SURGIFLO TRUKIT (HEMOSTASIS) IMPLANT
SUT MNCRL AB 4-0 PS2 18 (SUTURE) IMPLANT
SUT SILK 4 0 (SUTURE)
SUT SILK 4-0 18XBRD TIE 12 (SUTURE) IMPLANT
SUT VIC AB 2-0 CT2 18 VCP726D (SUTURE) ×2 IMPLANT
SYR BULB IRRIGATION 50ML (SYRINGE) ×2 IMPLANT
SYR CONTROL 10ML LL (SYRINGE) ×4 IMPLANT
TAPE CLOTH 4X10 WHT NS (GAUZE/BANDAGES/DRESSINGS) ×2 IMPLANT
TAPE CLOTH SURG 4X10 WHT LF (GAUZE/BANDAGES/DRESSINGS) ×2 IMPLANT
TAPE UMBILICAL COTTON 1/8X30 (MISCELLANEOUS) ×2 IMPLANT
TOWEL OR 17X24 6PK STRL BLUE (TOWEL DISPOSABLE) ×2 IMPLANT
TOWEL OR 17X26 10 PK STRL BLUE (TOWEL DISPOSABLE) ×2 IMPLANT
WATER STERILE IRR 1000ML POUR (IV SOLUTION) ×2 IMPLANT
YANKAUER SUCT BULB TIP NO VENT (SUCTIONS) ×2 IMPLANT

## 2013-04-10 NOTE — H&P (Signed)
PREOPERATIVE H&P  Chief Complaint: right arm pain  HPI: Norma [name] is a 53 y.o. female who presents with 6 weeks of progressive right arm pain and progressive weakness. Patient failed PT and ESIs. 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   Past Medical History  Diagnosis Date  . Anxiety   . Depression   . PONV (postoperative nausea and vomiting)    Past Surgical History  Procedure Laterality Date  . Appendectomy    . Abdominal hysterectomy    . Wrist ganglion excision      X2 RT WRIST, X1 LT WRIST  . Incision and drainage hip      RT  . Shoulder arthroscopy      RT  . Bartholin cyst marsupialization N/A 03/07/2013    Procedure: BARTHOLIN CYST MARSUPIALIZATION;  Surgeon: Sharon SellerJennifer M Ozan, DO;  Location: [facility];  Service: Gynecology;  Laterality: N/A;  . Colonoscopy     History   Social History  . Marital Status: Married    Spouse Name: N/A    Number of Children: N/A  . Years of Education: N/A   Social History Main Topics  . Smoking status: Current Every Day Smoker -- 0.25 packs/day    Types: Cigarettes  . Smokeless tobacco: Never Used  . Alcohol Use: Yes     Comment: SOCIAL  . Drug Use: No  . Sexual Activity: None   Other Topics Concern  . None   Social History Narrative  . None   Family History  Problem Relation Age of Onset  . Cancer Mother   . Cancer Father    Allergies  Allergen Reactions  . Other Itching and Rash    Chlorhexidine wipes - rash all over body   Prior to Admission medications   Medication Sig Start Date End Date Taking? Authorizing Provider  buPROPion (WELLBUTRIN XL) 150 MG 24 hr tablet Take 150 mg by mouth every evening.   Yes Historical Provider, MD  citalopram (CELEXA) 40 MG tablet Take 40 mg by mouth every evening.   Yes Historical Provider, MD  clonazePAM (KLONOPIN) 1 MG tablet Take 1 mg by mouth at bedtime.   Yes Historical Provider, MD  estradiol (ESTRACE) 1 MG tablet Take 1 mg by mouth daily.   Yes  Historical Provider, MD  Multiple Vitamin (MULTIVITAMIN WITH MINERALS) TABS tablet Take 1 tablet by mouth daily.   Yes Historical Provider, MD     All other systems have been reviewed and were otherwise negative with the exception of those mentioned in the HPI and as above.  Physical Exam: There were no vitals filed for this visit.  General: Alert, no acute distress Cardiovascular: No pedal edema Respiratory: No cyanosis, no use of accessory musculature Skin: No lesions in the area of chief complaint Neurologic: Sensation intact distally Psychiatric: Patient is competent for consent with normal mood and affect Lymphatic: No axillary or cervical lymphadenopathy  MUSCULOSKELETAL: + spurling on right  Assessment/Plan: Right arm pain Plan for Procedure(s): ANTERIOR CERVICAL DECOMPRESSION/DISCECTOMY FUSION 1 LEVEL   Emilee HeroUMONSKI,Kip Cropp LEONARD, MD 04/10/2013 8:07 AM

## 2013-04-10 NOTE — Progress Notes (Signed)
Pt requesting something for her nerves. Spoke with Dr Randa EvensEdwards about giving her something, states that she wants her to wait to see the CRNA so she will be able to answer questions.  Pt informed.

## 2013-04-10 NOTE — Anesthesia Procedure Notes (Signed)
Procedure Name: Intubation Date/Time: 04/10/2013 12:49 PM Performed by: Lovie CholOCK, Isolde Skaff K Pre-anesthesia Checklist: Patient identified, Emergency Drugs available, Suction available, Patient being monitored and Timeout performed Patient Re-evaluated:Patient Re-evaluated prior to inductionOxygen Delivery Method: Circle system utilized Preoxygenation: Pre-oxygenation with 100% oxygen Intubation Type: IV induction Ventilation: Mask ventilation without difficulty Laryngoscope Size: Miller and 2 Grade View: Grade I Tube type: Oral Tube size: 7.0 mm Number of attempts: 1 Airway Equipment and Method: Stylet Placement Confirmation: ETT inserted through vocal cords under direct vision,  positive ETCO2,  CO2 detector and breath sounds checked- equal and bilateral Secured at: 21 cm Tube secured with: Tape Dental Injury: Teeth and Oropharynx as per pre-operative assessment

## 2013-04-10 NOTE — Anesthesia Preprocedure Evaluation (Addendum)
Anesthesia Evaluation  Patient identified by MRN, date of birth, ID band Patient awake    Reviewed: Allergy & Precautions, H&P , NPO status , Patient's Chart, lab work & pertinent test results  Airway Mallampati: II      Dental   Pulmonary Current Smoker,  breath sounds clear to auscultation        Cardiovascular negative cardio ROS  Rhythm:Regular Rate:Normal     Neuro/Psych    GI/Hepatic negative GI ROS, Neg liver ROS,   Endo/Other  negative endocrine ROS  Renal/GU negative Renal ROS     Musculoskeletal   Abdominal   Peds  Hematology   Anesthesia Other Findings   Reproductive/Obstetrics                          Anesthesia Physical Anesthesia Plan  ASA: II  Anesthesia Plan: General   Post-op Pain Management:    Induction: Intravenous  Airway Management Planned: Oral ETT  Additional Equipment:   Intra-op Plan:   Post-operative Plan: Extubation in OR  Informed Consent: I have reviewed the patients History and Physical, chart, labs and discussed the procedure including the risks, benefits and alternatives for the proposed anesthesia with the patient or authorized representative who has indicated his/her understanding and acceptance.   Dental advisory given  Plan Discussed with: CRNA, Anesthesiologist and Surgeon  Anesthesia Plan Comments:         Anesthesia Quick Evaluation

## 2013-04-10 NOTE — Op Note (Signed)
Norma [name], Norma [name]                  ACCOUNT NO.:  [id number]  MEDICAL RECORD NO.:  [id number]  LOCATION:  3C08C                        FACILITY:  [facility]  PHYSICIAN:  Estill Bamberg, MD      DATE OF BIRTH:  1960-05-31  DATE OF PROCEDURE:  04/10/2013                              OPERATIVE REPORT   PREOPERATIVE DIAGNOSES: 1. Right-sided C7 radiculopathy. 2. 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  POSTOPERATIVE DIAGNOSES: 1. Right-sided C7 radiculopathy. 2. 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  PROCEDURE: 1. Anterior cervical decompression and fusion C6-7. 2. Placement of anterior instrumentation, C6-7. 3. Insertion of interbody device x1 (6 mm parallel tightened interbody     spacer, medium). 4. Use of morselized allograft (DBS mix). 5. Intraoperative use of fluoroscopy.  SURGEON:  Estill Bamberg, MD  ASSISTANT:  Jason Coop, [facility]  ANESTHESIA:  General endotracheal anesthesia.  COMPLICATIONS:  None.  DISPOSITION:  Stable.  ESTIMATED BLOOD LOSS:  Minimal.  INDICATIONS FOR PROCEDURE:  Briefly, Norma [name] is a very pleasant 53- year-old right-hand-dominant female who did present to me with severe and ongoing and debilitating pain in her right arm for the last 6 weeks. She did not have increasing weakness.  She did not fail conservative care including physical therapy and injections.  I did review an MRI, which was notable for moderate-to-large sized C6-7 disk herniation, clearly causing compression to the right C7 nerve.  The patient did wish to proceed with surgery.  OPERATIVE DETAILS:  On April 10, 2013, the patient was brought to surgery and general endotracheal anesthesia was administered.  The patient was placed supine on the hospital bed.  The patient's arms were secured to her sides and all bony prominences were padded.  The neck was gently extended.  The neck was prepped and draped in the usual sterile fashion and the time-out procedure was performed.  Antibiotics  were given.  I then made a left-sided transverse incision in line with the C6- 7 interspace.  The platysma was sharply incised.  The carotid artery was palpated and retracted laterally.  The trachea and esophagus were retracted medially.  The anterior cervical spine was readily noted.  I then obtained a lateral intraoperative fluoroscopic view to confirm the appropriate operative level.  There were prominent osteophytes noted anteriorly and these were removed with a rongeur.  I then placed a self- retaining Shadow-Line retractor.  Caspar pins were placed into the C6 and C7 vertebral bodies and distraction was applied.  I then used a 15- blade knife to perform an annulotomy anteriorly.  I then performed a thorough and complete diskectomy.  The posterior longitudinal ligament was identified and entered using a nerve hook.  #1 followed by #2 Kerrison was used to remove the poster longitudinal ligament on the left to the right side of the spinal canal.  Of note, using a nerve hook, I was readily evident that there was substantial compression on the exiting C7 nerve on the right.  There was a protruding disk fragment. The disk protrusion was adequately removed using a #1 followed by #2 Kerrison.  I was able to fully and entirely decompress the neural foramen on the right side.  I was very  pleased with the decompression that was performed.  The endplates were then gently prepared using a series of curettes.  I then placed a series of trials.  I did feel the 6 mm trial will be the most appropriate fit.  A 6 mm interbody spacer was packed with DBX mix and tamped into position in the usual fashion. Periods very pleased with the press fit of the implant.  I then applied a 14-mm anterior cervical plate over the anterior cervical spine.  A 14 mm volar variable angle screws were placed, to in each vertebral body at C6 and C7 for a total of 4 screws.  Again, locking mechanism was then used.  I then  copiously irrigated the wound.  I was very pleased with the final AP and lateral fluoroscopic images.  At this point, the platysma was closed using 2-0 Vicryl.  The skin was then closed using 3- 0 Monocryl.  Benzoin and Steri-Strips were then applied followed by sterile dressing.  A soft collar was placed.  The patient was then awoken from general endotracheal anesthesia and transferred to recovery in stable condition.  Jason CoopKayla McKenzie, PAC was my assistant throughout surgery and did aid in retraction, suctioning, and closure.     Estill BambergMark Yahayra Geis, MD     MD/MEDQ  D:  04/10/2013  T:  04/10/2013  Job:  [id number]

## 2013-04-10 NOTE — Transfer of Care (Signed)
Immediate Anesthesia Transfer of Care Note  Patient: Norma SenterLisa C [name]  Procedure(s) Performed: Procedure(s) with comments: ANTERIOR CERVICAL DECOMPRESSION/DISCECTOMY FUSION 1 LEVEL (N/A) - Anterior cervical decompression fusion, cervical 6-7 with instrumentation and allograft  Patient Location: PACU  Anesthesia Type:General  Level of Consciousness: awake, alert , oriented and patient cooperative  Airway & Oxygen Therapy: Patient Spontanous Breathing and Patient connected to nasal cannula oxygen  Post-op Assessment: Report given to PACU RN and Post -op Vital signs reviewed and stable  Post vital signs: Reviewed  Complications: No apparent anesthesia complications

## 2013-04-10 NOTE — Progress Notes (Signed)
Cbc,bmet done as odrered pre dr Randa Evensedwards.

## 2013-04-10 NOTE — Preoperative (Signed)
Beta Blockers   Reason not to administer Beta Blockers:Not Applicable 

## 2013-04-10 NOTE — Anesthesia Postprocedure Evaluation (Signed)
  Anesthesia Post-op Note  Patient: Norma SenterLisa C [name]  Procedure(s) Performed: Procedure(s) with comments: ANTERIOR CERVICAL DECOMPRESSION/DISCECTOMY FUSION 1 LEVEL (N/A) - Anterior cervical decompression fusion, cervical 6-7 with instrumentation and allograft  Patient Location: PACU  Anesthesia Type:General  Level of Consciousness: awake and alert   Airway and Oxygen Therapy: Patient Spontanous Breathing  Post-op Pain: mild  Post-op Assessment: Post-op Vital signs reviewed  Post-op Vital Signs: stable  Complications: No apparent anesthesia complications

## 2013-04-11 MED ORDER — HYDROMORPHONE HCL 2 MG PO TABS: 2.0000 mg | ORAL_TABLET | Freq: Four times a day (QID) | ORAL | Status: DC | PRN

## 2013-04-11 NOTE — Discharge Summary (Signed)
Patient ID: Norma [name] MRN: [id number] DOB/AGE: 04-21-1960 53 y.o.  Admit date: 04/10/2013 Discharge date: 04/11/2013  Admission Diagnoses:  Active Problems:   Radiculopathy   Discharge Diagnoses:  Same  Past Medical History  Diagnosis Date  . Anxiety   . Depression   . PONV (postoperative nausea and vomiting)     Surgeries: Procedure(s): ANTERIOR CERVICAL DECOMPRESSION/DISCECTOMY FUSION 1 LEVEL C6-7 on 04/10/2013  Discharged Condition: Improved  Hospital Course: Norma [name] is an 53 y.o. female who was admitted 04/10/2013 for operative treatment of radiculopathy. Patient has severe unremitting pain that affects sleep, daily activities, and work/hobbies. After pre-op clearance the patient was taken to the operating room on 04/10/2013 and underwent  Procedure(s): ANTERIOR CERVICAL DECOMPRESSION/DISCECTOMY FUSION 1 LEVEL C6-7.    Patient was given perioperative antibiotics: Anti-infectives   Start     Dose/Rate Route Frequency Ordered Stop   04/10/13 2100  ceFAZolin (ANCEF) IVPB 1 g/50 mL premix     1 g 100 mL/hr over 30 Minutes Intravenous Every 8 hours 04/10/13 1759 04/11/13 0439   04/10/13 1315  ceFAZolin (ANCEF) IVPB 2 g/50 mL premix     2 g 100 mL/hr over 30 Minutes Intravenous  Once 04/10/13 1303 04/10/13 1250       Patient was given sequential compression devices, early ambulation to prevent DVT.  Patient benefited maximally from hospital stay and there were no complications.    Recent vital signs: Patient Vitals for the past 24 hrs:  BP Temp Temp src Pulse Resp SpO2  04/11/13 0827 122/62 mmHg 98 F (36.7 C) - 82 16 94 %  04/11/13 0403 124/72 mmHg 97.8 F (36.6 C) Oral 80 16 94 %  04/10/13 2331 129/89 mmHg 97.7 F (36.5 C) Oral 101 18 93 %  04/10/13 1953 111/56 mmHg 97.7 F (36.5 C) Oral 95 18 94 %  04/10/13 1745 138/70 mmHg 97.5 F (36.4 C) - 94 20 93 %  04/10/13 1700 131/73 mmHg 98.2 F (36.8 C) - 106 20 96 %  04/10/13 1645 134/68 mmHg - - 100 22 95 %   04/10/13 1630 128/88 mmHg - - 100 20 95 %  04/10/13 1615 141/69 mmHg - - 91 16 96 %     Recent laboratory studies:  Recent Labs  04/10/13 0853  WBC 8.4  HGB 13.7  HCT 40.6  PLT 212  NA 138  K 4.0  CL 102  CO2 23  BUN 13  CREATININE 0.54  GLUCOSE 117*  CALCIUM 8.8     Discharge Medications:     Medication List         buPROPion 150 MG 24 hr tablet  Commonly known as:  WELLBUTRIN XL  Take 150 mg by mouth every evening.     citalopram 40 MG tablet  Commonly known as:  CELEXA  Take 40 mg by mouth every evening.     clonazePAM 1 MG tablet  Commonly known as:  KLONOPIN  Take 1 mg by mouth at bedtime.     estradiol 1 MG tablet  Commonly known as:  ESTRACE  Take 1 mg by mouth daily.     HYDROmorphone 2 MG tablet  Commonly known as:  DILAUDID  Take 1 tablet (2 mg total) by mouth every 6 (six) hours as needed for severe pain.     multivitamin with minerals Tabs tablet  Take 1 tablet by mouth daily.        Diagnostic Studies: Dg Cervical Spine 1 View  04/10/2013  CLINICAL DATA:  C6-7 ACDF  EXAM: DG CERVICAL SPINE - 1 VIEW  COMPARISON:  03/31/2013  FINDINGS: Single lateral view shows anterior cervical discectomy and fusion at C6-7. Interbody fusion material is in place. Anterior plate with screw fixation. Detail is limited by shoulder density. Sponges remain along the operative approach.  IMPRESSION: ACDF C6-7.   Electronically Signed   By: Paulina FusiMark  Shogry M.D.   On: 04/10/2013 16:47   Mr Cervical Spine Wo Contrast  03/31/2013   CLINICAL DATA:  Right shoulder pain radiating into the arm with tingling. Present for 6 months.  EXAM: MRI CERVICAL SPINE WITHOUT CONTRAST  TECHNIQUE: Multiplanar, multisequence MR imaging was performed. No intravenous contrast was administered.  COMPARISON:  None.  FINDINGS: There is straightening of the normal cervical lordosis. There is no listhesis. Vertebral body heights are maintained. Mild multilevel disc space narrowing is present in the  mid and lower cervical spine. Mild edema in the C6 greater than C7 vertebral bodies adjacent to the C6-7 disc space is likely degenerative. The craniocervical junction is unremarkable. The cervical spinal cord is normal in caliber and signal. The visualized paraspinal soft tissues are unremarkable.  C2-3:  Moderate left facet arthrosis without stenosis.  C3-4: Moderate left greater than right facet arthrosis without stenosis.  C4-5: Mild right and moderate left facet arthrosis and minimal uncovertebral hypertrophy result in mild left neural foraminal narrowing. No spinal canal stenosis.  C5-6: Shallow broad-based posterior disc osteophyte complex and mild facet arthrosis without stenosis.  C6-7: Large, broad-based posterior disc osteophyte complex eccentric to the right and mild facet hypertrophy result in moderate right neural foraminal stenosis and mild spinal stenosis.  C7-T1:  Moderate facet hypertrophy without stenosis.  IMPRESSION: Multilevel degenerative disc disease and facet arthrosis, greatest at C6-7 where there is moderate right neural foraminal stenosis and mild spinal stenosis.   Electronically Signed   By: Sebastian AcheAllen  Grady   On: 03/31/2013 16:31    Disposition: 01-Home or Self Care      Discharge Orders   Future Orders Complete By Expires   Discharge patient  As directed        S/p C6/7 ACDF, doing great  - d/c home today with soft collar, valium, dilaudid  - f/u 2 weeks  Signed: Georga BoraMCKENZIE, Sayid Moll J 04/11/2013, 4:11 PM

## 2013-04-11 NOTE — Progress Notes (Signed)
Pt doing very well. Pt given D/C instructions with Rx's, verbal understanding of teaching was given. Pt D/C'd home via wheelchair @ [phone] with husband per MD order. Pt is stable at D/C and has no other needs. Rema FendtAshley Marnee Sherrard, RN

## 2013-04-11 NOTE — Progress Notes (Signed)
Patient reports resolution of right arm pain. Minimal neck discomfort.   BP 124/72  Pulse 80  Temp(Src) 97.8 F (36.6 C) (Oral)  Resp 16  Ht 5' 5.5" (1.664 m)  Wt 86.183 kg (190 lb)  BMI 31.13 kg/m2  SpO2 94%  Soft collar appropriately applied NVI  S/p C6/7 ACDF, doing great  - d/c home today with soft collar, valium, dilaudid - f/u 2 weeks

## 2013-04-12 ENCOUNTER — Encounter (HOSPITAL_COMMUNITY): Payer: Self-pay | Admitting: Orthopedic Surgery

## 2013-09-30 ENCOUNTER — Emergency Department (INDEPENDENT_AMBULATORY_CARE_PROVIDER_SITE_OTHER)
Admission: EM | Admit: 2013-09-30 | Discharge: 2013-09-30 | Disposition: A | Discharge status: Home / Self Care | Payer: BC Managed Care – PPO | Source: Home / Self Care | Attending: Emergency Medicine | Admitting: Emergency Medicine

## 2013-09-30 ENCOUNTER — Encounter (HOSPITAL_COMMUNITY): Payer: Self-pay | Admitting: Emergency Medicine

## 2013-09-30 ENCOUNTER — Emergency Department (INDEPENDENT_AMBULATORY_CARE_PROVIDER_SITE_OTHER): Payer: BC Managed Care – PPO

## 2013-09-30 DIAGNOSIS — S92919A Unspecified fracture of unspecified toe(s), initial encounter for closed fracture: Secondary | ICD-10-CM

## 2013-09-30 DIAGNOSIS — S92912A Unspecified fracture of left toe(s), initial encounter for closed fracture: Secondary | ICD-10-CM

## 2013-09-30 DIAGNOSIS — IMO0002 Reserved for concepts with insufficient information to code with codable children: Secondary | ICD-10-CM

## 2013-09-30 MED ORDER — TRAMADOL HCL 50 MG PO TABS: 100.0000 mg | ORAL_TABLET | Freq: Four times a day (QID) | ORAL | Status: DC | PRN

## 2013-09-30 NOTE — ED Provider Notes (Signed)
Medical screening examination/treatment/procedure(s) were performed by non-physician practitioner and as supervising physician I was immediately available for consultation/collaboration.  Leslee Home, M.D.  Reuben Likes, MD 09/30/13 2108

## 2013-09-30 NOTE — ED Notes (Signed)
C/o left foot pain x past 5 days

## 2013-09-30 NOTE — ED Provider Notes (Signed)
CSN: [id number]     Arrival date & time 09/30/13  1813 History   First MD Initiated Contact with Patient 09/30/13 1835     Chief Complaint  Patient presents with  . Foot Pain   (Consider location/radiation/quality/duration/timing/severity/associated sxs/prior Treatment) HPI Comments: 53 year old female presents complaining of a right-sided earache for about 2 days, and left foot pain. The earache has been intermittent and there are no associated symptoms such as pain in the contralateral ear, congestion, rhinorrhea, sore throat, fever, chills. The left foot has been hurting for about 3 days. She was walking when she kicked the back of a suitcase. Since then she has had pain and swelling at the base of her little toe on the left foot. The pain gets worse throughout the day and is not as bad first thing in the morning when she gets up. No numbness in the foot. Denies any other injury. She is taking ibuprofen without relief.   Past Medical History  Diagnosis Date  . Anxiety   . Depression   . PONV (postoperative nausea and vomiting)    Past Surgical History  Procedure Laterality Date  . Appendectomy    . Abdominal hysterectomy    . Wrist ganglion excision      X2 RT WRIST, X1 LT WRIST  . Incision and drainage hip      RT  . Shoulder arthroscopy      RT  . Bartholin cyst marsupialization N/A 03/07/2013    Procedure: BARTHOLIN CYST MARSUPIALIZATION;  Surgeon: Sharon Seller, DO;  Location: [facility];  Service: Gynecology;  Laterality: N/A;  . Colonoscopy    . Anterior cervical decomp/discectomy fusion N/A 04/10/2013    Procedure: ANTERIOR CERVICAL DECOMPRESSION/DISCECTOMY FUSION 1 LEVEL;  Surgeon: Emilee Hero, MD;  Location: [facility] OR;  Service: Orthopedics;  Laterality: N/A;  Anterior cervical decompression fusion, cervical 6-7 with instrumentation and allograft   Family History  Problem Relation Age of Onset  . Cancer Mother   . Cancer Father    History  Substance Use Topics  .  Smoking status: Current Every Day Smoker -- 0.25 packs/day    Types: Cigarettes  . Smokeless tobacco: Never Used  . Alcohol Use: Yes     Comment: SOCIAL   OB History   Grav Para Term Preterm Abortions TAB SAB Ect Mult Living                 Review of Systems  Constitutional: Negative for fever and chills.  HENT: Positive for ear pain. Negative for congestion, ear discharge, nosebleeds, postnasal drip, rhinorrhea, sinus pressure and sore throat.   Musculoskeletal:       Left foot pain, see history of present illness  All other systems reviewed and are negative.   Allergies  Codeine and Other  Home Medications   Prior to Admission medications   Medication Sig Start Date End Date Taking? Authorizing Provider  buPROPion (WELLBUTRIN XL) 150 MG 24 hr tablet Take 150 mg by mouth every evening.    Historical Provider, MD  citalopram (CELEXA) 40 MG tablet Take 40 mg by mouth every evening.    Historical Provider, MD  clonazePAM (KLONOPIN) 1 MG tablet Take 1 mg by mouth at bedtime.    Historical Provider, MD  estradiol (ESTRACE) 1 MG tablet Take 1 mg by mouth daily.    Historical Provider, MD  HYDROmorphone (DILAUDID) 2 MG tablet Take 1 tablet (2 mg total) by mouth every 6 (six) hours as needed for severe pain.  04/11/13   Emilee Hero, MD  Multiple Vitamin (MULTIVITAMIN WITH MINERALS) TABS tablet Take 1 tablet by mouth daily.    Historical Provider, MD  traMADol (ULTRAM) 50 MG tablet Take 2 tablets (100 mg total) by mouth every 6 (six) hours as needed. 09/30/13   Adrian Blackwater Edilson Vital, PA-C   BP 135/84  Pulse 82  Temp(Src) 97.4 F (36.3 C) (Oral)  Resp 16 Physical Exam  Nursing note and vitals reviewed. Constitutional: She is oriented to person, place, and time. Vital signs are normal. She appears well-developed and well-nourished. No distress.  HENT:  Head: Normocephalic and atraumatic.  Right Ear: Tympanic membrane, external ear and ear canal normal.  Left Ear: Tympanic membrane,  external ear and ear canal normal.  Nose: Nose normal. Right sinus exhibits no maxillary sinus tenderness and no frontal sinus tenderness. Left sinus exhibits no maxillary sinus tenderness and no frontal sinus tenderness.  Mouth/Throat: Uvula is midline, oropharynx is clear and moist and mucous membranes are normal.  No TMJ pain  Pulmonary/Chest: Effort normal. No respiratory distress.  Musculoskeletal:       Left ankle: Normal.       Left foot: She exhibits tenderness and swelling. She exhibits normal capillary refill and no deformity.       Feet:  Neurological: She is alert and oriented to person, place, and time. She has normal strength. Coordination normal.  Skin: Skin is warm and dry. No rash noted. She is not diaphoretic.  Psychiatric: She has a normal mood and affect. Judgment normal.    ED Course  Procedures (including critical care time) Labs Review Labs Reviewed - No data to display  Imaging Review Dg Foot Complete Left  09/30/2013   CLINICAL DATA:  Foot pain at the distal fifth metatarsal phalangeal joint, trauma 1 week ago with continued pain  EXAM: LEFT FOOT - COMPLETE 3+ VIEW  COMPARISON:  None.  FINDINGS: There is a nondisplaced fracture at the base of the left fifth proximal phalanx. No appreciable angulation. No radiopaque foreign body. No soft tissue abnormality. Minimal plantar calcaneal spurring noted.  IMPRESSION: Oblique fracture at the base of the proximal phalanx of the left fifth toe.   Electronically Signed   By: Christiana Pellant M.D.   On: 09/30/2013 19:01     MDM   1. Toe fracture, left, closed, initial encounter    Fracture is nondisplaced, simple toe fracture, should heal with conservative management. Buddy taped and put in a postop shoe. Ice, elevation, ambulate as tolerated. She will followup with her primary care doctor in one week for a recheck. She will followup with her orthopedic doctor as needed after that if it is not healing appropriately.   Meds  ordered this encounter  Medications  . traMADol (ULTRAM) 50 MG tablet    Sig: Take 2 tablets (100 mg total) by mouth every 6 (six) hours as needed.    Dispense:  30 tablet    Refill:  0    Order Specific Question:  Supervising Provider    Answer:  Lorenz Coaster, DAVID C [[id number]]       Graylon Good, PA-C 09/30/13 1914

## 2013-09-30 NOTE — Discharge Instructions (Signed)
Buddy Taping of Toes °We have taped your toes together to keep them from moving. This is called "buddy taping" since we used a part of your own body to keep the injured part still. We placed soft padding between your toes to keep them from rubbing against each other. Buddy taping will help with healing and to reduce pain. Keep your toes buddy taped together for as long as directed by your caregiver. °HOME CARE INSTRUCTIONS  °· Raise your injured area above the level of your heart while sitting or lying down. Prop it up with pillows. °· An ice pack used every twenty minutes, while awake, for the first one to two days may be helpful. Put ice in a plastic bag and put a towel between the bag and your skin. °· Watch for signs that the taping is too tight. These signs may be: °¨ Numbness of your taped toes. °¨ Coolness of your taped toes. °¨ Color change in the area beyond the tape. °¨ Increased pain. °· If you have any of these signs, loosen or rewrap the tape. If you need to loosen or rewrap the buddy tape, make sure you use the padding again. °SEEK IMMEDIATE MEDICAL CARE IF:  °· You have worse pain, swelling, inflammation (soreness), drainage or bleeding after you rewrap the tape. °· Any new problems occur. °MAKE SURE YOU:  °· Understand these instructions. °· Will watch your condition. °· Will get help right away if you are not doing well or get worse. °Document Released: 10/15/2003 Document Revised: 04/04/2011 Document Reviewed: 01/08/2008 °ExitCare® Patient Information ©2015 [employer]. This information is not intended to replace advice given to you by your health care provider. Make sure you discuss any questions you have with your health care provider. ° °

## 2014-01-30 ENCOUNTER — Emergency Department (HOSPITAL_COMMUNITY): Payer: BLUE CROSS/BLUE SHIELD

## 2014-01-30 ENCOUNTER — Emergency Department (HOSPITAL_COMMUNITY)
Admission: EM | Admit: 2014-01-30 | Discharge: 2014-01-30 | Disposition: A | Discharge status: Home / Self Care | Payer: BLUE CROSS/BLUE SHIELD | Attending: Emergency Medicine | Admitting: Emergency Medicine

## 2014-01-30 ENCOUNTER — Encounter (HOSPITAL_COMMUNITY): Payer: Self-pay | Admitting: Emergency Medicine

## 2014-01-30 DIAGNOSIS — Z79899 Other long term (current) drug therapy: Secondary | ICD-10-CM | POA: Insufficient documentation

## 2014-01-30 DIAGNOSIS — F329 Major depressive disorder, single episode, unspecified: Secondary | ICD-10-CM | POA: Diagnosis not present

## 2014-01-30 DIAGNOSIS — M549 Dorsalgia, unspecified: Secondary | ICD-10-CM

## 2014-01-30 DIAGNOSIS — M546 Pain in thoracic spine: Secondary | ICD-10-CM | POA: Insufficient documentation

## 2014-01-30 DIAGNOSIS — Z720 Tobacco use: Secondary | ICD-10-CM | POA: Insufficient documentation

## 2014-01-30 DIAGNOSIS — R079 Chest pain, unspecified: Secondary | ICD-10-CM

## 2014-01-30 DIAGNOSIS — R0602 Shortness of breath: Secondary | ICD-10-CM

## 2014-01-30 DIAGNOSIS — F419 Anxiety disorder, unspecified: Secondary | ICD-10-CM | POA: Diagnosis not present

## 2014-01-30 DIAGNOSIS — R0789 Other chest pain: Secondary | ICD-10-CM | POA: Diagnosis not present

## 2014-01-30 LAB — URINALYSIS, ROUTINE W REFLEX MICROSCOPIC
BILIRUBIN URINE: NEGATIVE
Glucose, UA: NEGATIVE mg/dL
HGB URINE DIPSTICK: NEGATIVE
Ketones, ur: NEGATIVE mg/dL
Leukocytes, UA: NEGATIVE
Nitrite: NEGATIVE
Protein, ur: NEGATIVE mg/dL
SPECIFIC GRAVITY, URINE: 1.022 (ref 1.005–1.030)
UROBILINOGEN UA: 1 mg/dL (ref 0.0–1.0)
pH: 7.5 (ref 5.0–8.0)

## 2014-01-30 LAB — I-STAT TROPONIN, ED
TROPONIN I, POC: 0 ng/mL (ref 0.00–0.08)
Troponin i, poc: 0 ng/mL (ref 0.00–0.08)

## 2014-01-30 LAB — COMPREHENSIVE METABOLIC PANEL
ALBUMIN: 4.9 g/dL (ref 3.5–5.2)
ALK PHOS: 65 U/L (ref 39–117)
ALT: 29 U/L (ref 0–35)
ANION GAP: 4 — AB (ref 5–15)
AST: 25 U/L (ref 0–37)
BUN: 15 mg/dL (ref 6–23)
CO2: 24 mmol/L (ref 19–32)
Calcium: 9.2 mg/dL (ref 8.4–10.5)
Chloride: 108 mEq/L (ref 96–112)
Creatinine, Ser: 0.75 mg/dL (ref 0.50–1.10)
GFR calc Af Amer: >90 mL/min (ref >90)
GFR calc non Af Amer: >90 mL/min (ref >90)
Glucose, Bld: 93 mg/dL (ref 70–99)
POTASSIUM: 4.1 mmol/L (ref 3.5–5.1)
Sodium: 136 mmol/L (ref 135–145)
TOTAL PROTEIN: 8.2 g/dL (ref 6.0–8.3)
Total Bilirubin: 0.7 mg/dL (ref 0.3–1.2)

## 2014-01-30 LAB — CBC
HEMATOCRIT: 42.2 % (ref 36.0–46.0)
Hemoglobin: 13.5 g/dL (ref 12.0–15.0)
MCH: 30.6 pg (ref 26.0–34.0)
MCHC: 32 g/dL (ref 30.0–36.0)
MCV: 95.7 fL (ref 78.0–100.0)
Platelets: 294 10*3/uL (ref 150–400)
RBC: 4.41 MIL/uL (ref 3.87–5.11)
RDW: 13.4 % (ref 11.5–15.5)
WBC: 11.2 10*3/uL — ABNORMAL HIGH (ref 4.0–10.5)

## 2014-01-30 LAB — BRAIN NATRIURETIC PEPTIDE: B Natriuretic Peptide: 21.3 pg/mL (ref 0.0–100.0)

## 2014-01-30 MED ORDER — KETOROLAC TROMETHAMINE 30 MG/ML IJ SOLN
30.0000 mg | Freq: Once | INTRAMUSCULAR | Status: AC
  Administered 2014-01-30: 30 mg via INTRAMUSCULAR
  Filled 2014-01-30: qty 1

## 2014-01-30 MED ORDER — MORPHINE SULFATE 4 MG/ML IJ SOLN
4.0000 mg | Freq: Once | INTRAMUSCULAR | Status: AC
  Administered 2014-01-30: 4 mg via INTRAMUSCULAR
  Filled 2014-01-30: qty 1

## 2014-01-30 MED ORDER — MORPHINE SULFATE 4 MG/ML IJ SOLN: 4.0000 mg | Freq: Once | INTRAMUSCULAR | Status: DC

## 2014-01-30 MED ORDER — CYCLOBENZAPRINE HCL 10 MG PO TABS
10.0000 mg | ORAL_TABLET | Freq: Once | ORAL | Status: AC
  Administered 2014-01-30: 10 mg via ORAL
  Filled 2014-01-30: qty 1

## 2014-01-30 MED ORDER — OXYCODONE-ACETAMINOPHEN 5-325 MG PO TABS
1.0000 | ORAL_TABLET | Freq: Once | ORAL | Status: AC
  Administered 2014-01-30: 1 via ORAL
  Filled 2014-01-30: qty 1

## 2014-01-30 MED ORDER — CYCLOBENZAPRINE HCL 10 MG PO TABS: 10.0000 mg | ORAL_TABLET | Freq: Every day | ORAL | Status: DC

## 2014-01-30 NOTE — Discharge Instructions (Signed)
Call an orthopedic specailsit for further evaluation of your back pain and likely muscle spasms. Call for a follow up appointment with a Family or Primary Care Provider.  Return if Symptoms worsen.   Take medication as prescribed.  Alternate ice and heat.   Ibuprofen 800mg  3 times a day.

## 2014-01-30 NOTE — ED Provider Notes (Signed)
CSN: [id number]     Arrival date & time 01/30/14  1500 History   First MD Initiated Contact with Patient 01/30/14 1936     Chief Complaint  Patient presents with  . Back Pain  . Chest Pain  . Shortness of Breath     (Consider location/radiation/quality/duration/timing/severity/associated sxs/prior Treatment) HPI Comments: The patient is a 54 year old female with past history of anxiety and depression presenting the emergency room chief complaint of left upper back pain for several days, worsening. Patient reports initial back pain as spasms intermittently. She reports constant pain today with sharp pain with radiation into the chest and around into the right nipple. She also states pressure into chest coming from back. The patient denies shortness of breath, reports pain with deep inspiration. Discomfort is nonexertional. No recent travel, family history or personal history of DVT/PE, lower extremity swelling, smoking, cancer, or exogenous estrogen. Patient reports prior to onset of symptoms she did a lot of activity with lifting and moving of her upper extremities.   The history is provided by the patient. No language interpreter was used.    Past Medical History  Diagnosis Date  . Anxiety   . Depression   . PONV (postoperative nausea and vomiting)    Past Surgical History  Procedure Laterality Date  . Appendectomy    . Abdominal hysterectomy    . Wrist ganglion excision      X2 RT WRIST, X1 LT WRIST  . Incision and drainage hip      RT  . Shoulder arthroscopy      RT  . Bartholin cyst marsupialization N/A 03/07/2013    Procedure: BARTHOLIN CYST MARSUPIALIZATION;  Surgeon: Sharon Seller, DO;  Location: [facility];  Service: Gynecology;  Laterality: N/A;  . Colonoscopy    . Anterior cervical decomp/discectomy fusion N/A 04/10/2013    Procedure: ANTERIOR CERVICAL DECOMPRESSION/DISCECTOMY FUSION 1 LEVEL;  Surgeon: Emilee Hero, MD;  Location: [facility] OR;  Service: Orthopedics;   Laterality: N/A;  Anterior cervical decompression fusion, cervical 6-7 with instrumentation and allograft   Family History  Problem Relation Age of Onset  . Cancer Mother   . Cancer Father    History  Substance Use Topics  . Smoking status: Current Every Day Smoker -- 0.25 packs/day    Types: Cigarettes  . Smokeless tobacco: Never Used  . Alcohol Use: Yes     Comment: SOCIAL   OB History    No data available     Review of Systems  Constitutional: Negative for fever and chills.  Gastrointestinal: Negative for nausea, vomiting and abdominal pain.  Musculoskeletal: Positive for myalgias and back pain.      Allergies  Codeine and Other  Home Medications   Prior to Admission medications   Medication Sig Start Date End Date Taking? Authorizing Provider  buPROPion (WELLBUTRIN XL) 150 MG 24 hr tablet Take 150 mg by mouth every evening.    Historical Provider, MD  citalopram (CELEXA) 40 MG tablet Take 40 mg by mouth every evening.    Historical Provider, MD  clonazePAM (KLONOPIN) 1 MG tablet Take 1 mg by mouth at bedtime.    Historical Provider, MD  estradiol (ESTRACE) 1 MG tablet Take 1 mg by mouth daily.    Historical Provider, MD  HYDROmorphone (DILAUDID) 2 MG tablet Take 1 tablet (2 mg total) by mouth every 6 (six) hours as needed for severe pain. 04/11/13   Emilee Hero, MD  Multiple Vitamin (MULTIVITAMIN WITH MINERALS) TABS tablet Take  1 tablet by mouth daily.    Historical Provider, MD  traMADol (ULTRAM) 50 MG tablet Take 2 tablets (100 mg total) by mouth every 6 (six) hours as needed. 09/30/13   Adrian BlackwaterZachary H Baker, PA-C   BP 152/90 mmHg  Pulse 83  Temp(Src) 97.7 F (36.5 C) (Oral)  Resp 17  SpO2 97% Physical Exam  Constitutional: She appears well-developed and well-nourished. No distress.  HENT:  Head: Normocephalic and atraumatic.  Neck: Neck supple.  Cardiovascular: Normal rate.   Pulmonary/Chest: Effort normal and breath sounds normal.  Abdominal: Soft.  Bowel sounds are normal. There is no tenderness. There is no rebound and no guarding.  Musculoskeletal:       Back:  Skin: Skin is warm and dry. No rash noted.  Psychiatric: She has a normal mood and affect. Her behavior is normal.  Nursing note and vitals reviewed.   ED Course  Procedures (including critical care time) Labs Review Labs Reviewed  CBC - Abnormal; Notable for the following:    WBC 11.2 (*)    All other components within normal limits  COMPREHENSIVE METABOLIC PANEL - Abnormal; Notable for the following:    Anion gap 4 (*)    All other components within normal limits  URINALYSIS, ROUTINE W REFLEX MICROSCOPIC - Abnormal; Notable for the following:    APPearance CLOUDY (*)    All other components within normal limits  BRAIN NATRIURETIC PEPTIDE  I-STAT TROPOININ, ED  I-STAT TROPOININ, ED    Imaging Review Dg Chest 2 View  01/30/2014   CLINICAL DATA:  RIGHT-sided back pain radiating around the chest. Shortness of breath since Tuesday.  EXAM: CHEST  2 VIEW  COMPARISON:  05/06/2012.  FINDINGS: Cardiopericardial silhouette within normal limits. Mediastinal contours normal. Trachea midline. No airspace disease or effusion.  Lower cervical ACDF.  Resection of the distal RIGHT clavicle.  IMPRESSION: No active cardiopulmonary disease.   Electronically Signed   By: Andreas NewportGeoffrey  Lamke M.D.   On: 01/30/2014 18:58     EKG Interpretation   Date/Time:  Thursday January 30 2014 16:07:23 EST Ventricular Rate:  78 PR Interval:  154 QRS Duration: 78 QT Interval:  381 QTC Calculation: 434 R Axis:   54 Text Interpretation:  Sinus rhythm Probable left atrial enlargement Low  voltage, precordial leads Baseline wander in lead(s) I III aVL V2 No  significant change since last tracing Confirmed by GOLDSTON  MD, SCOTT  ([id number]) on 01/30/2014 7:38:59 PM      MDM   Final diagnoses:  Upper back pain on right side  Chest discomfort   Patient presents with right shoulder pain and chest  discomfort likely musculoskeletal. Pain reproducible with palpation. EKG without change from previous. Chest x-ray negative, no LFT elevation no right upper quadrant tenderness with palpation negative Murphy's sign. Plan to rule out ACS giving pressure type sensation and shortness of breath. Patient without signs or risk factors for PE, able to ambulate with a pulse ox are 94% on room air. Negative troponin 2. Re-evaluation patient reports moderate resolution of symptoms requesting be discharged home has home Dilaudid and Motrin request a muscle relaxer for likely muscle pain. Meds given in ED:  Medications  oxyCODONE-acetaminophen (PERCOCET/ROXICET) 5-325 MG per tablet 1 tablet (1 tablet Oral Given 01/30/14 1613)  ketorolac (TORADOL) 30 MG/ML injection 30 mg (30 mg Intramuscular Given 01/30/14 2018)  morphine 4 MG/ML injection 4 mg (4 mg Intramuscular Given 01/30/14 2016)  cyclobenzaprine (FLEXERIL) tablet 10 mg (10 mg Oral Given 01/30/14 2105)  Discharge Medication List as of 01/30/2014  9:36 PM    START taking these medications   Details  cyclobenzaprine (FLEXERIL) 10 MG tablet Take 1 tablet (10 mg total) by mouth at bedtime., Starting 01/30/2014, Until Discontinued, Print        Mellody Drown, PA-C 01/31/14 0001  Audree Camel, MD 02/02/14 [phone]

## 2014-01-30 NOTE — ED Notes (Signed)
Patient woke up with right shoulder blade pain starting Thursday (has to lift son-thought she lifted too much weight). Since then it has progressively worsened. Now feel pressure on right chest and radiates down her arm. Chest pain started around 1100 today. Also feels "severe right breast pain and I feel like my nipples are going to fly off." Having sharp right upper back pain radiating to right shoulder and right arm. Ambulatory with steady gait. Took naproxen with no alleviation of pain. Took Naproxen around 1130. Neurologically intact. Moving all extremities equally. RR even/unlabored.

## 2014-05-15 ENCOUNTER — Other Ambulatory Visit: Payer: Self-pay | Admitting: Nurse Practitioner

## 2014-05-15 ENCOUNTER — Ambulatory Visit
Admission: RE | Admit: 2014-05-15 | Discharge: 2014-05-15 | Disposition: A | Discharge status: Home / Self Care | Payer: BLUE CROSS/BLUE SHIELD | Source: Ambulatory Visit | Attending: Nurse Practitioner | Admitting: Nurse Practitioner

## 2014-05-15 DIAGNOSIS — J40 Bronchitis, not specified as acute or chronic: Secondary | ICD-10-CM

## 2014-07-14 ENCOUNTER — Other Ambulatory Visit: Payer: Self-pay | Admitting: Orthopedic Surgery

## 2014-07-23 ENCOUNTER — Encounter (HOSPITAL_BASED_OUTPATIENT_CLINIC_OR_DEPARTMENT_OTHER): Payer: Self-pay | Admitting: *Deleted

## 2014-07-30 ENCOUNTER — Ambulatory Visit (HOSPITAL_BASED_OUTPATIENT_CLINIC_OR_DEPARTMENT_OTHER)
Admission: RE | Admit: 2014-07-30 | Payer: BLUE CROSS/BLUE SHIELD | Source: Ambulatory Visit | Admitting: Orthopedic Surgery

## 2014-07-30 HISTORY — DX: Snoring: R06.83

## 2014-07-30 HISTORY — DX: Ganglion, unspecified site: M67.40

## 2014-07-30 SURGERY — EXCISION, GANGLION CYST, WRIST
Anesthesia: Choice | Laterality: Right

## 2014-10-10 ENCOUNTER — Other Ambulatory Visit: Payer: Self-pay

## 2014-10-10 DIAGNOSIS — Z1231 Encounter for screening mammogram for malignant neoplasm of breast: Secondary | ICD-10-CM

## 2014-11-21 ENCOUNTER — Ambulatory Visit
Admission: RE | Admit: 2014-11-21 | Discharge: 2014-11-21 | Disposition: A | Discharge status: Home / Self Care | Payer: BLUE CROSS/BLUE SHIELD | Source: Ambulatory Visit

## 2014-11-21 DIAGNOSIS — Z1231 Encounter for screening mammogram for malignant neoplasm of breast: Secondary | ICD-10-CM

## 2015-10-21 ENCOUNTER — Ambulatory Visit
Admission: RE | Admit: 2015-10-21 | Discharge: 2015-10-21 | Disposition: A | Discharge status: Home / Self Care | Payer: 59 | Source: Ambulatory Visit | Attending: Nurse Practitioner | Admitting: Nurse Practitioner

## 2015-10-21 ENCOUNTER — Other Ambulatory Visit: Payer: Self-pay | Admitting: Nurse Practitioner

## 2015-10-21 DIAGNOSIS — R109 Unspecified abdominal pain: Secondary | ICD-10-CM

## 2015-10-21 MED ORDER — IOPAMIDOL (ISOVUE-300) INJECTION 61%
125.0000 mL | Freq: Once | INTRAVENOUS | Status: AC | PRN
  Administered 2015-10-21: 125 mL via INTRAVENOUS

## 2016-03-10 ENCOUNTER — Encounter (HOSPITAL_COMMUNITY): Payer: Self-pay | Admitting: Emergency Medicine

## 2016-03-10 ENCOUNTER — Emergency Department (HOSPITAL_COMMUNITY)
Admission: EM | Admit: 2016-03-10 | Discharge: 2016-03-10 | Disposition: A | Discharge status: Home / Self Care | Payer: 59 | Attending: Emergency Medicine | Admitting: Emergency Medicine

## 2016-03-10 ENCOUNTER — Emergency Department (HOSPITAL_COMMUNITY): Payer: 59

## 2016-03-10 DIAGNOSIS — W540XXA Bitten by dog, initial encounter: Secondary | ICD-10-CM | POA: Insufficient documentation

## 2016-03-10 DIAGNOSIS — Z23 Encounter for immunization: Secondary | ICD-10-CM | POA: Diagnosis not present

## 2016-03-10 DIAGNOSIS — Z79899 Other long term (current) drug therapy: Secondary | ICD-10-CM | POA: Insufficient documentation

## 2016-03-10 DIAGNOSIS — F17210 Nicotine dependence, cigarettes, uncomplicated: Secondary | ICD-10-CM | POA: Diagnosis not present

## 2016-03-10 DIAGNOSIS — S81851A Open bite, right lower leg, initial encounter: Secondary | ICD-10-CM | POA: Diagnosis not present

## 2016-03-10 DIAGNOSIS — Y999 Unspecified external cause status: Secondary | ICD-10-CM | POA: Insufficient documentation

## 2016-03-10 DIAGNOSIS — Y929 Unspecified place or not applicable: Secondary | ICD-10-CM | POA: Insufficient documentation

## 2016-03-10 DIAGNOSIS — Y939 Activity, unspecified: Secondary | ICD-10-CM | POA: Diagnosis not present

## 2016-03-10 MED ORDER — ONDANSETRON 8 MG PO TBDP: 8.0000 mg | ORAL_TABLET | Freq: Three times a day (TID) | ORAL | 0 refills | Status: DC | PRN

## 2016-03-10 MED ORDER — DIPHENHYDRAMINE HCL 50 MG/ML IJ SOLN
12.5000 mg | Freq: Once | INTRAMUSCULAR | Status: AC
  Administered 2016-03-10: 12.5 mg via INTRAVENOUS
  Filled 2016-03-10: qty 1

## 2016-03-10 MED ORDER — IBUPROFEN 600 MG PO TABS: 600.0000 mg | ORAL_TABLET | Freq: Four times a day (QID) | ORAL | 0 refills | Status: DC | PRN

## 2016-03-10 MED ORDER — LIDOCAINE-EPINEPHRINE (PF) 2 %-1:200000 IJ SOLN
10.0000 mL | Freq: Once | INTRAMUSCULAR | Status: AC
  Administered 2016-03-10: 10 mL via INTRADERMAL
  Filled 2016-03-10: qty 20

## 2016-03-10 MED ORDER — PIPERACILLIN-TAZOBACTAM 3.375 G IVPB
3.3750 g | Freq: Once | INTRAVENOUS | Status: AC
  Administered 2016-03-10: 3.375 g via INTRAVENOUS
  Filled 2016-03-10: qty 50

## 2016-03-10 MED ORDER — TETANUS-DIPHTH-ACELL PERTUSSIS 5-2.5-18.5 LF-MCG/0.5 IM SUSP
0.5000 mL | Freq: Once | INTRAMUSCULAR | Status: AC
  Administered 2016-03-10: 0.5 mL via INTRAMUSCULAR
  Filled 2016-03-10: qty 0.5

## 2016-03-10 MED ORDER — ONDANSETRON HCL 4 MG/2ML IJ SOLN
4.0000 mg | Freq: Once | INTRAMUSCULAR | Status: AC
  Administered 2016-03-10: 4 mg via INTRAVENOUS
  Filled 2016-03-10: qty 2

## 2016-03-10 MED ORDER — AMOXICILLIN-POT CLAVULANATE 875-125 MG PO TABS: 1.0000 | ORAL_TABLET | Freq: Two times a day (BID) | ORAL | 0 refills | Status: DC

## 2016-03-10 MED ORDER — BACITRACIN ZINC 500 UNIT/GM EX OINT
TOPICAL_OINTMENT | Freq: Two times a day (BID) | CUTANEOUS | Status: DC
  Administered 2016-03-10: 2 via TOPICAL
  Filled 2016-03-10: qty 5.4

## 2016-03-10 MED ORDER — HYDROMORPHONE HCL 1 MG/ML IJ SOLN
1.0000 mg | Freq: Once | INTRAMUSCULAR | Status: AC
  Administered 2016-03-10: 1 mg via INTRAVENOUS
  Filled 2016-03-10: qty 1

## 2016-03-10 MED ORDER — OXYCODONE-ACETAMINOPHEN 5-325 MG PO TABS: 1.0000 | ORAL_TABLET | ORAL | 0 refills | Status: DC | PRN

## 2016-03-10 NOTE — ED Triage Notes (Signed)
Pt with dog bite to RLE, pt with wound to anterior R lower leg and R lateral lower leg. Pt states bite is from a single dog as she was attempting to break up a dog fight.

## 2016-03-10 NOTE — ED Provider Notes (Signed)
[facility]-EMERGENCY DEPT Provider Note   CSN: [id number] Arrival date & time: 03/10/16  0706     History   Chief Complaint Chief Complaint  Patient presents with  . Animal Bite    HPI Norma [name] is a 56 y.o. female.  HPI Norma [name] is a 56 y.o. female presents to emergency department complaining of a dog bite. Patient states she was breaking up her dog fight, when her dog bit her on the right lower leg. She reports several deep bite wounds to the right lower leg with persistent bleeding. She denies any numbness or weakness distal to the wounds, however different to assess due to pain. She states any lower leg movement or palpation of the area making her pain worse. She is also complaining of falling to the ground and superficial abrasions to the left leg. She states that her dogs vaccines are up to date. She states her tetanus is up-to-date. Dressing was applied to the leg prior to coming to the emergency department.   Past Medical History:  Diagnosis Date  . Anxiety   . Depression   . PONV (postoperative nausea and vomiting)   . Snores   . Volar retinacular ganglion     Patient Active Problem List   Diagnosis Date Noted  . Radiculopathy 04/10/2013    Past Surgical History:  Procedure Laterality Date  . ABDOMINAL HYSTERECTOMY    . ANTERIOR CERVICAL DECOMP/DISCECTOMY FUSION N/A 04/10/2013   Procedure: ANTERIOR CERVICAL DECOMPRESSION/DISCECTOMY FUSION 1 LEVEL;  Surgeon: Emilee Hero, MD;  Location: [facility] OR;  Service: Orthopedics;  Laterality: N/A;  Anterior cervical decompression fusion, cervical 6-7 with instrumentation and allograft  . APPENDECTOMY    . BARTHOLIN CYST MARSUPIALIZATION N/A 03/07/2013   Procedure: BARTHOLIN CYST MARSUPIALIZATION;  Surgeon: Sharon Seller, DO;  Location: [facility] ORS;  Service: Gynecology;  Laterality: N/A;  . COLONOSCOPY    . INCISION AND DRAINAGE HIP     RT  . SHOULDER ARTHROSCOPY     RT  . WRIST GANGLION EXCISION     X2 RT WRIST, X1 LT  WRIST    OB History    No data available       Home Medications    Prior to Admission medications   Medication Sig Start Date End Date Taking? Authorizing Provider  buPROPion (WELLBUTRIN XL) 150 MG 24 hr tablet Take 150 mg by mouth every evening.    Historical Provider, MD  citalopram (CELEXA) 40 MG tablet Take 40 mg by mouth every evening.    Historical Provider, MD  clonazePAM (KLONOPIN) 1 MG tablet Take 1 mg by mouth at bedtime.    Historical Provider, MD  cyclobenzaprine (FLEXERIL) 10 MG tablet Take 1 tablet (10 mg total) by mouth at bedtime. 01/30/14   Mellody Drown, PA-C  estradiol (ESTRACE) 1 MG tablet Take 1 mg by mouth daily.    Historical Provider, MD  Fenofibrate (TRICOR PO) Take 1 tablet by mouth daily.    Historical Provider, MD  Multiple Vitamin (MULTIVITAMIN WITH MINERALS) TABS tablet Take 1 tablet by mouth daily.    Historical Provider, MD  traMADol (ULTRAM) 50 MG tablet Take 2 tablets (100 mg total) by mouth every 6 (six) hours as needed. 09/30/13   Graylon Good, PA-C    Family History Family History  Problem Relation Age of Onset  . Cancer Mother   . Cancer Father     Social History Social History  Substance Use Topics  . Smoking status: Current Every  Day Smoker    Packs/day: 0.25    Types: Cigarettes  . Smokeless tobacco: Never Used  . Alcohol use Yes     Comment: SOCIAL     Allergies   Codeine and Other   Review of Systems Review of Systems  Constitutional: Negative for chills and fever.  Respiratory: Negative for cough, chest tightness and shortness of breath.   Cardiovascular: Negative for chest pain, palpitations and leg swelling.  Gastrointestinal: Negative for abdominal pain, diarrhea, nausea and vomiting.  Musculoskeletal: Positive for myalgias. Negative for neck pain and neck stiffness.  Skin: Positive for wound. Negative for rash.  Neurological: Negative for dizziness, weakness and headaches.  All other systems reviewed and are  negative.    Physical Exam Updated Vital Signs BP 157/79 (BP Location: Left Arm)   Pulse 92   Temp 97.3 F (36.3 C) (Oral)   Resp 22   SpO2 99%   Physical Exam  Constitutional: She appears well-developed and well-nourished. No distress.  HENT:  Head: Normocephalic.  Eyes: Conjunctivae are normal.  Neck: Neck supple.  Cardiovascular: Normal rate, regular rhythm and normal heart sounds.   Pulmonary/Chest: Effort normal and breath sounds normal. No respiratory distress. She has no wheezes. She has no rales.  Musculoskeletal: She exhibits no edema.       Legs: 6 cm parallel gaping lacerations to the right lower lateral calf, extending through the subcutaneous tissue, into the muscle. Flaps puncture laceration, 4 cm, to the anterior distal right leg. There is a smaller, 1 cm puncture laceration that connects under the skin just proximal to the flap. Hemostatic. Normal ankle and foot. Full range of motion of all toes and ankle. Dorsal pedal pulses intact. Sensation is intact distally. Capillary refill less than 2 seconds distally.  Neurological: She is alert.  Skin: Skin is warm and dry.  Psychiatric: She has a normal mood and affect. Her behavior is normal.  Nursing note and vitals reviewed.    ED Treatments / Results  Labs (all labs ordered are listed, but only abnormal results are displayed) Labs Reviewed - No data to display  EKG  EKG Interpretation None       Radiology Dg Tibia/fibula Right  Result Date: 03/10/2016 CLINICAL DATA:  Dog bite RIGHT lower extremity this morning, puncture wounds at anterolateral RIGHT lower leg EXAM: RIGHT TIBIA AND FIBULA - 2 VIEW COMPARISON:  None FINDINGS: Soft tissue defects with scattered soft tissue gas at the lateral aspect of the mid RIGHT calf. Osseous mineralization normal. Hip joint spaces preserved. No acute fracture or dislocation. Small cortical lucency with a well defined sclerotic margin at the distal RIGHT femoral metaphysis  posteriorly, benign in appearance, question sequela of a fibrous cortical defect. No definite radiopaque foreign bodies identified. IMPRESSION: No acute osseous abnormalities at RIGHT lower leg. Soft tissue swelling and gas at the level of the mid calf laterally secondary to dog bite. Electronically Signed   By: Ulyses Southward M.D.   On: 03/10/2016 08:26    Procedures Procedures (including critical care time) LACERATION REPAIR Performed by: Jaynie Crumble A Authorized by: Jaynie Crumble A Consent: Verbal consent obtained. Risks and benefits: risks, benefits and alternatives were discussed Consent given by: patient Patient identity confirmed: provided demographic data Prepped and Draped in normal sterile fashion Wound explored  Laceration Location: right lower leg  Laceration Length: 6cm  No Foreign Bodies seen or palpated  Anesthesia: local infiltration  Local anesthetic: lidocaine 2% w epinephrine  Anesthetic total: 4 ml  Irrigation method: syringe Amount of cleaning: extensive saline irrigation, betadine scrub  Skin closure: prolene 3.0  Number of sutures: 3  Technique: simple interrupted  Patient tolerance: Patient tolerated the procedure well with no immediate complications.   LACERATION REPAIR Performed by: Lottie MusselKIRICHENKO, Rykin Route A Authorized by: Jaynie CrumbleKIRICHENKO, Kayliee Atienza A Consent: Verbal consent obtained. Risks and benefits: risks, benefits and alternatives were discussed Consent given by: patient Patient identity confirmed: provided demographic data Prepped and Draped in normal sterile fashion Wound explored  Laceration Location: right lower leg, distal  Laceration Length: 6cm  No Foreign Bodies seen or palpated  Anesthesia: local infiltration  Local anesthetic: lidocaine 2% w epinephrine  Anesthetic total: 4 ml  Irrigation method: syringe Amount of cleaning: extensive saline irrigation, betadine scrub  Skin closure: prolene 3.0  Number of  sutures: 3  Technique: simple interrupted  Patient tolerance: Patient tolerated the procedure well with no immediate complications.  LACERATION REPAIR Performed by: Lottie MusselKIRICHENKO, Lillard Bailon A Authorized by: Jaynie CrumbleKIRICHENKO, Fayette Gasner A Consent: Verbal consent obtained. Risks and benefits: risks, benefits and alternatives were discussed Consent given by: patient Patient identity confirmed: provided demographic data Prepped and Draped in normal sterile fashion Wound explored  Laceration Location: right lower leg, anterior Laceration Length: 4cm flap  No Foreign Bodies seen or palpated  Anesthesia: local infiltration  Local anesthetic: lidocaine 2% w epinephrine  Anesthetic total: 4 ml  Irrigation method: syringe Amount of cleaning: extensive saline irrigation, betadine scrub  Skin closure: prolene 3.0  Number of sutures: 2  Technique: simple interrupted  Patient tolerance: Patient tolerated the procedure well with no immediate complications.   Medications Ordered in ED Medications  HYDROmorphone (DILAUDID) injection 1 mg (1 mg Intravenous Given 03/10/16 0759)  ondansetron (ZOFRAN) injection 4 mg (4 mg Intravenous Given 03/10/16 0758)  lidocaine-EPINEPHrine (XYLOCAINE W/EPI) 2 %-1:200000 (PF) injection 10 mL (10 mLs Intradermal Given 03/10/16 0759)     Initial Impression / Assessment and Plan / ED Course  I have reviewed the triage vital signs and the nursing notes.  Pertinent labs & imaging results that were available during my care of the patient were reviewed by me and considered in my medical decision making (see chart for details).    Pt with deep dog bite wound to the right lower leg. Xray oredered. Will update tetanus. Dilaudid, zofran for pain.   9:52 AM disucussed with Dr. Luiz BlareGraves with orthopedics, advised loosely closing the wound, home with antibiotics, he will follow up. He was able to see the pictures of the wound.   Gaping lacerations repaired with loose sutures.  All wounds were extensively irrigated with 1 L of saline, safe cleanse, rated and scrub. She was given Zosyn for antibiotics and emergency department. We'll discharge home with Percocet, Augmentin, follow-up with Dr. Luiz BlareGraves.  Vitals:   03/10/16 0719 03/10/16 0929 03/10/16 1000  BP: 157/79 130/59   Pulse: 92 74   Resp: 22 18   Temp: 97.3 F (36.3 C) 97.6 F (36.4 C)   TempSrc: Oral Oral   SpO2: 99% 99%   Weight:   86.2 kg     Final Clinical Impressions(s) / ED Diagnoses   Final diagnoses:  Dog bite, initial encounter    New Prescriptions Discharge Medication List as of 03/10/2016 10:15 AM    START taking these medications   Details  amoxicillin-clavulanate (AUGMENTIN) 875-125 MG tablet Take 1 tablet by mouth every 12 (twelve) hours., Starting Thu 03/10/2016, Print    ibuprofen (ADVIL,MOTRIN) 600 MG tablet Take 1 tablet (600 mg total)  by mouth every 6 (six) hours as needed., Starting Thu 03/10/2016, Print    ondansetron (ZOFRAN ODT) 8 MG disintegrating tablet Take 1 tablet (8 mg total) by mouth every 8 (eight) hours as needed for nausea or vomiting., Starting Thu 03/10/2016, Print    oxyCODONE-acetaminophen (PERCOCET) 5-325 MG tablet Take 1 tablet by mouth every 4 (four) hours as needed for severe pain., Starting Thu 03/10/2016, Print         Jaynie Crumble, PA-C 03/10/16 1518    Jacalyn Lefevre, MD 03/17/16 1332

## 2016-03-10 NOTE — Discharge Instructions (Signed)
Keep your leg elevated. Ice several times a day. Change dressing to the wounds as needed. Take Augmentin as prescribed, next dose tonight. Take ibuprofen for pain. Percocet for severe pain. Take Zofran as needed for nausea. Follow-up with Dr. Luiz BlareGraves as he instructed. Watch for any signs of infection such as swelling, drainage, fever. Return as needed.

## 2016-04-15 ENCOUNTER — Emergency Department (HOSPITAL_BASED_OUTPATIENT_CLINIC_OR_DEPARTMENT_OTHER)
Admission: EM | Admit: 2016-04-15 | Discharge: 2016-04-15 | Disposition: A | Discharge status: Home / Self Care | Payer: 59 | Attending: Emergency Medicine | Admitting: Emergency Medicine

## 2016-04-15 ENCOUNTER — Emergency Department (HOSPITAL_BASED_OUTPATIENT_CLINIC_OR_DEPARTMENT_OTHER): Payer: 59

## 2016-04-15 ENCOUNTER — Encounter (HOSPITAL_BASED_OUTPATIENT_CLINIC_OR_DEPARTMENT_OTHER): Payer: Self-pay | Admitting: Emergency Medicine

## 2016-04-15 DIAGNOSIS — M5414 Radiculopathy, thoracic region: Secondary | ICD-10-CM | POA: Diagnosis not present

## 2016-04-15 DIAGNOSIS — F17210 Nicotine dependence, cigarettes, uncomplicated: Secondary | ICD-10-CM | POA: Insufficient documentation

## 2016-04-15 DIAGNOSIS — M546 Pain in thoracic spine: Secondary | ICD-10-CM | POA: Diagnosis present

## 2016-04-15 DIAGNOSIS — M549 Dorsalgia, unspecified: Secondary | ICD-10-CM

## 2016-04-15 LAB — CBC WITH DIFFERENTIAL/PLATELET
BASOS PCT: 0 %
Basophils Absolute: 0 10*3/uL (ref 0.0–0.1)
EOS ABS: 0.1 10*3/uL (ref 0.0–0.7)
Eosinophils Relative: 1 %
HEMATOCRIT: 39.4 % (ref 36.0–46.0)
Hemoglobin: 13.3 g/dL (ref 12.0–15.0)
Lymphocytes Relative: 19 %
Lymphs Abs: 2 10*3/uL (ref 0.7–4.0)
MCH: 31.4 pg (ref 26.0–34.0)
MCHC: 33.8 g/dL (ref 30.0–36.0)
MCV: 93.1 fL (ref 78.0–100.0)
MONO ABS: 0.8 10*3/uL (ref 0.1–1.0)
MONOS PCT: 8 %
NEUTROS ABS: 7.7 10*3/uL (ref 1.7–7.7)
Neutrophils Relative %: 72 %
Platelets: 239 10*3/uL (ref 150–400)
RBC: 4.23 MIL/uL (ref 3.87–5.11)
RDW: 13.1 % (ref 11.5–15.5)
WBC: 10.6 10*3/uL — ABNORMAL HIGH (ref 4.0–10.5)

## 2016-04-15 LAB — BASIC METABOLIC PANEL
ANION GAP: 8 (ref 5–15)
BUN: 10 mg/dL (ref 6–20)
CO2: 23 mmol/L (ref 22–32)
Calcium: 8.6 mg/dL — ABNORMAL LOW (ref 8.9–10.3)
Chloride: 103 mmol/L (ref 101–111)
Creatinine, Ser: 0.56 mg/dL (ref 0.44–1.00)
GFR calc Af Amer: >60 mL/min (ref >60)
GFR calc non Af Amer: >60 mL/min (ref >60)
Glucose, Bld: 130 mg/dL — ABNORMAL HIGH (ref 65–99)
POTASSIUM: 4 mmol/L (ref 3.5–5.1)
SODIUM: 134 mmol/L — AB (ref 135–145)

## 2016-04-15 LAB — TROPONIN I: Troponin I: <0.03 ng/mL (ref <0.03)

## 2016-04-15 LAB — D-DIMER, QUANTITATIVE (NOT AT ARMC): D DIMER QUANT: 0.32 ug{FEU}/mL (ref 0.00–0.50)

## 2016-04-15 MED ORDER — HYDROCODONE-ACETAMINOPHEN 5-325 MG PO TABS
1.0000 | ORAL_TABLET | Freq: Once | ORAL | Status: AC
Start: 2016-04-15 — End: 2016-04-15
  Administered 2016-04-15: 1 via ORAL
  Filled 2016-04-15: qty 1

## 2016-04-15 MED ORDER — METHOCARBAMOL 500 MG PO TABS: 500.0000 mg | ORAL_TABLET | Freq: Two times a day (BID) | ORAL | 0 refills | Status: DC

## 2016-04-15 MED ORDER — DIPHENHYDRAMINE HCL 25 MG PO CAPS
50.0000 mg | ORAL_CAPSULE | Freq: Once | ORAL | Status: AC
  Administered 2016-04-15: 50 mg via ORAL
  Filled 2016-04-15: qty 2

## 2016-04-15 MED ORDER — KETOROLAC TROMETHAMINE 30 MG/ML IJ SOLN
30.0000 mg | Freq: Once | INTRAMUSCULAR | Status: AC
Start: 2016-04-15 — End: 2016-04-15
  Administered 2016-04-15: 30 mg via INTRAVENOUS
  Filled 2016-04-15: qty 1

## 2016-04-15 MED ORDER — ONDANSETRON HCL 4 MG/2ML IJ SOLN
4.0000 mg | Freq: Once | INTRAMUSCULAR | Status: AC
  Administered 2016-04-15: 4 mg via INTRAVENOUS
  Filled 2016-04-15: qty 2

## 2016-04-15 MED ORDER — MORPHINE SULFATE (PF) 4 MG/ML IV SOLN
4.0000 mg | Freq: Once | INTRAVENOUS | Status: AC
Start: 2016-04-15 — End: 2016-04-15
  Administered 2016-04-15: 4 mg via INTRAVENOUS
  Filled 2016-04-15: qty 1

## 2016-04-15 MED ORDER — LIDOCAINE 5 % EX PTCH: 1.0000 | MEDICATED_PATCH | CUTANEOUS | 0 refills | Status: DC

## 2016-04-15 MED ORDER — IBUPROFEN 800 MG PO TABS: 800.0000 mg | ORAL_TABLET | Freq: Three times a day (TID) | ORAL | 0 refills | Status: DC

## 2016-04-15 MED FILL — IBUPROFEN 800 MG TABLET: 800 | 7 days supply | Qty: 21 | Fill #0

## 2016-04-15 MED FILL — METHOCARBAMOL 500 MG TABLET: 500 | 10 days supply | Qty: 20 | Fill #0

## 2016-04-15 NOTE — ED Provider Notes (Signed)
Emergency Department Provider Note   I have reviewed the triage vital signs and the nursing notes.   HISTORY  Chief Complaint Back Pain   HPI Norma [name] is a 56 y.o. female with PMH of anxiety, depression, and cervical radiculopathy presents to the emergency department for evaluation of gradually worsening right sided mid back pain that radiates around the right lateral chest wall. Patient describes pain as severe and burning in quality. No injury. No obvious inciting infant. Worse with touching the skin or movement. No alleviating factors after trying over-the-counter medicines. Denies any associated weakness or numbness. She does have some right arm discomfort as well. She is not appreciated any rash. She notes a similar episode several years ago but cannot recall the diagnosis at that time. No fevers or chills. No abdominal discomfort, vomiting, diarrhea.   Past Medical History:  Diagnosis Date  . Anxiety   . Depression   . PONV (postoperative nausea and vomiting)   . Snores   . Volar retinacular ganglion     Patient Active Problem List   Diagnosis Date Noted  . Radiculopathy 04/10/2013    Past Surgical History:  Procedure Laterality Date  . ABDOMINAL HYSTERECTOMY    . ANTERIOR CERVICAL DECOMP/DISCECTOMY FUSION N/A 04/10/2013   Procedure: ANTERIOR CERVICAL DECOMPRESSION/DISCECTOMY FUSION 1 LEVEL;  Surgeon: Emilee Hero, MD;  Location: [facility] OR;  Service: Orthopedics;  Laterality: N/A;  Anterior cervical decompression fusion, cervical 6-7 with instrumentation and allograft  . APPENDECTOMY    . BARTHOLIN CYST MARSUPIALIZATION N/A 03/07/2013   Procedure: BARTHOLIN CYST MARSUPIALIZATION;  Surgeon: Sharon Seller, DO;  Location: [facility] OR[facility];  Service: Gynecology;  Laterality: N/A;  . COLONOSCOPY    . INCISION AND DRAINAGE HIP     RT  . SHOULDER ARTHROSCOPY     RT  . WRIST GANGLION EXCISION     X2 RT WRIST, X1 LT WRIST    Current Outpatient Rx  . Order #:  [id number] Class: Print  . Order #: [id number] Class: Historical Med  . Order #: [id number] Class: Historical Med  . Order #: [id number] Class: Historical Med  . Order #: [id number] Class: Print  . Order #: [id number] Class: Historical Med  . Order #: [id number] Class: Historical Med  . Order #: [id number] Class: Print  . Order #: [id number] Class: Print  . Order #: [id number] Class: Print  . Order #: [id number] Class: Historical Med  . Order #: [id number] Class: Print  . Order #: [id number] Class: Print  . Order #: [id number] Class: Print    Allergies Codeine and Other  Family History  Problem Relation Age of Onset  . Cancer Mother   . Cancer Father     Social History Social History  Substance Use Topics  . Smoking status: Current Every Day Smoker    Packs/day: 0.25    Types: Cigarettes  . Smokeless tobacco: Never Used  . Alcohol use Yes     Comment: SOCIAL    Review of Systems  Constitutional: No fever/chills Eyes: No visual changes. ENT: No sore throat. Cardiovascular: Denies chest pain. Respiratory: Denies shortness of breath. Gastrointestinal: No abdominal pain.  No nausea, no vomiting.  No diarrhea.  No constipation. Genitourinary: Negative for dysuria. Musculoskeletal: Positive for back pain and lateral right chest wall pain.  Skin: Negative for rash. Neurological: Negative for headaches, focal weakness or numbness.  10-point ROS otherwise negative.  ____________________________________________   PHYSICAL EXAM:  VITAL SIGNS: ED Triage Vitals  Enc Vitals Group     BP 04/15/16 0814 (!) 178/88  Pulse Rate 04/15/16 0814 74     Resp 04/15/16 0814 16     Temp 04/15/16 0814 97.6 F (36.4 C)     Temp Source 04/15/16 0814 Oral     SpO2 04/15/16 0814 99 %     Weight 04/15/16 0814 189 lb (85.7 kg)     Height 04/15/16 0814 5\' 5"  (1.651 m)     Pain Score 04/15/16 0815 9    Constitutional: Alert and oriented. Appears uncomfortable.  Eyes: Conjunctivae are normal. Head:  Atraumatic. Nose: No congestion/rhinnorhea. Mouth/Throat: Mucous membranes are moist.  Neck: No stridor.   Cardiovascular: Normal rate, regular rhythm. Good peripheral circulation. Grossly normal heart sounds.   Respiratory: Normal respiratory effort.  No retractions. Lungs CTAB. Gastrointestinal: Soft and nontender. No distention.  Musculoskeletal: No lower extremity tenderness nor edema. No gross deformities of extremities. Tenderness to palpation with even light touch of the skin in a dermatomal distribution. No rash appreciated.  Neurologic:  Normal speech and language. No gross focal neurologic deficits are appreciated.  Skin:  Skin is warm, dry and intact. No rash noted. Psychiatric: Mood and affect are normal. Speech and behavior are normal.  ____________________________________________   LABS (all labs ordered are listed, but only abnormal results are displayed)  Labs Reviewed  BASIC METABOLIC PANEL - Abnormal; Notable for the following:       Result Value   Sodium 134 (*)    Glucose, Bld 130 (*)    Calcium 8.6 (*)    All other components within normal limits  CBC WITH DIFFERENTIAL/PLATELET - Abnormal; Notable for the following:    WBC 10.6 (*)    All other components within normal limits  TROPONIN I  D-DIMER, QUANTITATIVE (NOT AT [facility])   ____________________________________________  EKG   EKG Interpretation  Date/Time:  Friday April 15 2016 08:44:21 EDT Ventricular Rate:  72 PR Interval:    QRS Duration: 87 QT Interval:  426 QTC Calculation: 467 R Axis:   67 Text Interpretation:  Sinus rhythm No STEMI. Similar to prior.  Confirmed by LONG MD, JOSHUA [phone]) on 04/15/2016 8:59:41 AM       ____________________________________________  RADIOLOGY  Dg Chest 2 View  Result Date: 04/15/2016 CLINICAL DATA:  Back and shoulder pain. EXAM: CHEST  2 VIEW COMPARISON:  05/15/2014.  01/30/2014.  04/13/ 2014.  03/22/2012. FINDINGS: Mediastinum and hilar structures are  normal. Heart size is stable. Opacity noted over the right upper lung on PA view only is stable and consistent prominent costochondral calcification and underlying vessels. No pleural effusion pneumothorax. Cervicothoracic spine fusion. IMPRESSION: No acute cardiopulmonary disease. Electronically Signed   By: Maisie Fus  Register   On: 04/15/2016 09:10   Dg Thoracic Spine W/swimmers  Result Date: 04/15/2016 CLINICAL DATA:  Back and shoulder pain. EXAM: THORACIC SPINE - 3 VIEWS COMPARISON:  05/15/2014. FINDINGS: Diffuse degenerative change. Mild S shaped scoliosis. No acute bony abnormality identified. Axis C7 anterior interbody fusion. IMPRESSION: Diffuse degenerative change. Mild a shaped scoliosis. C6-C7 anterior and interbody fusion. No acute abnormality identified. Electronically Signed   By: Maisie Fus  Register   On: 04/15/2016 09:12    ____________________________________________   PROCEDURES  Procedure(s) performed:   Procedures  None ____________________________________________   INITIAL IMPRESSION / ASSESSMENT AND PLAN / ED COURSE  Pertinent labs & imaging results that were available during my care of the patient were reviewed by me and considered in my medical decision making (see chart for details).  Patient resents to the emergency department for evaluation of  a right sided thoracic back pain radiating around to the right lateral chest. Worse with movement. Slightly worse with deep breathing. No neurological deficits. Patient has a history of radiculopathy. Differential includes atypical ACS, PE, thoracic radicular pain, zoster, with vascular etiology less likely. Plan for pain control with oral medications. We'll obtain lab work including troponin. Plan for imaging of the chest and thoracic spine to rule out pneumonia or pathologic fracture of the thoracic spine. Lower suspicion for these diagnoses. Patient is low risk for pulmonary embolism by Wells criteria. Given her age she is  unable to [facility] so we'll also send a d-dimer.   09:30 AM D-dimer, troponin, other labs are normal. No acute findings on imaging of the chest or thoracic spine. Patient complaining of continued severe pain. Plan for morphine to manage acute pain and reassess.  10:10 AM Patient feeling better after pain medication. Will discharge home with plan for continued supportive care. Discussed return precautions in detail. Patient comfortable with plan at discharge.   At this time, I do not feel there is any life-threatening condition present. I have reviewed and discussed all results (EKG, imaging, lab, urine as appropriate), exam findings with patient. I have reviewed nursing notes and appropriate previous records.  I feel the patient is safe to be discharged home without further emergent workup. Discussed usual and customary return precautions. Patient and family (if present) verbalize understanding and are comfortable with this plan.  Patient will follow-up with their primary care provider. If they do not have a primary care provider, information for follow-up has been provided to them. All questions have been answered.  ____________________________________________  FINAL CLINICAL IMPRESSION(S) / ED DIAGNOSES  Final diagnoses:  Thoracic radiculopathy     MEDICATIONS GIVEN DURING THIS VISIT:  Medications  HYDROcodone-acetaminophen (NORCO/VICODIN) 5-325 MG per tablet 1 tablet (1 tablet Oral Given 04/15/16 0845)  morphine 4 MG/ML injection 4 mg (4 mg Intravenous Given 04/15/16 0930)  ondansetron (ZOFRAN) injection 4 mg (4 mg Intravenous Given 04/15/16 0936)  ketorolac (TORADOL) 30 MG/ML injection 30 mg (30 mg Intravenous Given 04/15/16 1009)  diphenhydrAMINE (BENADRYL) capsule 50 mg (50 mg Oral Given 04/15/16 1018)     NEW OUTPATIENT MEDICATIONS STARTED DURING THIS VISIT:  Discharge Medication List as of 04/15/2016 10:43 AM    START taking these medications   Details  lidocaine  (LIDODERM) 5 % Place 1 patch onto the skin daily. Remove & Discard patch within 12 hours or as directed by MD, Starting Fri 04/15/2016, Print    methocarbamol (ROBAXIN) 500 MG tablet Take 1 tablet (500 mg total) by mouth 2 (two) times daily., Starting Fri 04/15/2016, Print        Note:  This document was prepared using Dragon voice recognition software and may include unintentional dictation errors.  Alona BeneJoshua Long, MD Emergency Medicine   Maia PlanJoshua G Long, MD 04/15/16 [phone]

## 2016-04-15 NOTE — ED Triage Notes (Addendum)
Patient reports right upper back pain which radiates into her right breast and right arm and right flank. Reports this began yesterday.  States "I can't touch my breast, I feel like my nipple is going to pop off".  Denies injury.  Reports tenderness on palpation.  States pain is worse in her back than in her breast.

## 2016-04-15 NOTE — Discharge Instructions (Signed)

## 2016-04-15 NOTE — ED Notes (Signed)
ED Provider at bedside. 

## 2016-05-16 ENCOUNTER — Other Ambulatory Visit: Payer: Self-pay | Admitting: Gastroenterology

## 2016-05-27 ENCOUNTER — Other Ambulatory Visit: Payer: Self-pay | Admitting: Gastroenterology

## 2016-06-13 ENCOUNTER — Encounter (HOSPITAL_COMMUNITY): Payer: Self-pay | Admitting: *Deleted

## 2016-06-15 ENCOUNTER — Ambulatory Visit (HOSPITAL_COMMUNITY): Payer: 59 | Admitting: Anesthesiology

## 2016-06-15 ENCOUNTER — Encounter (HOSPITAL_COMMUNITY): Payer: Self-pay | Admitting: *Deleted

## 2016-06-15 ENCOUNTER — Ambulatory Visit (HOSPITAL_COMMUNITY)
Admission: RE | Admit: 2016-06-15 | Discharge: 2016-06-15 | Disposition: A | Discharge status: Home / Self Care | Payer: 59 | Source: Ambulatory Visit | Attending: Gastroenterology | Admitting: Gastroenterology

## 2016-06-15 ENCOUNTER — Ambulatory Visit (HOSPITAL_COMMUNITY): Payer: 59

## 2016-06-15 ENCOUNTER — Encounter (HOSPITAL_COMMUNITY)
Admission: RE | Disposition: A | Discharge status: Home / Self Care | Payer: Self-pay | Source: Ambulatory Visit | Attending: Gastroenterology

## 2016-06-15 DIAGNOSIS — E7800 Pure hypercholesterolemia, unspecified: Secondary | ICD-10-CM | POA: Insufficient documentation

## 2016-06-15 DIAGNOSIS — N3941 Urge incontinence: Secondary | ICD-10-CM | POA: Insufficient documentation

## 2016-06-15 DIAGNOSIS — F17200 Nicotine dependence, unspecified, uncomplicated: Secondary | ICD-10-CM | POA: Diagnosis not present

## 2016-06-15 DIAGNOSIS — Z86010 Personal history of colonic polyps: Secondary | ICD-10-CM | POA: Insufficient documentation

## 2016-06-15 DIAGNOSIS — F419 Anxiety disorder, unspecified: Secondary | ICD-10-CM | POA: Diagnosis not present

## 2016-06-15 DIAGNOSIS — F329 Major depressive disorder, single episode, unspecified: Secondary | ICD-10-CM | POA: Insufficient documentation

## 2016-06-15 DIAGNOSIS — Z888 Allergy status to other drugs, medicaments and biological substances status: Secondary | ICD-10-CM | POA: Diagnosis not present

## 2016-06-15 DIAGNOSIS — J309 Allergic rhinitis, unspecified: Secondary | ICD-10-CM | POA: Insufficient documentation

## 2016-06-15 DIAGNOSIS — Z1211 Encounter for screening for malignant neoplasm of colon: Secondary | ICD-10-CM | POA: Diagnosis not present

## 2016-06-15 DIAGNOSIS — R109 Unspecified abdominal pain: Secondary | ICD-10-CM

## 2016-06-15 HISTORY — PX: COLONOSCOPY WITH PROPOFOL: SHX5780

## 2016-06-15 SURGERY — COLONOSCOPY WITH PROPOFOL
Anesthesia: Monitor Anesthesia Care

## 2016-06-15 MED ORDER — PROPOFOL 10 MG/ML IV BOLUS
INTRAVENOUS | Status: AC
  Filled 2016-06-15: qty 20

## 2016-06-15 MED ORDER — LACTATED RINGERS IV SOLN
INTRAVENOUS | Status: DC
  Administered 2016-06-15: 12:00:00 via INTRAVENOUS

## 2016-06-15 MED ORDER — LIDOCAINE 2% (20 MG/ML) 5 ML SYRINGE
INTRAMUSCULAR | Status: AC
  Filled 2016-06-15: qty 5

## 2016-06-15 MED ORDER — PROPOFOL 500 MG/50ML IV EMUL
INTRAVENOUS | Status: DC | PRN
  Administered 2016-06-15: 100 ug/kg/min via INTRAVENOUS

## 2016-06-15 MED ORDER — PROPOFOL 10 MG/ML IV BOLUS
INTRAVENOUS | Status: DC | PRN
  Administered 2016-06-15 (×3): 20 mg via INTRAVENOUS
  Administered 2016-06-15: 50 mg via INTRAVENOUS
  Administered 2016-06-15: 20 mg via INTRAVENOUS
  Administered 2016-06-15: 30 mg via INTRAVENOUS
  Administered 2016-06-15: 20 mg via INTRAVENOUS

## 2016-06-15 MED ORDER — SODIUM CHLORIDE 0.9 % IV SOLN: INTRAVENOUS | Status: DC

## 2016-06-15 MED ORDER — ONDANSETRON HCL 4 MG/2ML IJ SOLN
INTRAMUSCULAR | Status: DC | PRN
  Administered 2016-06-15: 4 mg via INTRAVENOUS

## 2016-06-15 MED ORDER — ONDANSETRON HCL 4 MG/2ML IJ SOLN
INTRAMUSCULAR | Status: AC
  Filled 2016-06-15: qty 2

## 2016-06-15 SURGICAL SUPPLY — 21 items

## 2016-06-15 NOTE — Anesthesia Preprocedure Evaluation (Signed)
Anesthesia Evaluation  Patient identified by MRN, date of birth, ID band Patient awake    Reviewed: Allergy & Precautions, NPO status , Patient's Chart, lab work & pertinent test results  Airway Mallampati: II  TM Distance: >3 FB Neck ROM: Full    Dental no notable dental hx.    Pulmonary Current Smoker,    Pulmonary exam normal breath sounds clear to auscultation       Cardiovascular negative cardio ROS Normal cardiovascular exam Rhythm:Regular Rate:Normal     Neuro/Psych negative neurological ROS  negative psych ROS   GI/Hepatic negative GI ROS, Neg liver ROS,   Endo/Other  negative endocrine ROS  Renal/GU negative Renal ROS  negative genitourinary   Musculoskeletal negative musculoskeletal ROS (+)   Abdominal   Peds negative pediatric ROS (+)  Hematology negative hematology ROS (+)   Anesthesia Other Findings   Reproductive/Obstetrics negative OB ROS                             Anesthesia Physical Anesthesia Plan  ASA: II  Anesthesia Plan: MAC   Post-op Pain Management:    Induction: Intravenous  Airway Management Planned: Nasal Cannula  Additional Equipment:   Intra-op Plan:   Post-operative Plan:   Informed Consent: I have reviewed the patients History and Physical, chart, labs and discussed the procedure including the risks, benefits and alternatives for the proposed anesthesia with the patient or authorized representative who has indicated his/her understanding and acceptance.   Dental advisory given  Plan Discussed with: CRNA and Surgeon  Anesthesia Plan Comments:         Anesthesia Quick Evaluation  

## 2016-06-15 NOTE — Anesthesia Postprocedure Evaluation (Signed)
Anesthesia Post Note  Patient: Norma [name]  Procedure(s) Performed: Procedure(s) (LRB): COLONOSCOPY WITH PROPOFOL (N/A)  Patient location during evaluation: PACU Anesthesia Type: MAC Level of consciousness: awake and alert Pain management: pain level controlled Vital Signs Assessment: post-procedure vital signs reviewed and stable Respiratory status: spontaneous breathing, nonlabored ventilation, respiratory function stable and patient connected to nasal cannula oxygen Cardiovascular status: stable and blood pressure returned to baseline Anesthetic complications: no       Last Vitals:  Vitals:   06/15/16 1144 06/15/16 1331  BP: 120/82 (!) 136/103  Pulse:  80  Resp: 16 20  Temp: 37 C     Last Pain:  Vitals:   06/15/16 1331  TempSrc: Oral                 Marjo Grosvenor S

## 2016-06-15 NOTE — Op Note (Signed)
[facility] Patient Name: Norma SlateLisa [name] Procedure Date: 06/15/2016 MRN: [id number] Attending MD: Charolett BumpersMartin K Charlsie Fleeger , MD Date of Birth: 07/19/1960 CSN: [id number] Age: [age] Admit Type: Outpatient Procedure:                Colonoscopy Indications:              High risk colon cancer surveillance: 05/14/2002                            colonoscopy was performed with removal of a 1.5 cm                            sigmoid colon tubular adenomatous polyp Providers:                Charolett BumpersMartin K. Tsuneo Faison, MD, Carin HockSharon Ricketts, RN, Zoila ShutterGary                            Bryant, Technician, Dwain SarnaPatricia Ford, RN Referring MD:              Medicines:                Propofol per Anesthesia Complications:            No immediate complications. Estimated Blood Loss:     Estimated blood loss: none. Procedure:                Pre-Anesthesia Assessment:                           - Prior to the procedure, a History and Physical                            was performed, and patient medications and                            allergies were reviewed. The patient's tolerance of                            previous anesthesia was also reviewed. The risks                            and benefits of the procedure and the sedation                            options and risks were discussed with the patient.                            All questions were answered, and informed consent                            was obtained. Prior Anticoagulants: The patient has                            taken no previous anticoagulant or antiplatelet  agents. ASA Grade Assessment: II - A patient with                            mild systemic disease. After reviewing the risks                            and benefits, the patient was deemed in                            satisfactory condition to undergo the procedure.                           After obtaining informed consent, the colonoscope       was passed under direct vision. Throughout the                            procedure, the patient's blood pressure, pulse, and                            oxygen saturations were monitored continuously. The                            EC-[id number] ([id number]) scope was introduced through                            the anus and advanced to the the cecum, identified                            by appendiceal orifice and ileocecal valve. The                            colonoscopy was performed without difficulty. The                            patient tolerated the procedure well. The quality                            of the bowel preparation was good. The appendiceal                            orifice and the rectum were photographed. Scope In: 1:04:53 PM Scope Out: 1:26:44 PM Scope Withdrawal Time: 0 hours 10 minutes 59 seconds  Total Procedure Duration: 0 hours 21 minutes 51 seconds  Findings:      The perianal and digital rectal examinations were normal.      The entire examined colon appeared normal. Left colonic diverticulosis       was present. Impression:               - The entire examined colon is normal.                           - No specimens collected. Moderate Sedation:      N/A- Per Anesthesia Care Recommendation:           - Patient  has a contact number available for                            emergencies. The signs and symptoms of potential                            delayed complications were discussed with the                            patient. Return to normal activities tomorrow.                            Written discharge instructions were provided to the                            patient.                           - Repeat colonoscopy in 5 years for surveillance.                           - Resume previous diet.                           - Continue present medications. Procedure Code(s):        --- Professional ---                           Z6109, Colorectal cancer  screening; colonoscopy on                            individual at high risk Diagnosis Code(s):        --- Professional ---                           Z86.010, Personal history of colonic polyps CPT copyright 2016 [employer]. All rights reserved. The codes documented in this report are preliminary and upon coder review may  be revised to meet current compliance requirements. Danise Edge, MD Charolett Bumpers, MD 06/15/2016 1:33:26 PM This report has been signed electronically. Number of Addenda: 0

## 2016-06-15 NOTE — Progress Notes (Signed)
Dr. Laural BenesJohnson reviewed abdominal xray results, Patient denies any abdominal pain at this time. Patient ok to discharge home.

## 2016-06-15 NOTE — Discharge Instructions (Signed)

## 2016-06-15 NOTE — Transfer of Care (Signed)
Immediate Anesthesia Transfer of Care Note  Patient: Norma [name]  Procedure(s) Performed: Procedure(s): COLONOSCOPY WITH PROPOFOL (N/A)  Patient Location: PACU  Anesthesia Type:MAC  Level of Consciousness:  sedated, patient cooperative and responds to stimulation  Airway & Oxygen Therapy:Patient Spontanous Breathing and Patient connected to face mask oxgen  Post-op Assessment:  Report given to PACU RN and Post -op Vital signs reviewed and stable  Post vital signs:  Reviewed and stable  Last Vitals:  Vitals:   06/15/16 1144 06/15/16 1331  BP: 120/82 (!) 136/103  Pulse:  80  Resp: 16 20  Temp: 37 C     Complications: No apparent anesthesia complications

## 2016-06-15 NOTE — H&P (Signed)
Procedure: Surveillance colonoscopy. 05/14/2002 colonoscopy was performed with removal of a 1.5 cm tubular adenomatous sigmoid colon polyp  History: The patient is a 56 year old female born 05/08/1960. She is scheduled to undergo a surveillance colonoscopy today.  Past medical history: Hypercholesterolemia. Anxiety with depression. Allergic rhinitis. Urinary urge incontinence.  Medication allergies: Lipitor causes muscle aches  Exam: The patient is alert and lying comfortably on the endoscopy stretcher. Abdomen is soft and nontender to palpation. Cardiac exam reveals a regular rhythm. Lungs are clear to auscultation.  Plan: Proceed with surveillance colonoscopy

## 2016-06-15 NOTE — Progress Notes (Signed)
Patient reported 8/10 abdominal pain in recovery after colonoscopy. Pain worsened with palpation. Abdomen remains soft at this time. Dr. Laural BenesJohnson to assess patient, new order received for STAT abdominal Xray to rule out perforation. Patient taken to radiology reports pain resolved while in radiology.

## 2016-06-16 ENCOUNTER — Encounter (HOSPITAL_COMMUNITY): Payer: Self-pay | Admitting: Gastroenterology

## 2016-06-27 NOTE — Anesthesia Postprocedure Evaluation (Signed)
Anesthesia Post Note  Patient: Teressa SenterLisa C Bonini  Procedure(s) Performed: Procedure(s) (LRB): COLONOSCOPY WITH PROPOFOL (N/A)     Anesthesia Post Evaluation  Last Vitals:  Vitals:   06/15/16 1331 06/15/16 1345  BP: (!) 143/73 125/70  Pulse: 80 64  Resp: 20 16  Temp:      Last Pain:  Vitals:   06/15/16 1345  TempSrc:   PainSc: 2                  Mirella Gueye S

## 2016-06-27 NOTE — Addendum Note (Signed)
Addendum  created 06/27/16 1448 by Arianne Klinge, MD   Sign clinical note    

## 2016-10-24 ENCOUNTER — Encounter (HOSPITAL_COMMUNITY): Payer: Self-pay | Admitting: Emergency Medicine

## 2016-10-24 ENCOUNTER — Emergency Department (HOSPITAL_COMMUNITY): Payer: 59

## 2016-10-24 ENCOUNTER — Emergency Department (HOSPITAL_COMMUNITY)
Admission: EM | Admit: 2016-10-24 | Discharge: 2016-10-24 | Disposition: A | Discharge status: Home / Self Care | Payer: 59 | Attending: Emergency Medicine | Admitting: Emergency Medicine

## 2016-10-24 DIAGNOSIS — Z23 Encounter for immunization: Secondary | ICD-10-CM | POA: Insufficient documentation

## 2016-10-24 DIAGNOSIS — Z79899 Other long term (current) drug therapy: Secondary | ICD-10-CM | POA: Insufficient documentation

## 2016-10-24 DIAGNOSIS — Y998 Other external cause status: Secondary | ICD-10-CM | POA: Diagnosis not present

## 2016-10-24 DIAGNOSIS — Y9389 Activity, other specified: Secondary | ICD-10-CM | POA: Insufficient documentation

## 2016-10-24 DIAGNOSIS — S6991XA Unspecified injury of right wrist, hand and finger(s), initial encounter: Secondary | ICD-10-CM | POA: Diagnosis present

## 2016-10-24 DIAGNOSIS — F17210 Nicotine dependence, cigarettes, uncomplicated: Secondary | ICD-10-CM | POA: Insufficient documentation

## 2016-10-24 DIAGNOSIS — S61451A Open bite of right hand, initial encounter: Secondary | ICD-10-CM | POA: Insufficient documentation

## 2016-10-24 DIAGNOSIS — Y929 Unspecified place or not applicable: Secondary | ICD-10-CM | POA: Diagnosis not present

## 2016-10-24 DIAGNOSIS — S52611C Displaced fracture of right ulna styloid process, initial encounter for open fracture type IIIA, IIIB, or IIIC: Secondary | ICD-10-CM | POA: Insufficient documentation

## 2016-10-24 DIAGNOSIS — W540XXA Bitten by dog, initial encounter: Secondary | ICD-10-CM | POA: Insufficient documentation

## 2016-10-24 MED ORDER — SODIUM CHLORIDE 0.9 % IV SOLN
1.5000 g | Freq: Once | INTRAVENOUS | Status: AC
  Administered 2016-10-24: 1.5 g via INTRAVENOUS
  Filled 2016-10-24: qty 1.5

## 2016-10-24 MED ORDER — OXYCODONE-ACETAMINOPHEN 5-325 MG PO TABS: 1.0000 | ORAL_TABLET | Freq: Four times a day (QID) | ORAL | 0 refills | Status: DC | PRN

## 2016-10-24 MED ORDER — DIPHENHYDRAMINE HCL 50 MG/ML IJ SOLN
25.0000 mg | Freq: Once | INTRAMUSCULAR | Status: AC
  Administered 2016-10-24: 25 mg via INTRAVENOUS
  Filled 2016-10-24: qty 1

## 2016-10-24 MED ORDER — OXYCODONE-ACETAMINOPHEN 5-325 MG PO TABS
1.0000 | ORAL_TABLET | ORAL | Status: DC | PRN
Start: 2016-10-24 — End: 2016-10-24
  Administered 2016-10-24: 1 via ORAL

## 2016-10-24 MED ORDER — MORPHINE SULFATE (PF) 4 MG/ML IV SOLN
4.0000 mg | Freq: Once | INTRAVENOUS | Status: AC
  Administered 2016-10-24: 4 mg via INTRAVENOUS
  Filled 2016-10-24: qty 1

## 2016-10-24 MED ORDER — MORPHINE SULFATE (PF) 4 MG/ML IV SOLN
INTRAVENOUS | Status: AC
  Filled 2016-10-24: qty 1

## 2016-10-24 MED ORDER — AMOXICILLIN-POT CLAVULANATE 875-125 MG PO TABS: 1.0000 | ORAL_TABLET | Freq: Two times a day (BID) | ORAL | 0 refills | Status: DC

## 2016-10-24 MED ORDER — MORPHINE SULFATE (PF) 4 MG/ML IV SOLN
4.0000 mg | Freq: Once | INTRAVENOUS | Status: AC
Start: 2016-10-24 — End: 2016-10-24
  Administered 2016-10-24: 4 mg via INTRAVENOUS

## 2016-10-24 MED ORDER — TETANUS-DIPHTH-ACELL PERTUSSIS 5-2.5-18.5 LF-MCG/0.5 IM SUSP
0.5000 mL | Freq: Once | INTRAMUSCULAR | Status: AC
  Administered 2016-10-24: 0.5 mL via INTRAMUSCULAR
  Filled 2016-10-24: qty 0.5

## 2016-10-24 MED ORDER — OXYCODONE-ACETAMINOPHEN 5-325 MG PO TABS
ORAL_TABLET | ORAL | Status: AC
  Filled 2016-10-24: qty 1

## 2016-10-24 MED ORDER — ONDANSETRON HCL 4 MG/2ML IJ SOLN
4.0000 mg | Freq: Once | INTRAMUSCULAR | Status: AC
  Administered 2016-10-24: 4 mg via INTRAVENOUS
  Filled 2016-10-24: qty 2

## 2016-10-24 NOTE — Discharge Instructions (Signed)
You were seen today for a dog bite. You also have an associated fracture. Follow-up closely with hand surgery. You are at high risk for infection. Take antibiotics as instructed. If you observe increasing redness, fevers, or any new or worsening symptoms you should be reevaluated.

## 2016-10-24 NOTE — ED Triage Notes (Signed)
Pt reports dog bite by her dog (who is up to date on rabies injections) PTA. States "it was squirting" applied towel dressing. 3 wounds to R anterior wrist, 2 wounds to posterior R wrist. No other wounds. Decreased strength and sensation in R hand, some swelling noted to L medial wrist. Bleeding controlled at this time, nonadherent dressing applied in triage. Fingers have good color/cap refill however decreased sensation . Pt very sensitive to touch, unable to tolerate most of physical exam in triage.   Last TDAP 5 years ago.

## 2016-10-24 NOTE — ED Provider Notes (Signed)
[facility]-EMERGENCY DEPT Provider Note   CSN: [id number] Arrival date & time: 10/24/16  0050     History   Chief Complaint Chief Complaint  Patient presents with  . Animal Bite    possible ulnar fx    HPI Norma [name] is a 56 y.o. female.  HPI  This is a 56 year old female who presents with right hand injury. Patient reports that her dogs were fighting when she attempted to break up a fight. One of her dogs accidentally bit her right wrist. She reports 10 out of 10 pain. Tetanus was 5 years ago. Reports dogs are up-to-date on vaccinations including rabies. Denies other injury. She noted significant amount of leading. She is right-handed.  Past Medical History:  Diagnosis Date  . Anxiety   . Depression   . PONV (postoperative nausea and vomiting)   . Snores   . Volar retinacular ganglion     Patient Active Problem List   Diagnosis Date Noted  . Radiculopathy 04/10/2013    Past Surgical History:  Procedure Laterality Date  . ABDOMINAL HYSTERECTOMY    . ANTERIOR CERVICAL DECOMP/DISCECTOMY FUSION N/A 04/10/2013   Procedure: ANTERIOR CERVICAL DECOMPRESSION/DISCECTOMY FUSION 1 LEVEL;  Surgeon: Emilee Hero, MD;  Location: [facility] OR;  Service: Orthopedics;  Laterality: N/A;  Anterior cervical decompression fusion, cervical 6-7 with instrumentation and allograft  . APPENDECTOMY    . BARTHOLIN CYST MARSUPIALIZATION N/A 03/07/2013   Procedure: BARTHOLIN CYST MARSUPIALIZATION;  Surgeon: Sharon Seller, DO;  Location: [facility] ORS;  Service: Gynecology;  Laterality: N/A;  . COLONOSCOPY    . COLONOSCOPY WITH PROPOFOL N/A 06/15/2016   Procedure: COLONOSCOPY WITH PROPOFOL;  Surgeon: Charolett Bumpers, MD;  Location: [facility] ENDOSCOPY;  Service: Endoscopy;  Laterality: N/A;  . INCISION AND DRAINAGE HIP     RT  . SHOULDER ARTHROSCOPY     RT  . WRIST GANGLION EXCISION     X2 RT WRIST, X1 LT WRIST    OB History    No data available       Home Medications    Prior to Admission  medications   Medication Sig Start Date End Date Taking? Authorizing Provider  buPROPion (WELLBUTRIN SR) 150 MG 12 hr tablet Take 150 mg by mouth every evening.    [provider]  Cholecalciferol 4000 units CAPS Take 4,000 Units by mouth every evening.    [provider]  citalopram (CELEXA) 40 MG tablet Take 40 mg by mouth every evening.    [provider]  clonazePAM (KLONOPIN) 1 MG tablet Take 1 mg by mouth at bedtime.     [provider]  estradiol (ESTRACE) 1 MG tablet Take 1 mg by mouth every evening.     [provider]  fenofibrate 160 MG tablet Take 160 mg by mouth daily.    [provider]  ibuprofen (ADVIL,MOTRIN) 800 MG tablet Take 1 tablet (800 mg total) by mouth 3 (three) times daily. Patient taking differently: Take 800 mg by mouth daily as needed for moderate pain.  04/15/16   Long, Arlyss Repress, MD  Omega-3 Fatty Acids (FISH OIL PO) Take 2 capsules by mouth every evening.    [provider]    Family History Family History  Problem Relation Age of Onset  . Cancer Mother   . Cancer Father     Social History Social History  Substance Use Topics  . Smoking status: Current Every Day Smoker    Packs/day: 0.25    Types: Cigarettes  .  Smokeless tobacco: Never Used  . Alcohol use Yes     Comment: SOCIAL     Allergies   Codeine and Other   Review of Systems Review of Systems  Musculoskeletal:       Right wrist pain  Skin: Positive for wound. Negative for color change.  Neurological: Negative for weakness and numbness.  All other systems reviewed and are negative.    Physical Exam Updated Vital Signs BP (!) 189/96 (BP Location: Left Arm)   Pulse 94   Temp (!) 97.5 F (36.4 C) (Oral)   Resp 19   Ht  (1.651 m)   Wt 85.7 kg (189 lb)   SpO2 98%   BMI 31.45 kg/m   Physical Exam  Constitutional: She is oriented to person, place, and time.  Overweight, no acute distress  HENT:  Head:  Normocephalic and atraumatic.  Cardiovascular: Normal rate and regular rhythm.   Pulmonary/Chest: Effort normal. No respiratory distress.  Musculoskeletal:  Limited range of motion of the right wrist secondary to pain, mild swelling noted, multiple puncture wounds over the ulnar aspect of the wrists, one 3 cm laceration over the dorsal aspect of the wrist just adjacent to the distal ulna, second 3 cm laceration over the palmar aspect of the wrist just proximal, several punctate puncture wounds, no active bleeding, 2+ radial and ulnar pulse, neurovascularly intact distally  Neurological: She is alert and oriented to person, place, and time.  Skin: Skin is warm and dry.  Psychiatric: She has a normal mood and affect.  Nursing note and vitals reviewed.    ED Treatments / Results  Labs (all labs ordered are listed, but only abnormal results are displayed) Labs Reviewed - No data to display  EKG  EKG Interpretation None       Radiology Dg Wrist Complete Right  Result Date: 10/24/2016 CLINICAL DATA:  Dog bite. EXAM: RIGHT WRIST - COMPLETE 3+ VIEW COMPARISON:  None. FINDINGS: Soft tissue swelling and defect over the ulnar region of the right wrist consistent with history of penetrating injury. Fracture of the ulnar styloid process. No radiopaque soft tissue foreign bodies. Degenerative changes noted in the radiocarpal and STT joints. IMPRESSION: Soft tissue injury to the ulnar aspect of the right wrist. Fracture of the ulnar styloid process. Electronically Signed   By: Burman Nieves M.D.   On: 10/24/2016 01:40    Procedures .Splint Application Date/Time: 10/24/2016 4:53 AM Performed by: Shon Baton Authorized by: Shon Baton   Consent:    Consent obtained:  Verbal   Consent given by:  Patient   Risks discussed:  Numbness and pain   Alternatives discussed:  No treatment Pre-procedure details:    Sensation:  Normal Procedure details:    Laterality:  Right    Location:  Wrist   Wrist:  R wrist   Cast type:  Short arm   Splint type:  Ulnar gutter Post-procedure details:    Pain:  Unchanged   Sensation:  Normal   Patient tolerance of procedure:  Tolerated well, no immediate complications Wound repair Date/Time: 10/24/2016 4:55 AM Performed by: Shon Baton Authorized by: Shon Baton  Consent: Verbal consent obtained. Risks and benefits: risks, benefits and alternatives were discussed Local anesthesia used: no  Anesthesia: Local anesthesia used: no  Sedation: Patient sedated: no Patient tolerance: Patient tolerated the procedure well with no immediate complications Comments: Wounds were cleaned. Wound is soaked in 1 L of saline with Betadine. An additional 1 L  saline was used to irrigate the wounds individually. Primary closure was not attempted.    (including critical care time)  Medications Ordered in ED Medications  oxyCODONE-acetaminophen (PERCOCET/ROXICET) 5-325 MG per tablet 1 tablet (1 tablet Oral Given 10/24/16 0104)  oxyCODONE-acetaminophen (PERCOCET/ROXICET) 5-325 MG per tablet (not administered)  ampicillin-sulbactam (UNASYN) 1.5 g in sodium chloride 0.9 % 50 mL IVPB (1.5 g Intravenous New Bag/Given 10/24/16 0434)  morphine 4 MG/ML injection (not administered)  diphenhydrAMINE (BENADRYL) injection 25 mg (not administered)  morphine 4 MG/ML injection 4 mg (4 mg Intravenous Given 10/24/16 0324)  ondansetron (ZOFRAN) injection 4 mg (4 mg Intravenous Given 10/24/16 0322)  Tdap (BOOSTRIX) injection 0.5 mL (0.5 mLs Intramuscular Given 10/24/16 0328)  morphine 4 MG/ML injection 4 mg (4 mg Intravenous Given 10/24/16 0408)     Initial Impression / Assessment and Plan / ED Course  I have reviewed the triage vital signs and the nursing notes.  Pertinent labs & imaging results that were available during my care of the patient were reviewed by me and considered in my medical decision making (see chart for details).      Patient presents with a dog bite and open fracture of the right ulnar styloid. She is nontoxic-appearing. Neurovascularly intact. Patient reports that she has previously seen Dr.  Luiz Blare with [facility] and Dr. [facility], neither of whom are on tonight. I discussed with patient her preference for orthopedist. She has not seen Dr.  Amanda Pea in 5 years.  Patient prefers consultation with on-call hand.  Discussed the patient with Dr Jena Gauss.   He reviewed the images. Recommends a piece irrigation, IV antibiotics, splinting and close follow-up. Discharge with by mouth antibiotics. Wound was copiously cleaned.  Patient was given pain medication. She was given instructions regarding any evidence of infection.  Her tetanus was updated.  After history, exam, and medical workup I feel the patient has been appropriately medically screened and is safe for discharge home. Pertinent diagnoses were discussed with the patient. Patient was given return precautions.   Final Clinical Impressions(s) / ED Diagnoses   Final diagnoses:  Dog bite, initial encounter  Type III open fracture of right ulnar styloid, initial encounter    New Prescriptions New Prescriptions   No medications on file     Shon Baton, MD 10/24/16 [phone]

## 2016-10-24 NOTE — ED Notes (Signed)
Unable to sign discharge instructions, pt and husband verbalized discharge instructions including medication and pain control.

## 2016-11-29 ENCOUNTER — Other Ambulatory Visit: Payer: Self-pay | Admitting: Orthopedic Surgery

## 2016-11-29 DIAGNOSIS — S83241S Other tear of medial meniscus, current injury, right knee, sequela: Secondary | ICD-10-CM

## 2017-05-26 ENCOUNTER — Other Ambulatory Visit: Payer: Self-pay | Admitting: Orthopedic Surgery

## 2017-05-26 DIAGNOSIS — M25531 Pain in right wrist: Secondary | ICD-10-CM

## 2017-06-06 ENCOUNTER — Ambulatory Visit
Admission: RE | Admit: 2017-06-06 | Discharge: 2017-06-06 | Disposition: A | Discharge status: Home / Self Care | Payer: 59 | Source: Ambulatory Visit | Attending: Orthopedic Surgery | Admitting: Orthopedic Surgery

## 2017-06-06 DIAGNOSIS — M25531 Pain in right wrist: Secondary | ICD-10-CM

## 2017-06-06 MED ORDER — IOPAMIDOL (ISOVUE-M 200) INJECTION 41%
12.0000 mL | Freq: Once | INTRAMUSCULAR | Status: AC
  Administered 2017-06-06: 12 mL via INTRA_ARTICULAR

## 2017-09-18 ENCOUNTER — Other Ambulatory Visit: Payer: Self-pay | Admitting: Internal Medicine

## 2017-09-18 DIAGNOSIS — Z1231 Encounter for screening mammogram for malignant neoplasm of breast: Secondary | ICD-10-CM

## 2017-09-19 ENCOUNTER — Other Ambulatory Visit: Payer: Self-pay | Admitting: Internal Medicine

## 2017-09-19 DIAGNOSIS — R918 Other nonspecific abnormal finding of lung field: Secondary | ICD-10-CM

## 2017-10-16 ENCOUNTER — Ambulatory Visit: Payer: 59

## 2017-11-08 DIAGNOSIS — R0683 Snoring: Secondary | ICD-10-CM | POA: Diagnosis not present

## 2017-11-09 DIAGNOSIS — R0681 Apnea, not elsewhere classified: Secondary | ICD-10-CM | POA: Diagnosis not present

## 2017-11-10 ENCOUNTER — Ambulatory Visit
Admission: RE | Admit: 2017-11-10 | Discharge: 2017-11-10 | Disposition: A | Discharge status: Home / Self Care | Payer: 59 | Source: Ambulatory Visit | Attending: Internal Medicine | Admitting: Internal Medicine

## 2017-11-10 DIAGNOSIS — R918 Other nonspecific abnormal finding of lung field: Secondary | ICD-10-CM

## 2017-11-20 ENCOUNTER — Other Ambulatory Visit (HOSPITAL_BASED_OUTPATIENT_CLINIC_OR_DEPARTMENT_OTHER): Payer: Self-pay

## 2017-11-20 DIAGNOSIS — G4710 Hypersomnia, unspecified: Secondary | ICD-10-CM

## 2017-11-20 DIAGNOSIS — R0683 Snoring: Secondary | ICD-10-CM

## 2017-11-20 DIAGNOSIS — G4730 Sleep apnea, unspecified: Secondary | ICD-10-CM

## 2017-12-27 ENCOUNTER — Emergency Department (HOSPITAL_BASED_OUTPATIENT_CLINIC_OR_DEPARTMENT_OTHER)
Admission: EM | Admit: 2017-12-27 | Discharge: 2017-12-27 | Disposition: A | Discharge status: Home / Self Care | Payer: BLUE CROSS/BLUE SHIELD | Attending: Emergency Medicine | Admitting: Emergency Medicine

## 2017-12-27 ENCOUNTER — Other Ambulatory Visit: Payer: Self-pay

## 2017-12-27 ENCOUNTER — Emergency Department (HOSPITAL_BASED_OUTPATIENT_CLINIC_OR_DEPARTMENT_OTHER): Payer: BLUE CROSS/BLUE SHIELD

## 2017-12-27 ENCOUNTER — Encounter (HOSPITAL_BASED_OUTPATIENT_CLINIC_OR_DEPARTMENT_OTHER): Payer: Self-pay | Admitting: *Deleted

## 2017-12-27 DIAGNOSIS — R112 Nausea with vomiting, unspecified: Secondary | ICD-10-CM | POA: Insufficient documentation

## 2017-12-27 DIAGNOSIS — F329 Major depressive disorder, single episode, unspecified: Secondary | ICD-10-CM | POA: Diagnosis not present

## 2017-12-27 DIAGNOSIS — R42 Dizziness and giddiness: Secondary | ICD-10-CM | POA: Diagnosis not present

## 2017-12-27 DIAGNOSIS — R110 Nausea: Secondary | ICD-10-CM | POA: Diagnosis not present

## 2017-12-27 DIAGNOSIS — F419 Anxiety disorder, unspecified: Secondary | ICD-10-CM | POA: Diagnosis not present

## 2017-12-27 DIAGNOSIS — F17210 Nicotine dependence, cigarettes, uncomplicated: Secondary | ICD-10-CM | POA: Diagnosis not present

## 2017-12-27 LAB — URINALYSIS, ROUTINE W REFLEX MICROSCOPIC
Bilirubin Urine: NEGATIVE
Glucose, UA: NEGATIVE mg/dL
Ketones, ur: NEGATIVE mg/dL
Leukocytes, UA: NEGATIVE
Nitrite: NEGATIVE
Protein, ur: NEGATIVE mg/dL
pH: 6 (ref 5.0–8.0)

## 2017-12-27 LAB — COMPREHENSIVE METABOLIC PANEL
ALBUMIN: 4.4 g/dL (ref 3.5–5.0)
ALT: 47 U/L — ABNORMAL HIGH (ref 0–44)
AST: 42 U/L — ABNORMAL HIGH (ref 15–41)
Alkaline Phosphatase: 60 U/L (ref 38–126)
Anion gap: 11 (ref 5–15)
BILIRUBIN TOTAL: 0.5 mg/dL (ref 0.3–1.2)
BUN: 16 mg/dL (ref 6–20)
CO2: 23 mmol/L (ref 22–32)
Calcium: 9.3 mg/dL (ref 8.9–10.3)
Chloride: 103 mmol/L (ref 98–111)
Creatinine, Ser: 0.73 mg/dL (ref 0.44–1.00)
GFR calc Af Amer: >60 mL/min (ref >60)
GFR calc non Af Amer: >60 mL/min (ref >60)
GLUCOSE: 121 mg/dL — AB (ref 70–99)
Potassium: 3.5 mmol/L (ref 3.5–5.1)
Sodium: 137 mmol/L (ref 135–145)
Total Protein: 7.9 g/dL (ref 6.5–8.1)

## 2017-12-27 LAB — CBC
HCT: 42.9 % (ref 36.0–46.0)
Hemoglobin: 14.1 g/dL (ref 12.0–15.0)
MCH: 31.1 pg (ref 26.0–34.0)
MCHC: 32.9 g/dL (ref 30.0–36.0)
MCV: 94.7 fL (ref 80.0–100.0)
Platelets: 288 10*3/uL (ref 150–400)
RBC: 4.53 MIL/uL (ref 3.87–5.11)
RDW: 13.2 % (ref 11.5–15.5)
WBC: 15.4 10*3/uL — ABNORMAL HIGH (ref 4.0–10.5)
nRBC: 0 % (ref 0.0–0.2)

## 2017-12-27 LAB — LIPASE, BLOOD: Lipase: 26 U/L (ref 11–51)

## 2017-12-27 LAB — URINALYSIS, MICROSCOPIC (REFLEX)

## 2017-12-27 LAB — PREGNANCY, URINE: Preg Test, Ur: NEGATIVE

## 2017-12-27 LAB — TROPONIN I: Troponin I: <0.03 ng/mL (ref <0.03)

## 2017-12-27 MED ORDER — MECLIZINE HCL 25 MG PO TABS: 25.0000 mg | ORAL_TABLET | Freq: Three times a day (TID) | ORAL | 0 refills | Status: DC | PRN

## 2017-12-27 MED ORDER — ONDANSETRON HCL 4 MG/2ML IJ SOLN
4.0000 mg | Freq: Once | INTRAMUSCULAR | Status: AC | PRN
  Administered 2017-12-27: 4 mg via INTRAVENOUS
  Filled 2017-12-27: qty 2

## 2017-12-27 MED ORDER — MECLIZINE HCL 25 MG PO TABS
25.0000 mg | ORAL_TABLET | Freq: Once | ORAL | Status: AC
  Administered 2017-12-27: 25 mg via ORAL
  Filled 2017-12-27: qty 1

## 2017-12-27 MED ORDER — ONDANSETRON 4 MG PO TBDP: 4.0000 mg | ORAL_TABLET | Freq: Three times a day (TID) | ORAL | 0 refills | Status: DC | PRN

## 2017-12-27 MED ORDER — LORAZEPAM 2 MG/ML IJ SOLN
1.0000 mg | Freq: Once | INTRAMUSCULAR | Status: AC
  Administered 2017-12-27: 1 mg via INTRAVENOUS
  Filled 2017-12-27: qty 1

## 2017-12-27 NOTE — ED Notes (Signed)
Pt arrives in obvious distress, pt is coughing and having dry heaves.  Pt is very sweaty and reports tingling in hands (note constant coughing and dry heaving).  Pt eyes look bouncing right to left.  Pt reports that the room is spinning.  Pt was incontinent of one large formed stool (pty was cleaned up and clothing bagged).  Pt became calm after ativan was given and tolerated being cleaned up and repostioned.  Cool rag placed on back of neck and forehead for comfort.  Covered with sheet only as pt reported feeling hot.

## 2017-12-27 NOTE — ED Triage Notes (Signed)
Pt has had blurred vision and emesis for 2 hrs

## 2017-12-27 NOTE — ED Provider Notes (Signed)
Emergency Department Provider Note   I have reviewed the triage vital signs and the nursing notes.   HISTORY  Chief Complaint Emesis and Blurred Vision   HPI Norma [name] is a 57 y.o. female with PMH of depression, anxiety, and radiculopathy resents to the emergency department for evaluation of acute onset vertigo symptoms with nausea vomiting.  The patient describes the sensation of vertigo both at rest and with movement.  She continues to have vertigo sensation with eyes closed or open although notes it is worse when her eyes are open.  She is feeling like her vision may be blurry but denies double vision.  No weakness or numbness but has had significant coughing with vomiting during these episodes will feel tingling in both arms.  No unilateral numbness or weakness.  No prior history of vertigo.  Husband at bedside states that she has had 1-2 near syncope events in the last 2 months.  He states that initially she was complaining of those type symptoms and then began having much more persistent symptoms.  She notes some headache and chest discomfort as well.    Past Medical History:  Diagnosis Date  . Anxiety   . Depression   . PONV (postoperative nausea and vomiting)   . Snores   . Volar retinacular ganglion     Patient Active Problem List   Diagnosis Date Noted  . Radiculopathy 04/10/2013    Past Surgical History:  Procedure Laterality Date  . ABDOMINAL HYSTERECTOMY    . ANTERIOR CERVICAL DECOMP/DISCECTOMY FUSION N/A 04/10/2013   Procedure: ANTERIOR CERVICAL DECOMPRESSION/DISCECTOMY FUSION 1 LEVEL;  Surgeon: Emilee Hero, MD;  Location: [facility];  Service: Orthopedics;  Laterality: N/A;  Anterior cervical decompression fusion, cervical 6-7 with instrumentation and allograft  . APPENDECTOMY    . BARTHOLIN CYST MARSUPIALIZATION N/A 03/07/2013   Procedure: BARTHOLIN CYST MARSUPIALIZATION;  Surgeon: Sharon Seller, DO;  Location: [facility];  Service: Gynecology;   Laterality: N/A;  . COLONOSCOPY    . COLONOSCOPY WITH PROPOFOL N/A 06/15/2016   Procedure: COLONOSCOPY WITH PROPOFOL;  Surgeon: Charolett Bumpers, MD;  Location: [facility];  Service: Endoscopy;  Laterality: N/A;  . INCISION AND DRAINAGE HIP     RT  . SHOULDER ARTHROSCOPY     RT  . WRIST GANGLION EXCISION     X2 RT WRIST, X1 LT WRIST    Allergies Codeine and Other  Family History  Problem Relation Age of Onset  . Cancer Mother   . Cancer Father     Social History Social History   Tobacco Use  . Smoking status: Current Every Day Smoker    Packs/day: 0.25    Types: Cigarettes  . Smokeless tobacco: Never Used  Substance Use Topics  . Alcohol use: Yes    Comment: SOCIAL  . Drug use: No    Review of Systems  Constitutional: No fever/chills Eyes: Positive blurry vision.  ENT: No sore throat.  Cardiovascular: Positive chest pain. Respiratory: Denies shortness of breath. Gastrointestinal: No abdominal pain. Positive nausea and vomiting.  No diarrhea.  No constipation. Genitourinary: Negative for dysuria. Musculoskeletal: Negative for back pain. Skin: Negative for rash. Neurological: Negative for headaches, focal weakness or numbness.  10-point ROS otherwise negative.  ____________________________________________   PHYSICAL EXAM:  VITAL SIGNS: ED Triage Vitals  Enc Vitals Group     BP 12/27/17 1546 (!) 168/92     Pulse Rate 12/27/17 1546 96     Resp 12/27/17 1546 (!) 24  Temp 12/27/17 1546 (!) 97.2 F (36.2 C)     Temp Source 12/27/17 1546 Oral     SpO2 12/27/17 1546 99 %     Weight 12/27/17 1537 210 lb (95.3 kg)     Height 12/27/17 1537 5' 5.5" (1.664 m)     Pain Score 12/27/17 1537 8   Constitutional: Alert and oriented. Slightly diaphoretic and short with history. Appears uncomfortable.  Eyes: Conjunctivae are normal. PERRL. Horizontal nystagmus noted with looking either right or left.  Head: Atraumatic. Nose: No  congestion/rhinnorhea. Mouth/Throat: Mucous membranes are moist.  Oropharynx non-erythematous. Neck: No stridor.   Cardiovascular: Normal rate, regular rhythm. Good peripheral circulation. Grossly normal heart sounds.   Respiratory: Normal respiratory effort.  No retractions. Lungs CTAB. Gastrointestinal: Soft and nontender. No distention.  Musculoskeletal: No lower extremity tenderness nor edema. No gross deformities of extremities. Neurologic:  Normal speech and language. Normal CN exam 2-12. Some nystagmus as noted above. No clear difficulty with finger to nose but patient feeling too bad to fully participate.  Skin:  Skin is warm, dry and intact. No rash noted.  ____________________________________________   LABS (all labs ordered are listed, but only abnormal results are displayed)  Labs Reviewed  COMPREHENSIVE METABOLIC PANEL - Abnormal; Notable for the following components:      Result Value   Glucose, Bld 121 (*)    AST 42 (*)    ALT 47 (*)    All other components within normal limits  CBC - Abnormal; Notable for the following components:   WBC 15.4 (*)    All other components within normal limits  URINALYSIS, ROUTINE W REFLEX MICROSCOPIC - Abnormal; Notable for the following components:   Specific Gravity, Urine >1.030 (*)    Hgb urine dipstick TRACE (*)    All other components within normal limits  URINALYSIS, MICROSCOPIC (REFLEX) - Abnormal; Notable for the following components:   Bacteria, UA MANY (*)    All other components within normal limits  LIPASE, BLOOD  PREGNANCY, URINE  TROPONIN I   ____________________________________________  EKG   EKG Interpretation  Date/Time:  Wednesday December 27 2017 16:03:19 EST Ventricular Rate:  67 PR Interval:    QRS Duration: 86 QT Interval:  470 QTC Calculation: 497 R Axis:   57 Text Interpretation:  Sinus rhythm Borderline prolonged QT interval No STEMI.  Confirmed by Alona Bene [phone]) on 12/27/2017 4:09:24 PM        ____________________________________________  RADIOLOGY  Ct Head Wo Contrast  Result Date: 12/27/2017 CLINICAL DATA:  Dizziness and nausea. Patient states that her eyesight feels like it is bouncing left to right x1 week. EXAM: CT HEAD WITHOUT CONTRAST TECHNIQUE: Contiguous axial images were obtained from the base of the skull through the vertex without intravenous contrast. COMPARISON:  None. FINDINGS: Brain: Mild involutional changes of the brain. No acute intracranial hemorrhage, midline shift or edema. No large vascular territory infarct. No intra-axial mass or extra-axial fluid. Patent basal cisterns. Intact brainstem and cerebellum. Vascular: No hyperdense vessel sign. Skull: Normal. Negative for fracture or focal lesion. Sinuses/Orbits: No acute finding. Other: None. IMPRESSION: No acute intracranial abnormality. Electronically Signed   By: Tollie Eth M.D.   On: 12/27/2017 16:44    ____________________________________________   PROCEDURES  Procedure(s) performed:   Procedures  None ____________________________________________   INITIAL IMPRESSION / ASSESSMENT AND PLAN / ED COURSE  Pertinent labs & imaging results that were available during my care of the patient were reviewed by me and considered in my  medical decision making (see chart for details).  Patient presents to the emergency department with severe vertigo symptoms.  Patient has difficulty describing her vision change.  Seems mostly blurry but also describing significant vertigo type spinning/movement symptoms.  No focal deficits but patient's degree of nausea does limit the initial exam.  My suspicion for central cause of symptoms is low at this time.  With no other findings on neuro exam I do not feel this meets criteria to activate a code stroke and consider tPA but will follow symptoms closely after medication, labs, CT imaging of the head.   Patient labs and imaging reviewed. Patient feeling much better  after medication. Able to complete neuro exam with normal gait, finger-to-nose, heel-to-shin. No pronator drift. Very low suspicion for central vertigo. No further imaging at this time. Plan for symptom mgmt at home and ENT/PCP follow up. Discussed ED return precautions in detail with patient and husband at bedside.   ____________________________________________  FINAL CLINICAL IMPRESSION(S) / ED DIAGNOSES  Final diagnoses:  Vertigo  Non-intractable vomiting with nausea, unspecified vomiting type     MEDICATIONS GIVEN DURING THIS VISIT:  Medications  ondansetron (ZOFRAN) injection 4 mg (4 mg Intravenous Given 12/27/17 1546)  LORazepam (ATIVAN) injection 1 mg (1 mg Intravenous Given 12/27/17 1606)  meclizine (ANTIVERT) tablet 25 mg (25 mg Oral Given 12/27/17 1606)     NEW OUTPATIENT MEDICATIONS STARTED DURING THIS VISIT:  Discharge Medication List as of 12/27/2017  6:11 PM    START taking these medications   Details  meclizine (ANTIVERT) 25 MG tablet Take 1 tablet (25 mg total) by mouth 3 (three) times daily as needed for dizziness., Starting Wed 12/27/2017, Print    ondansetron (ZOFRAN ODT) 4 MG disintegrating tablet Take 1 tablet (4 mg total) by mouth every 8 (eight) hours as needed., Starting Wed 12/27/2017, Print        Note:  This document was prepared using Dragon voice recognition software and may include unintentional dictation errors.  Alona BeneJoshua Croix Presley, MD Emergency Medicine    Shaquile Lutze, Arlyss RepressJoshua G, MD 12/28/17 [phone]

## 2017-12-27 NOTE — ED Notes (Signed)
Pt was able to ambulate to the bathroom to void.  No distress, tolerated this well.

## 2017-12-27 NOTE — Discharge Instructions (Signed)
We believe your symptoms were caused by benign vertigo.  Please read through the included information and take any prescribed medication(s).  Follow up with your doctor as listed above.  If you develop any new or worsening symptoms that concern you, including but not limited to persistent dizziness/vertigo, numbness or weakness in your arms or legs, altered mental status, persistent vomiting, or fever greater than 101, please return immediately to the Emergency Department.  

## 2017-12-27 NOTE — ED Notes (Signed)
Pt has been sleeping, MD checks on pt and when she awakens she reports that she is feeling much better.  Eyes are still.  Pt reports that dizziness has subsided. Blankets given.  Pt sat up in bed. Ginger ale given.

## 2018-01-15 ENCOUNTER — Inpatient Hospital Stay: Admission: RE | Admit: 2018-01-15 | Payer: BLUE CROSS/BLUE SHIELD | Source: Ambulatory Visit

## 2018-01-22 ENCOUNTER — Other Ambulatory Visit: Payer: Self-pay | Admitting: Internal Medicine

## 2018-01-22 DIAGNOSIS — R918 Other nonspecific abnormal finding of lung field: Secondary | ICD-10-CM

## 2018-01-30 ENCOUNTER — Ambulatory Visit (HOSPITAL_BASED_OUTPATIENT_CLINIC_OR_DEPARTMENT_OTHER): Payer: BLUE CROSS/BLUE SHIELD | Attending: Internal Medicine

## 2018-02-06 ENCOUNTER — Other Ambulatory Visit: Payer: Self-pay | Admitting: Internal Medicine

## 2018-02-06 DIAGNOSIS — Z1231 Encounter for screening mammogram for malignant neoplasm of breast: Secondary | ICD-10-CM

## 2018-02-08 ENCOUNTER — Ambulatory Visit: Payer: BLUE CROSS/BLUE SHIELD

## 2018-02-13 ENCOUNTER — Other Ambulatory Visit: Payer: BLUE CROSS/BLUE SHIELD

## 2018-02-13 ENCOUNTER — Ambulatory Visit
Admission: RE | Admit: 2018-02-13 | Discharge: 2018-02-13 | Disposition: A | Discharge status: Home / Self Care | Payer: BLUE CROSS/BLUE SHIELD | Source: Ambulatory Visit | Attending: Internal Medicine | Admitting: Internal Medicine

## 2018-02-13 DIAGNOSIS — R918 Other nonspecific abnormal finding of lung field: Secondary | ICD-10-CM | POA: Diagnosis not present

## 2018-02-13 DIAGNOSIS — Z1231 Encounter for screening mammogram for malignant neoplasm of breast: Secondary | ICD-10-CM

## 2018-02-14 ENCOUNTER — Other Ambulatory Visit: Payer: Self-pay | Admitting: Internal Medicine

## 2018-02-14 DIAGNOSIS — N63 Unspecified lump in unspecified breast: Secondary | ICD-10-CM

## 2018-02-15 ENCOUNTER — Ambulatory Visit
Admission: RE | Admit: 2018-02-15 | Discharge: 2018-02-15 | Disposition: A | Discharge status: Home / Self Care | Payer: BLUE CROSS/BLUE SHIELD | Source: Ambulatory Visit | Attending: Internal Medicine | Admitting: Internal Medicine

## 2018-02-15 DIAGNOSIS — R928 Other abnormal and inconclusive findings on diagnostic imaging of breast: Secondary | ICD-10-CM | POA: Diagnosis not present

## 2018-02-15 DIAGNOSIS — N63 Unspecified lump in unspecified breast: Secondary | ICD-10-CM

## 2018-02-15 DIAGNOSIS — N6489 Other specified disorders of breast: Secondary | ICD-10-CM | POA: Diagnosis not present

## 2018-03-06 ENCOUNTER — Ambulatory Visit: Payer: BLUE CROSS/BLUE SHIELD

## 2018-04-29 DIAGNOSIS — M25531 Pain in right wrist: Secondary | ICD-10-CM | POA: Diagnosis not present

## 2018-07-28 ENCOUNTER — Encounter (HOSPITAL_BASED_OUTPATIENT_CLINIC_OR_DEPARTMENT_OTHER): Payer: Self-pay | Admitting: *Deleted

## 2018-07-28 ENCOUNTER — Emergency Department (HOSPITAL_BASED_OUTPATIENT_CLINIC_OR_DEPARTMENT_OTHER): Payer: BC Managed Care – PPO

## 2018-07-28 ENCOUNTER — Other Ambulatory Visit: Payer: Self-pay

## 2018-07-28 ENCOUNTER — Emergency Department (HOSPITAL_BASED_OUTPATIENT_CLINIC_OR_DEPARTMENT_OTHER)
Admission: EM | Admit: 2018-07-28 | Discharge: 2018-07-28 | Disposition: A | Discharge status: Home / Self Care | Payer: BC Managed Care – PPO | Attending: Emergency Medicine | Admitting: Emergency Medicine

## 2018-07-28 DIAGNOSIS — N1330 Unspecified hydronephrosis: Secondary | ICD-10-CM | POA: Diagnosis not present

## 2018-07-28 DIAGNOSIS — F17210 Nicotine dependence, cigarettes, uncomplicated: Secondary | ICD-10-CM | POA: Insufficient documentation

## 2018-07-28 DIAGNOSIS — N3001 Acute cystitis with hematuria: Secondary | ICD-10-CM | POA: Diagnosis not present

## 2018-07-28 DIAGNOSIS — R319 Hematuria, unspecified: Secondary | ICD-10-CM | POA: Diagnosis not present

## 2018-07-28 DIAGNOSIS — N390 Urinary tract infection, site not specified: Secondary | ICD-10-CM

## 2018-07-28 DIAGNOSIS — M47814 Spondylosis without myelopathy or radiculopathy, thoracic region: Secondary | ICD-10-CM | POA: Diagnosis not present

## 2018-07-28 DIAGNOSIS — Z79899 Other long term (current) drug therapy: Secondary | ICD-10-CM | POA: Diagnosis not present

## 2018-07-28 DIAGNOSIS — R300 Dysuria: Secondary | ICD-10-CM | POA: Diagnosis not present

## 2018-07-28 DIAGNOSIS — M47816 Spondylosis without myelopathy or radiculopathy, lumbar region: Secondary | ICD-10-CM | POA: Diagnosis not present

## 2018-07-28 DIAGNOSIS — R1032 Left lower quadrant pain: Secondary | ICD-10-CM | POA: Diagnosis not present

## 2018-07-28 DIAGNOSIS — K760 Fatty (change of) liver, not elsewhere classified: Secondary | ICD-10-CM | POA: Diagnosis not present

## 2018-07-28 LAB — CBC WITH DIFFERENTIAL/PLATELET
Abs Immature Granulocytes: 0.1 10*3/uL — ABNORMAL HIGH (ref 0.00–0.07)
Basophils Absolute: 0.1 10*3/uL (ref 0.0–0.1)
Basophils Relative: 1 %
Eosinophils Absolute: 0.1 10*3/uL (ref 0.0–0.5)
Eosinophils Relative: 1 %
HCT: 42.8 % (ref 36.0–46.0)
Hemoglobin: 14.1 g/dL (ref 12.0–15.0)
Immature Granulocytes: 1 %
Lymphocytes Relative: 20 %
Lymphs Abs: 3.4 10*3/uL (ref 0.7–4.0)
MCH: 31.1 pg (ref 26.0–34.0)
MCHC: 32.9 g/dL (ref 30.0–36.0)
MCV: 94.3 fL (ref 80.0–100.0)
Monocytes Absolute: 1.2 10*3/uL — ABNORMAL HIGH (ref 0.1–1.0)
Monocytes Relative: 7 %
Neutro Abs: 11.8 10*3/uL — ABNORMAL HIGH (ref 1.7–7.7)
Neutrophils Relative %: 70 %
Platelets: 285 10*3/uL (ref 150–400)
RBC: 4.54 MIL/uL (ref 3.87–5.11)
RDW: 12.7 % (ref 11.5–15.5)
WBC: 16.6 10*3/uL — ABNORMAL HIGH (ref 4.0–10.5)
nRBC: 0 % (ref 0.0–0.2)

## 2018-07-28 LAB — COMPREHENSIVE METABOLIC PANEL
ALT: 44 U/L (ref 0–44)
AST: 40 U/L (ref 15–41)
Albumin: 4.4 g/dL (ref 3.5–5.0)
Alkaline Phosphatase: 63 U/L (ref 38–126)
Anion gap: 13 (ref 5–15)
BUN: 11 mg/dL (ref 6–20)
CO2: 19 mmol/L — ABNORMAL LOW (ref 22–32)
Calcium: 9 mg/dL (ref 8.9–10.3)
Chloride: 101 mmol/L (ref 98–111)
Creatinine, Ser: 0.56 mg/dL (ref 0.44–1.00)
GFR calc Af Amer: >60 mL/min (ref >60)
GFR calc non Af Amer: >60 mL/min (ref >60)
Glucose, Bld: 118 mg/dL — ABNORMAL HIGH (ref 70–99)
Potassium: 3.7 mmol/L (ref 3.5–5.1)
Sodium: 133 mmol/L — ABNORMAL LOW (ref 135–145)
Total Bilirubin: 0.4 mg/dL (ref 0.3–1.2)
Total Protein: 7.6 g/dL (ref 6.5–8.1)

## 2018-07-28 LAB — URINALYSIS, ROUTINE W REFLEX MICROSCOPIC
Bilirubin Urine: NEGATIVE
Glucose, UA: NEGATIVE mg/dL
Ketones, ur: NEGATIVE mg/dL
Nitrite: POSITIVE — AB
Protein, ur: 100 mg/dL — AB
Specific Gravity, Urine: 1.015 (ref 1.005–1.030)
pH: 6 (ref 5.0–8.0)

## 2018-07-28 LAB — URINALYSIS, MICROSCOPIC (REFLEX)
RBC / HPF: >50 RBC/hpf (ref 0–5)
WBC, UA: >50 WBC/hpf (ref 0–5)

## 2018-07-28 MED ORDER — MORPHINE SULFATE (PF) 4 MG/ML IV SOLN
INTRAVENOUS | Status: AC
  Filled 2018-07-28: qty 1

## 2018-07-28 MED ORDER — KETOROLAC TROMETHAMINE 30 MG/ML IJ SOLN
30.0000 mg | Freq: Once | INTRAMUSCULAR | Status: AC
  Administered 2018-07-28: 30 mg via INTRAVENOUS
  Filled 2018-07-28: qty 1

## 2018-07-28 MED ORDER — DIPHENHYDRAMINE HCL 25 MG PO CAPS
50.0000 mg | ORAL_CAPSULE | Freq: Once | ORAL | Status: AC
  Administered 2018-07-28: 50 mg via ORAL
  Filled 2018-07-28: qty 2

## 2018-07-28 MED ORDER — CEPHALEXIN 500 MG PO CAPS: 500.0000 mg | ORAL_CAPSULE | Freq: Four times a day (QID) | ORAL | 0 refills | Status: AC

## 2018-07-28 MED ORDER — OXYCODONE-ACETAMINOPHEN 5-325 MG PO TABS
2.0000 | ORAL_TABLET | Freq: Once | ORAL | Status: AC
  Administered 2018-07-28: 2 via ORAL
  Filled 2018-07-28: qty 2

## 2018-07-28 MED ORDER — SODIUM CHLORIDE 0.9 % IV SOLN
1.0000 g | Freq: Once | INTRAVENOUS | Status: AC
  Administered 2018-07-28: 1 g via INTRAVENOUS
  Filled 2018-07-28: qty 10

## 2018-07-28 MED ORDER — ONDANSETRON 4 MG PO TBDP: 4.0000 mg | ORAL_TABLET | Freq: Three times a day (TID) | ORAL | 0 refills | Status: DC | PRN

## 2018-07-28 MED ORDER — MORPHINE SULFATE (PF) 4 MG/ML IV SOLN
4.0000 mg | Freq: Once | INTRAVENOUS | Status: AC
  Administered 2018-07-28: 4 mg via INTRAVENOUS
  Filled 2018-07-28: qty 1

## 2018-07-28 MED ORDER — MORPHINE SULFATE (PF) 4 MG/ML IV SOLN
4.0000 mg | Freq: Once | INTRAVENOUS | Status: AC
  Administered 2018-07-28: 17:00:00 4 mg via INTRAVENOUS

## 2018-07-28 MED ORDER — OXYCODONE-ACETAMINOPHEN 5-325 MG PO TABS: 1.0000 | ORAL_TABLET | Freq: Once | ORAL | Status: DC

## 2018-07-28 MED ORDER — OXYCODONE-ACETAMINOPHEN 5-325 MG PO TABS: 1.0000 | ORAL_TABLET | ORAL | 0 refills | Status: AC | PRN

## 2018-07-28 MED ORDER — ONDANSETRON HCL 4 MG/2ML IJ SOLN
4.0000 mg | Freq: Once | INTRAMUSCULAR | Status: AC
  Administered 2018-07-28: 4 mg via INTRAVENOUS

## 2018-07-28 MED ORDER — ONDANSETRON HCL 4 MG/2ML IJ SOLN
INTRAMUSCULAR | Status: AC
  Filled 2018-07-28: qty 2

## 2018-07-28 NOTE — ED Triage Notes (Signed)
Difficulty urinating for 2 days.  Sometimes when she urinates, it stops midstream.  Pain to LLQ radiating to bilateral lower back.

## 2018-07-28 NOTE — ED Notes (Signed)
ED Provider at bedside. 

## 2018-07-28 NOTE — ED Provider Notes (Signed)
[place] [facility] EMERGENCY DEPARTMENT Provider Note   CSN: [id number] Arrival date & time: 07/28/18  1614    History   Chief Complaint Chief Complaint  Patient presents with  . Dysuria    HPI Norma [name] is a 58 y.o. female.     58yo F w/ PMH below who p/w urinary problems and back pain. 2 days ago, she began having difficulty urinating w/ dysuria and urinary frequency. Recently she has noticed cloudy urine. She has had progressively worsening pain in LLQ and stabbing pain in her back, mostly left side. She feels like she is not able to empty her bladder. She reports nausea, no vomiting, diarrhea, cough, or fever. No h/o kidney stones.  The history is provided by the patient.    Past Medical History:  Diagnosis Date  . Anxiety   . Depression   . PONV (postoperative nausea and vomiting)   . Snores   . Volar retinacular ganglion     Patient Active Problem List   Diagnosis Date Noted  . Radiculopathy 04/10/2013    Past Surgical History:  Procedure Laterality Date  . ABDOMINAL HYSTERECTOMY    . ANTERIOR CERVICAL DECOMP/DISCECTOMY FUSION N/A 04/10/2013   Procedure: ANTERIOR CERVICAL DECOMPRESSION/DISCECTOMY FUSION 1 LEVEL;  Surgeon: Sinclair Ship, MD;  Location: [place] [facility];  Service: Orthopedics;  Laterality: N/A;  Anterior cervical decompression fusion, cervical 6-7 with instrumentation and allograft  . APPENDECTOMY    . BARTHOLIN CYST MARSUPIALIZATION N/A 03/07/2013   Procedure: BARTHOLIN CYST MARSUPIALIZATION;  Surgeon: Annalee Genta, DO;  Location: [facility];  Service: Gynecology;  Laterality: N/A;  . COLONOSCOPY    . COLONOSCOPY WITH PROPOFOL N/A 06/15/2016   Procedure: COLONOSCOPY WITH PROPOFOL;  Surgeon: Garlan Fair, MD;  Location: [facility];  Service: Endoscopy;  Laterality: N/A;  . INCISION AND DRAINAGE HIP     RT  . REDUCTION MAMMAPLASTY Bilateral 1978  . SHOULDER ARTHROSCOPY     RT  . WRIST GANGLION EXCISION     X2 RT WRIST, X1 LT WRIST     OB History    Gravida  2   Para  2   Term      Preterm      AB      Living        SAB      TAB      Ectopic      Multiple      Live Births               Home Medications    Prior to Admission medications   Medication Sig Start Date End Date Taking? Authorizing Provider  buPROPion (WELLBUTRIN SR) 150 MG 12 hr tablet Take 150 mg by mouth every evening.   Yes [provider]  citalopram (CELEXA) 40 MG tablet Take 40 mg by mouth every evening.   Yes [provider]  clonazePAM (KLONOPIN) 1 MG tablet Take 1 mg by mouth at bedtime.    Yes [provider]  estradiol (ESTRACE) 1 MG tablet Take 1 mg by mouth every evening.    Yes [provider]  fenofibrate 160 MG tablet Take 160 mg by mouth daily.   Yes [provider]  Omega-3 Fatty Acids (FISH OIL PO) Take 2 capsules by mouth every evening.   Yes [provider]  cephALEXin (KEFLEX) 500 MG capsule Take 1 capsule (500 mg total) by mouth 4 (four) times daily for 10 days. 07/28/18 08/07/18  Emmilia Sowder, Apolonio Schneiders  Lequita HaltMorgan, MD  ibuprofen (ADVIL,MOTRIN) 800 MG tablet Take 1 tablet (800 mg total) by mouth 3 (three) times daily. Patient taking differently: Take 800 mg by mouth daily as needed for moderate pain.  04/15/16   Long, Arlyss RepressJoshua G, MD  ondansetron (ZOFRAN ODT) 4 MG disintegrating tablet Take 1 tablet (4 mg total) by mouth every 8 (eight) hours as needed for nausea or vomiting. 07/28/18   Hideko Esselman, Ambrose Finlandachel Morgan, MD  oxyCODONE-acetaminophen (PERCOCET) 5-325 MG tablet Take 1 tablet by mouth every 4 (four) hours as needed for up to 5 days for moderate pain or severe pain. 07/28/18 08/02/18  Nikolina Simerson, Ambrose Finlandachel Morgan, MD    Family History Family History  Problem Relation Age of Onset  . Cancer Mother   . Cancer Father   . Breast cancer Neg Hx     Social History Social History   Tobacco Use  . Smoking status: Current Every Day Smoker    Packs/day: 0.25    Types: Cigarettes  . Smokeless  tobacco: Never Used  Substance Use Topics  . Alcohol use: Yes    Comment: SOCIAL  . Drug use: No     Allergies   Codeine and Other   Review of Systems Review of Systems All other systems reviewed and are negative except that which was mentioned in HPI   Physical Exam Updated Vital Signs BP (!) 158/94 (BP Location: Right Arm)   Pulse 99   Temp 98.5 F (36.9 C) (Oral)   Resp 20   Ht 5' 5.5" (1.664 m)   Wt 95.3 kg   SpO2 97%   BMI 34.41 kg/m   Physical Exam Vitals signs and nursing note reviewed.  Constitutional:      Appearance: She is well-developed.     Comments: In distress due to pain  HENT:     Head: Normocephalic and atraumatic.  Eyes:     Conjunctiva/sclera: Conjunctivae normal.     Pupils: Pupils are equal, round, and reactive to light.  Neck:     Musculoskeletal: Neck supple.  Cardiovascular:     Rate and Rhythm: Normal rate and regular rhythm.     Heart sounds: Normal heart sounds. No murmur.  Pulmonary:     Effort: Pulmonary effort is normal.     Breath sounds: Normal breath sounds.  Abdominal:     General: Bowel sounds are normal. There is no distension.     Palpations: Abdomen is soft.     Tenderness: There is abdominal tenderness in the suprapubic area and left lower quadrant. There is guarding (voluntary). There is no rebound.  Skin:    General: Skin is warm and dry.  Neurological:     Mental Status: She is alert and oriented to person, place, and time.     Comments: Fluent speech  Psychiatric:        Judgment: Judgment normal.      ED Treatments / Results  Labs (all labs ordered are listed, but only abnormal results are displayed) Labs Reviewed  URINALYSIS, ROUTINE W REFLEX MICROSCOPIC - Abnormal; Notable for the following components:      Result Value   APPearance CLOUDY (*)    Hgb urine dipstick LARGE (*)    Protein, ur 100 (*)    Nitrite POSITIVE (*)    Leukocytes,Ua MODERATE (*)    All other components within normal limits   URINALYSIS, MICROSCOPIC (REFLEX) - Abnormal; Notable for the following components:   Bacteria, UA MANY (*)    All other components  within normal limits  COMPREHENSIVE METABOLIC PANEL - Abnormal; Notable for the following components:   Sodium 133 (*)    CO2 19 (*)    Glucose, Bld 118 (*)    All other components within normal limits  CBC WITH DIFFERENTIAL/PLATELET - Abnormal; Notable for the following components:   WBC 16.6 (*)    Neutro Abs 11.8 (*)    Monocytes Absolute 1.2 (*)    Abs Immature Granulocytes 0.10 (*)    All other components within normal limits  URINE CULTURE    EKG None  Radiology Ct Renal Stone Study  Result Date: 07/28/2018 CLINICAL DATA:  Patient with left flank pain. Evaluate for renal stones. EXAM: CT ABDOMEN AND PELVIS WITHOUT CONTRAST TECHNIQUE: Multidetector CT imaging of the abdomen and pelvis was performed following the standard protocol without IV contrast. COMPARISON:  CT abdomen pelvis 10/21/2015 FINDINGS: Lower chest: Normal heart size. Bibasilar atelectasis. No pleural effusion. Hepatobiliary: The liver is diffusely low in attenuation compatible with steatosis. Gallbladder is unremarkable. No intrahepatic or extrahepatic biliary ductal dilatation. Pancreas: Unremarkable Spleen: Unremarkable Adrenals/Urinary Tract: Normal adrenal glands. Mild left hydronephrosis. Fat stranding along the course of the left ureter. Urinary bladder is unremarkable. No nephroureterolithiasis. No right-sided hydronephrosis. Stomach/Bowel: Sigmoid colonic diverticulosis. No CT evidence for acute diverticulitis. No abnormal bowel wall thickening or evidence for bowel obstruction. No free fluid or free intraperitoneal air. Normal morphology of the stomach. Vascular/Lymphatic: Normal caliber abdominal aorta. Peripheral calcified atherosclerotic plaque. No retroperitoneal lymphadenopathy. Reproductive: Status post hysterectomy. Other: None. Musculoskeletal: Lumbar spine and lower thoracic  spine degenerative changes. No aggressive or acute appearing osseous lesions. IMPRESSION: Mild left hydronephrosis with fat stranding along the course of left ureter. No left-sided nephroureterolithiasis is identified. Findings are nonspecific and may be secondary to recently passed stone or potentially ascending urinary tract infection. Hepatic steatosis. Electronically Signed   By: Annia Beltrew  Davis M.D.   On: 07/28/2018 18:22    Procedures Procedures (including critical care time)  Medications Ordered in ED Medications  diphenhydrAMINE (BENADRYL) capsule 50 mg (has no administration in time range)  morphine 4 MG/ML injection 4 mg (4 mg Intravenous Given 07/28/18 1703)  ondansetron (ZOFRAN) injection 4 mg (4 mg Intravenous Given 07/28/18 1702)  morphine 4 MG/ML injection 4 mg (4 mg Intravenous Given 07/28/18 1723)  cefTRIAXone (ROCEPHIN) 1 g in sodium chloride 0.9 % 100 mL IVPB (0 g Intravenous Stopped 07/28/18 1958)  ketorolac (TORADOL) 30 MG/ML injection 30 mg (30 mg Intravenous Given 07/28/18 1853)  oxyCODONE-acetaminophen (PERCOCET/ROXICET) 5-325 MG per tablet 2 tablet (2 tablets Oral Given 07/28/18 1851)     Initial Impression / Assessment and Plan / ED Course  I have reviewed the triage vital signs and the nursing notes.  Pertinent labs & imaging results that were available during my care of the patient were reviewed by me and considered in my medical decision making (see chart for details).       In distress due to pain, suprapubic and LLQ tenderness. UA c/w infection. DDx includes pyelonephritis or infected kidney stone, therefore obtained CT renal study.   Cr normal, WBC 16.6.  CT shows mild left hydronephrosis and stranding but no evidence of stone.  She may have recently passed stone or is developing early pyelonephritis.  Gave Toradol and ceftriaxone.  Patient was comfortable on reassessment.  I have discussed treatment with antibiotics, pain control, and nausea medication as needed.   Extensively reviewed return precautions including any worsening symptoms.  She voiced understanding.  Final Clinical  Impressions(s) / ED Diagnoses   Final diagnoses:  Urinary tract infection with hematuria, site unspecified    ED Discharge Orders         Ordered    cephALEXin (KEFLEX) 500 MG capsule  4 times daily     07/28/18 2009    oxyCODONE-acetaminophen (PERCOCET) 5-325 MG tablet  Every 4 hours PRN     07/28/18 2009    ondansetron (ZOFRAN ODT) 4 MG disintegrating tablet  Every 8 hours PRN     07/28/18 2009           Alizae Bechtel, Ambrose Finlandachel Morgan, MD 07/28/18 2011

## 2018-07-31 LAB — URINE CULTURE: Culture: >=100000 — AB

## 2018-08-01 ENCOUNTER — Telehealth: Payer: Self-pay | Admitting: Emergency Medicine

## 2018-08-01 NOTE — Telephone Encounter (Signed)
Post ED Visit - Positive Culture Follow-up  Culture report reviewed by antimicrobial stewardship pharmacist: [facility] Team []  Elenor Quinones, Pharm.D. []  Heide Guile, Pharm.D., BCPS AQ-ID []  Parks Neptune, Pharm.D., BCPS []  Alycia Rossetti, Pharm.D., BCPS []  [place], Pharm.D., BCPS, AAHIVP []  Legrand Como, Pharm.D., BCPS, AAHIVP [x]  Salome Arnt, PharmD, BCPS []  Johnnette Gourd, PharmD, BCPS []  Hughes Better, PharmD, BCPS []  Leeroy Cha, PharmD []  Laqueta Linden, PharmD, BCPS []  Albertina Parr, PharmD  [facility] Team []  Leodis Sias, PharmD []  Lindell Spar, PharmD []  Royetta Asal, PharmD []  Graylin Shiver, Rph []  Rema Fendt) Glennon Mac, PharmD []  Arlyn Dunning, PharmD []  Netta Cedars, PharmD []  Dia Sitter, PharmD []  Leone Haven, PharmD []  Gretta Arab, PharmD []  Theodis Shove, PharmD []  Peggyann Juba, PharmD []  Reuel Boom, PharmD   Positive urine culture Treated with cephalexin, organism sensitive to the same and no further patient follow-up is required at this time.  Hazle Nordmann 08/01/2018, 11:50 AM

## 2018-08-22 DIAGNOSIS — M7061 Trochanteric bursitis, right hip: Secondary | ICD-10-CM | POA: Diagnosis not present

## 2018-08-22 DIAGNOSIS — M67911 Unspecified disorder of synovium and tendon, right shoulder: Secondary | ICD-10-CM | POA: Diagnosis not present

## 2018-08-22 DIAGNOSIS — M67912 Unspecified disorder of synovium and tendon, left shoulder: Secondary | ICD-10-CM | POA: Diagnosis not present

## 2018-08-24 ENCOUNTER — Other Ambulatory Visit: Payer: Self-pay

## 2018-08-24 ENCOUNTER — Emergency Department (HOSPITAL_BASED_OUTPATIENT_CLINIC_OR_DEPARTMENT_OTHER): Payer: BC Managed Care – PPO

## 2018-08-24 ENCOUNTER — Encounter (HOSPITAL_BASED_OUTPATIENT_CLINIC_OR_DEPARTMENT_OTHER): Payer: Self-pay

## 2018-08-24 ENCOUNTER — Emergency Department (HOSPITAL_BASED_OUTPATIENT_CLINIC_OR_DEPARTMENT_OTHER)
Admission: EM | Admit: 2018-08-24 | Discharge: 2018-08-24 | Disposition: A | Discharge status: Home / Self Care | Payer: BC Managed Care – PPO | Attending: Emergency Medicine | Admitting: Emergency Medicine

## 2018-08-24 DIAGNOSIS — Y9289 Other specified places as the place of occurrence of the external cause: Secondary | ICD-10-CM | POA: Diagnosis not present

## 2018-08-24 DIAGNOSIS — S0101XA Laceration without foreign body of scalp, initial encounter: Secondary | ICD-10-CM | POA: Insufficient documentation

## 2018-08-24 DIAGNOSIS — F17210 Nicotine dependence, cigarettes, uncomplicated: Secondary | ICD-10-CM | POA: Insufficient documentation

## 2018-08-24 DIAGNOSIS — M545 Low back pain: Secondary | ICD-10-CM | POA: Diagnosis not present

## 2018-08-24 DIAGNOSIS — M25572 Pain in left ankle and joints of left foot: Secondary | ICD-10-CM | POA: Diagnosis not present

## 2018-08-24 DIAGNOSIS — S82832A Other fracture of upper and lower end of left fibula, initial encounter for closed fracture: Secondary | ICD-10-CM | POA: Diagnosis not present

## 2018-08-24 DIAGNOSIS — Y9389 Activity, other specified: Secondary | ICD-10-CM | POA: Diagnosis not present

## 2018-08-24 DIAGNOSIS — S8992XA Unspecified injury of left lower leg, initial encounter: Secondary | ICD-10-CM | POA: Diagnosis not present

## 2018-08-24 DIAGNOSIS — Z23 Encounter for immunization: Secondary | ICD-10-CM | POA: Diagnosis not present

## 2018-08-24 DIAGNOSIS — R110 Nausea: Secondary | ICD-10-CM | POA: Insufficient documentation

## 2018-08-24 DIAGNOSIS — S99912A Unspecified injury of left ankle, initial encounter: Secondary | ICD-10-CM | POA: Diagnosis not present

## 2018-08-24 DIAGNOSIS — M79662 Pain in left lower leg: Secondary | ICD-10-CM | POA: Diagnosis not present

## 2018-08-24 DIAGNOSIS — S0181XA Laceration without foreign body of other part of head, initial encounter: Secondary | ICD-10-CM | POA: Diagnosis not present

## 2018-08-24 DIAGNOSIS — S0990XA Unspecified injury of head, initial encounter: Secondary | ICD-10-CM | POA: Diagnosis not present

## 2018-08-24 DIAGNOSIS — S199XXA Unspecified injury of neck, initial encounter: Secondary | ICD-10-CM | POA: Diagnosis not present

## 2018-08-24 DIAGNOSIS — Y990 Civilian activity done for income or pay: Secondary | ICD-10-CM | POA: Diagnosis not present

## 2018-08-24 MED ORDER — CYCLOBENZAPRINE HCL 10 MG PO TABS: 10.0000 mg | ORAL_TABLET | Freq: Two times a day (BID) | ORAL | 0 refills | Status: DC | PRN

## 2018-08-24 MED ORDER — CYCLOBENZAPRINE HCL 5 MG PO TABS
5.0000 mg | ORAL_TABLET | Freq: Once | ORAL | Status: AC
  Administered 2018-08-24: 20:00:00 5 mg via ORAL
  Filled 2018-08-24: qty 1

## 2018-08-24 MED ORDER — TETANUS-DIPHTH-ACELL PERTUSSIS 5-2.5-18.5 LF-MCG/0.5 IM SUSP
0.5000 mL | Freq: Once | INTRAMUSCULAR | Status: AC
  Administered 2018-08-24: 0.5 mL via INTRAMUSCULAR
  Filled 2018-08-24: qty 0.5

## 2018-08-24 MED ORDER — ONDANSETRON 4 MG PO TBDP
4.0000 mg | ORAL_TABLET | Freq: Once | ORAL | Status: AC
  Administered 2018-08-24: 4 mg via ORAL
  Filled 2018-08-24: qty 1

## 2018-08-24 MED ORDER — LIDOCAINE-EPINEPHRINE-TETRACAINE (LET) SOLUTION
3.0000 mL | Freq: Once | NASAL | Status: AC
  Administered 2018-08-24: 3 mL via TOPICAL
  Filled 2018-08-24: qty 3

## 2018-08-24 MED ORDER — DIPHENHYDRAMINE HCL 25 MG PO CAPS
25.0000 mg | ORAL_CAPSULE | Freq: Once | ORAL | Status: AC
  Administered 2018-08-24: 25 mg via ORAL
  Filled 2018-08-24: qty 1

## 2018-08-24 MED ORDER — MORPHINE SULFATE (PF) 4 MG/ML IV SOLN
6.0000 mg | Freq: Once | INTRAVENOUS | Status: AC
  Administered 2018-08-24: 16:00:00 6 mg via INTRAMUSCULAR
  Filled 2018-08-24: qty 2

## 2018-08-24 MED ORDER — KETOROLAC TROMETHAMINE 60 MG/2ML IM SOLN
30.0000 mg | Freq: Once | INTRAMUSCULAR | Status: AC
  Administered 2018-08-24: 30 mg via INTRAMUSCULAR
  Filled 2018-08-24: qty 2
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
## 2018-08-24 NOTE — ED Notes (Signed)
Family at bedside. 

## 2018-08-24 NOTE — ED Notes (Signed)
ED Provider at bedside. 

## 2018-08-24 NOTE — ED Notes (Signed)
Pt. Has called out multiple times for pain meds RN going in to see Pt. At this time.
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
## 2018-08-24 NOTE — ED Notes (Signed)
Cleaned Pt. Wound and placed LET on Pt. Wound on head.  Pt. Tolerated well.  Pt. Aware of what is going to be done.
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
## 2018-08-24 NOTE — ED Notes (Signed)
In to Pt. Room with Pt. C/o feeling nauseated.  Pt. HOB elevated slightly more than it already was to prevent aspiration if she vomited.  Pt. Given an emesis bag.  Pt. Husband at bedside.  Pt. Yelled she was hurting and needed pain meds.  RN explained the Dr. Aletha Halim be in to see her and we will give pain meds from the Dr. Kayleen Memos.

## 2018-08-30 DIAGNOSIS — H04123 Dry eye syndrome of bilateral lacrimal glands: Secondary | ICD-10-CM | POA: Diagnosis not present

## 2018-09-10 DIAGNOSIS — M25562 Pain in left knee: Secondary | ICD-10-CM | POA: Diagnosis not present

## 2018-09-21 DIAGNOSIS — E669 Obesity, unspecified: Secondary | ICD-10-CM | POA: Diagnosis not present

## 2018-09-21 DIAGNOSIS — Z79890 Hormone replacement therapy (postmenopausal): Secondary | ICD-10-CM | POA: Diagnosis not present

## 2018-09-21 DIAGNOSIS — N816 Rectocele: Secondary | ICD-10-CM | POA: Diagnosis not present

## 2018-09-21 DIAGNOSIS — Z01411 Encounter for gynecological examination (general) (routine) with abnormal findings: Secondary | ICD-10-CM | POA: Diagnosis not present

## 2018-09-21 DIAGNOSIS — R3989 Other symptoms and signs involving the genitourinary system: Secondary | ICD-10-CM | POA: Diagnosis not present

## 2018-10-11 DIAGNOSIS — E785 Hyperlipidemia, unspecified: Secondary | ICD-10-CM | POA: Diagnosis not present

## 2018-10-11 DIAGNOSIS — Z86010 Personal history of colonic polyps: Secondary | ICD-10-CM | POA: Diagnosis not present

## 2018-10-11 DIAGNOSIS — Z720 Tobacco use: Secondary | ICD-10-CM | POA: Diagnosis not present

## 2018-10-11 DIAGNOSIS — L218 Other seborrheic dermatitis: Secondary | ICD-10-CM | POA: Diagnosis not present

## 2018-10-11 DIAGNOSIS — M545 Low back pain: Secondary | ICD-10-CM | POA: Diagnosis not present

## 2018-10-11 DIAGNOSIS — Z23 Encounter for immunization: Secondary | ICD-10-CM | POA: Diagnosis not present

## 2018-10-11 DIAGNOSIS — Z0000 Encounter for general adult medical examination without abnormal findings: Secondary | ICD-10-CM | POA: Diagnosis not present

## 2018-10-11 DIAGNOSIS — F322 Major depressive disorder, single episode, severe without psychotic features: Secondary | ICD-10-CM | POA: Diagnosis not present

## 2018-10-11 DIAGNOSIS — R918 Other nonspecific abnormal finding of lung field: Secondary | ICD-10-CM | POA: Diagnosis not present

## 2018-10-11 DIAGNOSIS — I10 Essential (primary) hypertension: Secondary | ICD-10-CM | POA: Diagnosis not present

## 2018-10-11 DIAGNOSIS — E559 Vitamin D deficiency, unspecified: Secondary | ICD-10-CM | POA: Diagnosis not present

## 2018-10-19 ENCOUNTER — Other Ambulatory Visit: Payer: Self-pay | Admitting: Orthopedic Surgery

## 2018-10-19 DIAGNOSIS — M67912 Unspecified disorder of synovium and tendon, left shoulder: Secondary | ICD-10-CM

## 2018-10-26 ENCOUNTER — Ambulatory Visit
Admission: RE | Admit: 2018-10-26 | Discharge: 2018-10-26 | Disposition: A | Discharge status: Home / Self Care | Payer: BC Managed Care – PPO | Source: Ambulatory Visit | Attending: Orthopedic Surgery | Admitting: Orthopedic Surgery

## 2018-10-26 DIAGNOSIS — M67912 Unspecified disorder of synovium and tendon, left shoulder: Secondary | ICD-10-CM

## 2018-11-01 ENCOUNTER — Ambulatory Visit: Payer: BC Managed Care – PPO | Admitting: Dietician

## 2018-11-12 ENCOUNTER — Other Ambulatory Visit: Payer: BC Managed Care – PPO

## 2019-01-30 DIAGNOSIS — R0683 Snoring: Secondary | ICD-10-CM | POA: Diagnosis not present

## 2019-01-30 DIAGNOSIS — G4719 Other hypersomnia: Secondary | ICD-10-CM | POA: Diagnosis not present

## 2019-01-30 DIAGNOSIS — R0681 Apnea, not elsewhere classified: Secondary | ICD-10-CM | POA: Diagnosis not present

## 2019-01-30 DIAGNOSIS — R300 Dysuria: Secondary | ICD-10-CM | POA: Diagnosis not present

## 2019-01-30 DIAGNOSIS — R5383 Other fatigue: Secondary | ICD-10-CM | POA: Diagnosis not present

## 2019-02-07 ENCOUNTER — Other Ambulatory Visit (HOSPITAL_BASED_OUTPATIENT_CLINIC_OR_DEPARTMENT_OTHER): Payer: Self-pay

## 2019-02-07 DIAGNOSIS — R5383 Other fatigue: Secondary | ICD-10-CM

## 2019-02-07 DIAGNOSIS — G4700 Insomnia, unspecified: Secondary | ICD-10-CM

## 2019-02-07 DIAGNOSIS — G4710 Hypersomnia, unspecified: Secondary | ICD-10-CM

## 2019-02-07 DIAGNOSIS — G4730 Sleep apnea, unspecified: Secondary | ICD-10-CM

## 2019-02-07 DIAGNOSIS — R0683 Snoring: Secondary | ICD-10-CM

## 2019-02-25 ENCOUNTER — Other Ambulatory Visit (HOSPITAL_COMMUNITY): Admission: RE | Admit: 2019-02-25 | Payer: BC Managed Care – PPO | Source: Ambulatory Visit

## 2019-02-27 ENCOUNTER — Encounter (HOSPITAL_BASED_OUTPATIENT_CLINIC_OR_DEPARTMENT_OTHER): Payer: BC Managed Care – PPO | Admitting: Internal Medicine

## 2019-03-26 DIAGNOSIS — F329 Major depressive disorder, single episode, unspecified: Secondary | ICD-10-CM | POA: Diagnosis not present

## 2019-03-26 DIAGNOSIS — F410 Panic disorder [episodic paroxysmal anxiety] without agoraphobia: Secondary | ICD-10-CM | POA: Diagnosis not present

## 2019-03-26 DIAGNOSIS — E559 Vitamin D deficiency, unspecified: Secondary | ICD-10-CM | POA: Diagnosis not present

## 2019-03-26 DIAGNOSIS — E669 Obesity, unspecified: Secondary | ICD-10-CM | POA: Diagnosis not present

## 2019-04-02 ENCOUNTER — Other Ambulatory Visit: Payer: Self-pay | Admitting: Internal Medicine

## 2019-04-02 DIAGNOSIS — Z1231 Encounter for screening mammogram for malignant neoplasm of breast: Secondary | ICD-10-CM

## 2019-04-09 DIAGNOSIS — L918 Other hypertrophic disorders of the skin: Secondary | ICD-10-CM | POA: Diagnosis not present

## 2019-04-09 DIAGNOSIS — L309 Dermatitis, unspecified: Secondary | ICD-10-CM | POA: Diagnosis not present

## 2019-04-09 DIAGNOSIS — L218 Other seborrheic dermatitis: Secondary | ICD-10-CM | POA: Diagnosis not present

## 2019-04-15 ENCOUNTER — Inpatient Hospital Stay (HOSPITAL_COMMUNITY): Admission: RE | Admit: 2019-04-15 | Payer: Self-pay | Source: Ambulatory Visit

## 2019-04-15 DIAGNOSIS — F339 Major depressive disorder, recurrent, unspecified: Secondary | ICD-10-CM | POA: Diagnosis not present

## 2019-04-15 DIAGNOSIS — F411 Generalized anxiety disorder: Secondary | ICD-10-CM | POA: Diagnosis not present

## 2019-04-17 ENCOUNTER — Encounter (HOSPITAL_BASED_OUTPATIENT_CLINIC_OR_DEPARTMENT_OTHER): Payer: Self-pay | Admitting: Internal Medicine

## 2019-04-30 DIAGNOSIS — E669 Obesity, unspecified: Secondary | ICD-10-CM | POA: Diagnosis not present

## 2019-04-30 DIAGNOSIS — E782 Mixed hyperlipidemia: Secondary | ICD-10-CM | POA: Diagnosis not present

## 2019-04-30 DIAGNOSIS — E785 Hyperlipidemia, unspecified: Secondary | ICD-10-CM | POA: Diagnosis not present

## 2019-04-30 DIAGNOSIS — Z6835 Body mass index (BMI) 35.0-35.9, adult: Secondary | ICD-10-CM | POA: Diagnosis not present

## 2019-05-09 DIAGNOSIS — M25561 Pain in right knee: Secondary | ICD-10-CM | POA: Diagnosis not present

## 2019-05-10 ENCOUNTER — Ambulatory Visit
Admission: EM | Admit: 2019-05-10 | Discharge: 2019-05-10 | Disposition: A | Discharge status: Home / Self Care | Payer: BC Managed Care – PPO | Attending: Physician Assistant | Admitting: Physician Assistant

## 2019-05-10 DIAGNOSIS — H6981 Other specified disorders of Eustachian tube, right ear: Secondary | ICD-10-CM

## 2019-05-10 DIAGNOSIS — J3489 Other specified disorders of nose and nasal sinuses: Secondary | ICD-10-CM

## 2019-05-10 MED ORDER — AZELASTINE HCL 0.1 % NA SOLN: 2.0000 | Freq: Two times a day (BID) | NASAL | 0 refills | Status: DC

## 2019-05-10 MED ORDER — FLUTICASONE PROPIONATE 50 MCG/ACT NA SUSP: 2.0000 | Freq: Every day | NASAL | 0 refills | Status: DC

## 2019-05-10 MED ORDER — PREDNISONE 50 MG PO TABS: 50.0000 mg | ORAL_TABLET | Freq: Every day | ORAL | 0 refills | Status: DC

## 2019-05-10 NOTE — ED Triage Notes (Signed)
Pt c/o rt ear pain, rt side head pain and rt side mouth pain since Monday.

## 2019-05-10 NOTE — ED Provider Notes (Signed)
[facility]    CSN: [id number] Arrival date & time: 05/10/19  1009      History   Chief Complaint Chief Complaint  Patient presents with  . Otalgia    HPI Norma [name] is a 59 y.o. female.   59 year old female comes in for 5 day history of right ear pain. States for the past 1-2 weeks has also had intermittent right sided headache. Now pain is more constant, and can feel pain radiating to the right mouth. Denies photophobia, phonophobia, vision changes.  Denies dizziness, nasal congestion, cough.  Denies dental pain, oral swelling.  Denies fever, chills, body aches.     Past Medical History:  Diagnosis Date  . Anxiety   . Depression   . PONV (postoperative nausea and vomiting)   . Snores   . Volar retinacular ganglion     Patient Active Problem List   Diagnosis Date Noted  . Radiculopathy 04/10/2013    Past Surgical History:  Procedure Laterality Date  . ABDOMINAL HYSTERECTOMY    . ANTERIOR CERVICAL DECOMP/DISCECTOMY FUSION N/A 04/10/2013   Procedure: ANTERIOR CERVICAL DECOMPRESSION/DISCECTOMY FUSION 1 LEVEL;  Surgeon: Emilee Hero, MD;  Location: [facility];  Service: Orthopedics;  Laterality: N/A;  Anterior cervical decompression fusion, cervical 6-7 with instrumentation and allograft  . APPENDECTOMY    . BARTHOLIN CYST MARSUPIALIZATION N/A 03/07/2013   Procedure: BARTHOLIN CYST MARSUPIALIZATION;  Surgeon: Sharon Seller, DO;  Location: [facility];  Service: Gynecology;  Laterality: N/A;  . COLONOSCOPY    . COLONOSCOPY WITH PROPOFOL N/A 06/15/2016   Procedure: COLONOSCOPY WITH PROPOFOL;  Surgeon: Charolett Bumpers, MD;  Location: [facility];  Service: Endoscopy;  Laterality: N/A;  . INCISION AND DRAINAGE HIP     RT  . REDUCTION MAMMAPLASTY Bilateral 1978  . SHOULDER ARTHROSCOPY     RT  . WRIST GANGLION EXCISION     X2 RT WRIST, X1 LT WRIST    OB History    Gravida  2   Para  2   Term      Preterm      AB      Living        SAB     TAB      Ectopic      Multiple      Live Births               Home Medications    Prior to Admission medications   Medication Sig Start Date End Date Taking? Authorizing Provider  azelastine (ASTELIN) 0.1 % nasal spray Place 2 sprays into both nostrils 2 (two) times daily. 05/10/19   Belinda Fisher, PA-C  buPROPion (WELLBUTRIN SR) 150 MG 12 hr tablet Take 150 mg by mouth every evening.    [provider]  citalopram (CELEXA) 40 MG tablet Take 40 mg by mouth every evening.    [provider]  clonazePAM (KLONOPIN) 1 MG tablet Take 1 mg by mouth at bedtime.     [provider]  cyclobenzaprine (FLEXERIL) 10 MG tablet Take 1 tablet (10 mg total) by mouth 2 (two) times daily as needed for muscle spasms. 08/24/18   Albrizze, Kaitlyn E, PA-C  estradiol (ESTRACE) 1 MG tablet Take 1 mg by mouth every evening.     [provider]  fenofibrate 160 MG tablet Take 160 mg by mouth daily.    [provider]  fluticasone (FLONASE) 50 MCG/ACT nasal spray Place 2 sprays into both  nostrils daily. 05/10/19   Cathie Hoops, Kyna Blahnik V, PA-C  ibuprofen (ADVIL,MOTRIN) 800 MG tablet Take 1 tablet (800 mg total) by mouth 3 (three) times daily. Patient taking differently: Take 800 mg by mouth daily as needed for moderate pain.  04/15/16   Long, Arlyss Repress, MD  Omega-3 Fatty Acids (FISH OIL PO) Take 2 capsules by mouth every evening.    [provider]  ondansetron (ZOFRAN ODT) 4 MG disintegrating tablet Take 1 tablet (4 mg total) by mouth every 8 (eight) hours as needed for nausea or vomiting. 07/28/18   Little, Ambrose Finland, MD  predniSONE (DELTASONE) 50 MG tablet Take 1 tablet (50 mg total) by mouth daily with breakfast. 05/10/19   Belinda Fisher, PA-C    Family History Family History  Problem Relation Age of Onset  . Cancer Mother   . Cancer Father   . Breast cancer Neg Hx     Social History Social History   Tobacco Use  . Smoking status: Current Every Day Smoker     Packs/day: 0.25    Types: Cigarettes  . Smokeless tobacco: Never Used  Substance Use Topics  . Alcohol use: Yes    Comment: SOCIAL  . Drug use: No     Allergies   Codeine and Other   Review of Systems Review of Systems  Reason unable to perform ROS: See HPI as above.     Physical Exam Triage Vital Signs ED Triage Vitals  Enc Vitals Group     BP 05/10/19 1019 (!) 156/85     Pulse Rate 05/10/19 1019 97     Resp 05/10/19 1019 (!) 22     Temp 05/10/19 1019 (!) 97.5 F (36.4 C)     Temp Source 05/10/19 1019 Oral     SpO2 05/10/19 1019 95 %     Weight --      Height --      Head Circumference --      Peak Flow --      Pain Score 05/10/19 1040 3     Pain Loc --      Pain Edu? --      Excl. in GC? --    No data found.  Updated Vital Signs BP (!) 156/85 (BP Location: Left Arm)   Pulse 97   Temp (!) 97.5 F (36.4 C) (Oral)   Resp (!) 22   SpO2 95%   Physical Exam Constitutional:      General: She is not in acute distress.    Appearance: Normal appearance. She is not ill-appearing, toxic-appearing or diaphoretic.  HENT:     Head: Normocephalic and atraumatic.     Right Ear: Ear canal and external ear normal. Tympanic membrane is not erythematous or bulging.     Left Ear: Tympanic membrane, ear canal and external ear normal. Tympanic membrane is not erythematous or bulging.     Ears:     Comments: Right TM opaque with mid ear effusion.     Nose:     Right Sinus: Maxillary sinus tenderness and frontal sinus tenderness present.     Left Sinus: No maxillary sinus tenderness or frontal sinus tenderness.     Mouth/Throat:     Mouth: Mucous membranes are moist.     Pharynx: Oropharynx is clear. Uvula midline.     Comments: No dental pain on palpation.  Eyes:     Extraocular Movements: Extraocular movements intact.     Conjunctiva/sclera: Conjunctivae normal.  Pupils: Pupils are equal, round, and reactive to light.  Neck:     Comments: Right neck tenderness. No  spinous processes tenderness.  Cardiovascular:     Rate and Rhythm: Normal rate and regular rhythm.     Heart sounds: Normal heart sounds. No murmur. No friction rub. No gallop.   Pulmonary:     Effort: Pulmonary effort is normal. No accessory muscle usage, prolonged expiration, respiratory distress or retractions.     Comments: Lungs clear to auscultation without adventitious lung sounds. Musculoskeletal:     Cervical back: Normal range of motion and neck supple.  Neurological:     General: No focal deficit present.     Mental Status: She is alert and oriented to person, place, and time.      UC Treatments / Results  Labs (all labs ordered are listed, but only abnormal results are displayed) Labs Reviewed - No data to display  EKG   Radiology No results found.  Procedures Procedures (including critical care time)  Medications Ordered in UC Medications - No data to display  Initial Impression / Assessment and Plan / UC Course  I have reviewed the triage vital signs and the nursing notes.  Pertinent labs & imaging results that were available during my care of the patient were reviewed by me and considered in my medical decision making (see chart for details).    History and exam most consistent with sinus headache. No signs of bacterial sinusitis. Start prednisone, flonase, azelastine. Push fluids. Return precautions given. Patient expresses understanding and agrees to plan.  Final Clinical Impressions(s) / UC Diagnoses   Final diagnoses:  Sinus pressure  Acute dysfunction of right eustachian tube    ED Prescriptions    Medication Sig Dispense Auth. Provider   predniSONE (DELTASONE) 50 MG tablet Take 1 tablet (50 mg total) by mouth daily with breakfast. 5 tablet Rondo Spittler V, PA-C   fluticasone (FLONASE) 50 MCG/ACT nasal spray Place 2 sprays into both nostrils daily. 1 g Amaurie Schreckengost V, PA-C   azelastine (ASTELIN) 0.1 % nasal spray Place 2 sprays into both nostrils 2 (two)  times daily. 30 mL Ok Edwards, PA-C     PDMP not reviewed this encounter.   Ok Edwards, PA-C 05/10/19 1116

## 2019-05-10 NOTE — Discharge Instructions (Signed)
Start prednisone as directed. Flonase and azelastine as directed. Can add allergy medicine such as zyrtec if you would like. Follow up with ENT if symptoms not improving.

## 2019-05-24 ENCOUNTER — Ambulatory Visit: Payer: Self-pay

## 2019-05-27 ENCOUNTER — Other Ambulatory Visit (HOSPITAL_COMMUNITY): Payer: BC Managed Care – PPO | Attending: Internal Medicine

## 2019-05-29 ENCOUNTER — Ambulatory Visit (HOSPITAL_BASED_OUTPATIENT_CLINIC_OR_DEPARTMENT_OTHER): Payer: BC Managed Care – PPO | Admitting: Internal Medicine

## 2019-06-06 ENCOUNTER — Ambulatory Visit: Payer: BC Managed Care – PPO

## 2019-06-27 DIAGNOSIS — R748 Abnormal levels of other serum enzymes: Secondary | ICD-10-CM | POA: Diagnosis not present

## 2019-06-27 DIAGNOSIS — I10 Essential (primary) hypertension: Secondary | ICD-10-CM | POA: Diagnosis not present

## 2019-06-27 DIAGNOSIS — E782 Mixed hyperlipidemia: Secondary | ICD-10-CM | POA: Diagnosis not present

## 2019-06-27 DIAGNOSIS — E669 Obesity, unspecified: Secondary | ICD-10-CM | POA: Diagnosis not present

## 2019-06-27 DIAGNOSIS — R7303 Prediabetes: Secondary | ICD-10-CM | POA: Diagnosis not present

## 2019-07-26 ENCOUNTER — Other Ambulatory Visit: Payer: Self-pay

## 2019-07-26 ENCOUNTER — Ambulatory Visit
Admission: RE | Admit: 2019-07-26 | Discharge: 2019-07-26 | Disposition: A | Discharge status: Home / Self Care | Payer: BC Managed Care – PPO | Source: Ambulatory Visit | Attending: Internal Medicine | Admitting: Internal Medicine

## 2019-07-26 DIAGNOSIS — Z1231 Encounter for screening mammogram for malignant neoplasm of breast: Secondary | ICD-10-CM

## 2019-08-02 ENCOUNTER — Other Ambulatory Visit (HOSPITAL_COMMUNITY): Payer: BC Managed Care – PPO

## 2019-08-04 ENCOUNTER — Encounter (HOSPITAL_BASED_OUTPATIENT_CLINIC_OR_DEPARTMENT_OTHER): Payer: BC Managed Care – PPO | Admitting: Internal Medicine

## 2019-08-06 ENCOUNTER — Other Ambulatory Visit: Payer: Self-pay

## 2019-08-06 ENCOUNTER — Ambulatory Visit (HOSPITAL_BASED_OUTPATIENT_CLINIC_OR_DEPARTMENT_OTHER): Payer: BC Managed Care – PPO | Attending: Internal Medicine | Admitting: Internal Medicine

## 2019-08-06 DIAGNOSIS — G4710 Hypersomnia, unspecified: Secondary | ICD-10-CM | POA: Insufficient documentation

## 2019-08-06 DIAGNOSIS — Z79899 Other long term (current) drug therapy: Secondary | ICD-10-CM | POA: Diagnosis not present

## 2019-08-06 DIAGNOSIS — G4700 Insomnia, unspecified: Secondary | ICD-10-CM | POA: Diagnosis not present

## 2019-08-06 DIAGNOSIS — G4730 Sleep apnea, unspecified: Secondary | ICD-10-CM

## 2019-08-06 DIAGNOSIS — R0681 Apnea, not elsewhere classified: Secondary | ICD-10-CM | POA: Insufficient documentation

## 2019-08-06 DIAGNOSIS — R5383 Other fatigue: Secondary | ICD-10-CM | POA: Diagnosis not present

## 2019-08-06 DIAGNOSIS — R400 Somnolence: Secondary | ICD-10-CM | POA: Diagnosis not present

## 2019-08-06 DIAGNOSIS — R0683 Snoring: Secondary | ICD-10-CM | POA: Insufficient documentation

## 2019-08-08 DIAGNOSIS — E669 Obesity, unspecified: Secondary | ICD-10-CM | POA: Diagnosis not present

## 2019-08-08 DIAGNOSIS — I10 Essential (primary) hypertension: Secondary | ICD-10-CM | POA: Diagnosis not present

## 2019-08-08 DIAGNOSIS — Z6834 Body mass index (BMI) 34.0-34.9, adult: Secondary | ICD-10-CM | POA: Diagnosis not present

## 2019-08-13 DIAGNOSIS — G4710 Hypersomnia, unspecified: Secondary | ICD-10-CM | POA: Diagnosis not present

## 2019-08-13 NOTE — Procedures (Signed)
   NAME: Norma [name] DATE OF BIRTH:  January 22, 1961 MEDICAL RECORD NUMBER [id number]  LOCATION: [facility]  PHYSICIAN: Deretha Emory  DATE OF STUDY: 08/06/2019  SLEEP STUDY TYPE: Nocturnal Polysomnogram               REFERRING PHYSICIAN: Deretha Emory, MD  INDICATION FOR STUDY: excessive daytime sleepiness. Unexpectedly negative HSAT  EPWORTH SLEEPINESS SCORE:  NA HEIGHT: 5\' 5"  (165.1 cm)  WEIGHT: 199 lb (90.3 kg)    Body mass index is 33.12 kg/m.  NECK SIZE: 14 in.  MEDICATIONS Patient self administered medications include: KLONOPIN. Medications administered during study include No sleep medicine administered.  SLEEP STUDY TECHNIQUE A multi-channel overnight Polysomnography study was performed. The channels recorded and monitored were central and occipital EEG, electrooculogram (EOG), submentalis EMG (chin), nasal and oral airflow, thoracic and abdominal wall motion, anterior tibialis EMG, snore microphone, electrocardiogram, and a pulse oximetry.  TECHNICAL COMMENTS Comments added by Technician: NONE Comments added by Scorer: N/A  SLEEP ARCHITECTURE The study was initiated at 9:48:53 PM and terminated at 5:01:00 AM. The total recorded time was 432.1 minutes. EEG confirmed total sleep time was 386.5 minutes yielding a sleep efficiency of 89.4%%. Sleep onset after lights out was 22.4 minutes with a REM latency of 228.0 minutes. The patient spent 5.8%% of the night in stage N1 sleep, 81.4%% in stage N2 sleep, 0.0%% in stage N3 and 12.8% in REM. Wake after sleep onset (WASO) was 23.2 minutes. The Arousal Index was 10.4/hour.  RESPIRATORY PARAMETERS There were a total of 0 respiratory disturbances out of which 0 were apneas ( 0 obstructive, 0 mixed, 0 central) and 0 hypopneas. The apnea/hypopnea index (AHI) was 0.0 events/hour. The central sleep apnea index was 0.0 events/hour. RDI was 2 events/hour. The REM AHI was 0.0 events/hour and NREM AHI was 0.0  events/hour. The supine AHI was 0.0 events/hour and the non supine AHI was 0 events/hour. Respiratory disturbances were associated with oxygen desaturation down to a nadir of 90.0% during sleep. The mean oxygen saturation during the study was 93.3%. The cumulative time under 88% oxygen saturation was 0 minutes.  LEG MOVEMENT DATA The total leg movements were 0 with a resulting leg movement index of 0.0/hr . Associated arousal with leg movement index was 0.0/hr.  CARDIAC DATA The underlying cardiac rhythm was most consistent with sinus rhythm. Mean heart rate during sleep was 71.7 bpm. Additional rhythm abnormalities include PVCs.  IMPRESSIONS - No Significant Obstructive Sleep apnea(OSA) - No Significant Central Sleep Apnea (CSA) - No significant periodic leg movements(PLMs) during sleep.   DIAGNOSIS - Excessive daytime sleepiness   RECOMMENDATIONS - There is no indication for treatment of sleep disordered breathing.    Marland Kitchen Sleep specialist, American Board of Internal Medicine  ELECTRONICALLY SIGNED ON:  08/13/2019, 8:19 PM [facility] PH: [phone]   FX: [phone] ACCREDITED BY THE AMERICAN ACADEMY OF SLEEP MEDICINE

## 2019-09-18 DIAGNOSIS — Z6834 Body mass index (BMI) 34.0-34.9, adult: Secondary | ICD-10-CM | POA: Diagnosis not present

## 2019-09-18 DIAGNOSIS — I10 Essential (primary) hypertension: Secondary | ICD-10-CM | POA: Diagnosis not present

## 2019-09-18 DIAGNOSIS — E669 Obesity, unspecified: Secondary | ICD-10-CM | POA: Diagnosis not present

## 2019-11-11 DIAGNOSIS — M722 Plantar fascial fibromatosis: Secondary | ICD-10-CM | POA: Diagnosis not present

## 2019-11-11 DIAGNOSIS — M79672 Pain in left foot: Secondary | ICD-10-CM | POA: Diagnosis not present

## 2020-01-20 DIAGNOSIS — B349 Viral infection, unspecified: Secondary | ICD-10-CM | POA: Diagnosis not present

## 2020-01-31 DIAGNOSIS — S8391XA Sprain of unspecified site of right knee, initial encounter: Secondary | ICD-10-CM | POA: Diagnosis not present

## 2020-01-31 DIAGNOSIS — M25561 Pain in right knee: Secondary | ICD-10-CM | POA: Diagnosis not present

## 2020-02-27 DIAGNOSIS — E559 Vitamin D deficiency, unspecified: Secondary | ICD-10-CM | POA: Diagnosis not present

## 2020-02-27 DIAGNOSIS — F419 Anxiety disorder, unspecified: Secondary | ICD-10-CM | POA: Diagnosis not present

## 2020-02-27 DIAGNOSIS — I10 Essential (primary) hypertension: Secondary | ICD-10-CM | POA: Diagnosis not present

## 2020-02-27 DIAGNOSIS — F322 Major depressive disorder, single episode, severe without psychotic features: Secondary | ICD-10-CM | POA: Diagnosis not present

## 2020-02-27 DIAGNOSIS — R918 Other nonspecific abnormal finding of lung field: Secondary | ICD-10-CM | POA: Diagnosis not present

## 2020-02-27 DIAGNOSIS — Z720 Tobacco use: Secondary | ICD-10-CM | POA: Diagnosis not present

## 2020-02-27 DIAGNOSIS — Z23 Encounter for immunization: Secondary | ICD-10-CM | POA: Diagnosis not present

## 2020-02-27 DIAGNOSIS — E785 Hyperlipidemia, unspecified: Secondary | ICD-10-CM | POA: Diagnosis not present

## 2020-04-21 DIAGNOSIS — M25511 Pain in right shoulder: Secondary | ICD-10-CM | POA: Diagnosis not present

## 2020-04-21 DIAGNOSIS — M722 Plantar fascial fibromatosis: Secondary | ICD-10-CM | POA: Diagnosis not present

## 2020-04-21 DIAGNOSIS — M79672 Pain in left foot: Secondary | ICD-10-CM | POA: Diagnosis not present

## 2020-05-11 DIAGNOSIS — J45909 Unspecified asthma, uncomplicated: Secondary | ICD-10-CM | POA: Diagnosis not present

## 2020-05-14 ENCOUNTER — Other Ambulatory Visit: Payer: Self-pay | Admitting: Internal Medicine

## 2020-05-14 DIAGNOSIS — R918 Other nonspecific abnormal finding of lung field: Secondary | ICD-10-CM

## 2020-05-19 ENCOUNTER — Other Ambulatory Visit: Payer: Self-pay | Admitting: Orthopedic Surgery

## 2020-05-19 DIAGNOSIS — Z79890 Hormone replacement therapy (postmenopausal): Secondary | ICD-10-CM | POA: Diagnosis not present

## 2020-05-19 DIAGNOSIS — M25511 Pain in right shoulder: Secondary | ICD-10-CM

## 2020-05-19 DIAGNOSIS — Z01419 Encounter for gynecological examination (general) (routine) without abnormal findings: Secondary | ICD-10-CM | POA: Diagnosis not present

## 2020-05-19 DIAGNOSIS — R32 Unspecified urinary incontinence: Secondary | ICD-10-CM | POA: Diagnosis not present

## 2020-05-23 ENCOUNTER — Other Ambulatory Visit: Payer: Self-pay

## 2020-05-23 ENCOUNTER — Ambulatory Visit
Admission: RE | Admit: 2020-05-23 | Discharge: 2020-05-23 | Disposition: A | Discharge status: Home / Self Care | Payer: BC Managed Care – PPO | Source: Ambulatory Visit | Attending: Orthopedic Surgery | Admitting: Orthopedic Surgery

## 2020-05-23 DIAGNOSIS — M25511 Pain in right shoulder: Secondary | ICD-10-CM

## 2020-05-27 ENCOUNTER — Ambulatory Visit
Admission: RE | Admit: 2020-05-27 | Discharge: 2020-05-27 | Disposition: A | Discharge status: Home / Self Care | Payer: BC Managed Care – PPO | Source: Ambulatory Visit | Attending: Internal Medicine | Admitting: Internal Medicine

## 2020-05-27 ENCOUNTER — Other Ambulatory Visit: Payer: Self-pay

## 2020-05-27 DIAGNOSIS — R918 Other nonspecific abnormal finding of lung field: Secondary | ICD-10-CM

## 2020-06-02 DIAGNOSIS — M25511 Pain in right shoulder: Secondary | ICD-10-CM | POA: Diagnosis not present

## 2020-06-02 DIAGNOSIS — M25562 Pain in left knee: Secondary | ICD-10-CM | POA: Diagnosis not present

## 2020-06-03 ENCOUNTER — Other Ambulatory Visit: Payer: Self-pay | Admitting: Orthopedic Surgery

## 2020-06-03 DIAGNOSIS — M25562 Pain in left knee: Secondary | ICD-10-CM

## 2020-06-12 ENCOUNTER — Ambulatory Visit
Admission: RE | Admit: 2020-06-12 | Discharge: 2020-06-12 | Disposition: A | Discharge status: Home / Self Care | Payer: BC Managed Care – PPO | Source: Ambulatory Visit | Attending: Orthopedic Surgery | Admitting: Orthopedic Surgery

## 2020-06-12 ENCOUNTER — Other Ambulatory Visit: Payer: Self-pay

## 2020-06-12 DIAGNOSIS — M25562 Pain in left knee: Secondary | ICD-10-CM | POA: Diagnosis not present

## 2020-06-28 ENCOUNTER — Other Ambulatory Visit: Payer: Self-pay

## 2020-06-28 ENCOUNTER — Encounter (HOSPITAL_BASED_OUTPATIENT_CLINIC_OR_DEPARTMENT_OTHER): Payer: Self-pay | Admitting: Obstetrics and Gynecology

## 2020-06-28 ENCOUNTER — Emergency Department (HOSPITAL_BASED_OUTPATIENT_CLINIC_OR_DEPARTMENT_OTHER)
Admission: EM | Admit: 2020-06-28 | Discharge: 2020-06-29 | Disposition: A | Discharge status: Home / Self Care | Payer: BC Managed Care – PPO | Attending: Emergency Medicine | Admitting: Emergency Medicine

## 2020-06-28 ENCOUNTER — Emergency Department (HOSPITAL_BASED_OUTPATIENT_CLINIC_OR_DEPARTMENT_OTHER): Payer: BC Managed Care – PPO

## 2020-06-28 DIAGNOSIS — Z20822 Contact with and (suspected) exposure to covid-19: Secondary | ICD-10-CM | POA: Insufficient documentation

## 2020-06-28 DIAGNOSIS — R079 Chest pain, unspecified: Secondary | ICD-10-CM | POA: Diagnosis not present

## 2020-06-28 DIAGNOSIS — R110 Nausea: Secondary | ICD-10-CM | POA: Diagnosis not present

## 2020-06-28 DIAGNOSIS — F17210 Nicotine dependence, cigarettes, uncomplicated: Secondary | ICD-10-CM | POA: Diagnosis not present

## 2020-06-28 DIAGNOSIS — R109 Unspecified abdominal pain: Secondary | ICD-10-CM | POA: Diagnosis not present

## 2020-06-28 DIAGNOSIS — R1013 Epigastric pain: Secondary | ICD-10-CM | POA: Diagnosis not present

## 2020-06-28 DIAGNOSIS — R0789 Other chest pain: Secondary | ICD-10-CM | POA: Diagnosis not present

## 2020-06-28 DIAGNOSIS — K8020 Calculus of gallbladder without cholecystitis without obstruction: Secondary | ICD-10-CM | POA: Diagnosis not present

## 2020-06-28 DIAGNOSIS — J9811 Atelectasis: Secondary | ICD-10-CM | POA: Diagnosis not present

## 2020-06-28 LAB — CBC
HCT: 40 % (ref 36.0–46.0)
Hemoglobin: 13.5 g/dL (ref 12.0–15.0)
MCH: 31.4 pg (ref 26.0–34.0)
MCHC: 33.8 g/dL (ref 30.0–36.0)
MCV: 93 fL (ref 80.0–100.0)
Platelets: 263 10*3/uL (ref 150–400)
RBC: 4.3 MIL/uL (ref 3.87–5.11)
RDW: 13.5 % (ref 11.5–15.5)
WBC: 12.8 10*3/uL — ABNORMAL HIGH (ref 4.0–10.5)
nRBC: 0 % (ref 0.0–0.2)

## 2020-06-28 LAB — HEPATIC FUNCTION PANEL
ALT: 19 U/L (ref 0–44)
AST: 15 U/L (ref 15–41)
Albumin: 4.1 g/dL (ref 3.5–5.0)
Alkaline Phosphatase: 54 U/L (ref 38–126)
Bilirubin, Direct: <0.1 mg/dL (ref 0.0–0.2)
Total Bilirubin: 0.3 mg/dL (ref 0.3–1.2)
Total Protein: 6.8 g/dL (ref 6.5–8.1)

## 2020-06-28 LAB — URINALYSIS, ROUTINE W REFLEX MICROSCOPIC
Bilirubin Urine: NEGATIVE
Glucose, UA: NEGATIVE mg/dL
Hgb urine dipstick: NEGATIVE
Ketones, ur: NEGATIVE mg/dL
Leukocytes,Ua: NEGATIVE
Nitrite: NEGATIVE
Protein, ur: NEGATIVE mg/dL
Specific Gravity, Urine: 1.017 (ref 1.005–1.030)
pH: 6.5 (ref 5.0–8.0)

## 2020-06-28 LAB — BASIC METABOLIC PANEL
Anion gap: 10 (ref 5–15)
BUN: 17 mg/dL (ref 8–23)
CO2: 25 mmol/L (ref 22–32)
Calcium: 9 mg/dL (ref 8.9–10.3)
Chloride: 102 mmol/L (ref 98–111)
Creatinine, Ser: 0.61 mg/dL (ref 0.44–1.00)
GFR, Estimated: >60 mL/min (ref >60)
Glucose, Bld: 145 mg/dL — ABNORMAL HIGH (ref 70–99)
Potassium: 3.9 mmol/L (ref 3.5–5.1)
Sodium: 137 mmol/L (ref 135–145)

## 2020-06-28 LAB — TROPONIN I (HIGH SENSITIVITY): Troponin I (High Sensitivity): 3 ng/L (ref <18)

## 2020-06-28 LAB — LIPASE, BLOOD: Lipase: 24 U/L (ref 11–51)

## 2020-06-28 MED ORDER — IOHEXOL 9 MG/ML PO SOLN
250.0000 mL | ORAL | Status: AC
  Administered 2020-06-28 – 2020-06-29 (×2): 250 mL via ORAL

## 2020-06-28 MED ORDER — ONDANSETRON HCL 4 MG/2ML IJ SOLN
4.0000 mg | Freq: Once | INTRAMUSCULAR | Status: AC
  Administered 2020-06-28: 4 mg via INTRAVENOUS
  Filled 2020-06-28: qty 2

## 2020-06-28 MED ORDER — SODIUM CHLORIDE 0.9 % IV BOLUS
1000.0000 mL | Freq: Once | INTRAVENOUS | Status: AC
  Administered 2020-06-28: 1000 mL via INTRAVENOUS

## 2020-06-28 MED ORDER — HYDROMORPHONE HCL 1 MG/ML IJ SOLN
0.5000 mg | Freq: Once | INTRAMUSCULAR | Status: AC
  Administered 2020-06-28: 0.5 mg via INTRAVENOUS
  Filled 2020-06-28: qty 1

## 2020-06-28 MED ORDER — HYDROMORPHONE HCL 1 MG/ML IJ SOLN
1.0000 mg | Freq: Once | INTRAMUSCULAR | Status: AC
  Administered 2020-06-28: 1 mg via INTRAVENOUS
  Filled 2020-06-28: qty 1

## 2020-06-28 MED ORDER — SODIUM CHLORIDE 0.9 % IV SOLN: INTRAVENOUS | Status: DC

## 2020-06-28 NOTE — ED Triage Notes (Signed)
Patient reports low chest/upper abdominal pain that shoots to the back. Patient reports it feels like someone is sitting on her sternum. Patient denies e/d but endorses nausea. Patient denies jaw or arm pain.

## 2020-06-28 NOTE — ED Provider Notes (Signed)
[facility] EMERGENCY DEPT Provider Note   CSN: [id number] Arrival date & time: 06/28/20  2208     History Chief Complaint  Patient presents with  . Chest Pain  . Abdominal Pain    Norma [name] is a 60 y.o. female.  HPI   Patient presents to the ED for evaluation of upper abdominal pain.  Patient states symptoms started this evening around 5 PM.  The pain is in her upper abdomen and radiates to her back.  Pain is very sharp and intense and also feels like something is pushing onto her.  She has had nausea but no vomiting.  She denies any dysuria.  She is not having any shortness of breath.  Patient denies any history of heart disease.  She has had a prior appendectomy  Past Medical History:  Diagnosis Date  . Anxiety   . Depression   . PONV (postoperative nausea and vomiting)   . Snores   . Volar retinacular ganglion     Patient Active Problem List   Diagnosis Date Noted  . Snoring 08/06/2019  . Witnessed episode of apnea 08/06/2019  . Excessive sleepiness 08/06/2019  . Fatigue 08/06/2019  . Insomnia 08/06/2019  . Radiculopathy 04/10/2013    Past Surgical History:  Procedure Laterality Date  . ABDOMINAL HYSTERECTOMY    . ANTERIOR CERVICAL DECOMP/DISCECTOMY FUSION N/A 04/10/2013   Procedure: ANTERIOR CERVICAL DECOMPRESSION/DISCECTOMY FUSION 1 LEVEL;  Surgeon: Emilee Hero, MD;  Location: [facility] OR;  Service: Orthopedics;  Laterality: N/A;  Anterior cervical decompression fusion, cervical 6-7 with instrumentation and allograft  . APPENDECTOMY    . BARTHOLIN CYST MARSUPIALIZATION N/A 03/07/2013   Procedure: BARTHOLIN CYST MARSUPIALIZATION;  Surgeon: Sharon Seller, DO;  Location: [facility];  Service: Gynecology;  Laterality: N/A;  . COLONOSCOPY    . COLONOSCOPY WITH PROPOFOL N/A 06/15/2016   Procedure: COLONOSCOPY WITH PROPOFOL;  Surgeon: Charolett Bumpers, MD;  Location: [facility];  Service: Endoscopy;  Laterality: N/A;  . INCISION AND DRAINAGE HIP      RT  . REDUCTION MAMMAPLASTY Bilateral 1978  . SHOULDER ARTHROSCOPY     RT  . WRIST GANGLION EXCISION     X2 RT WRIST, X1 LT WRIST     OB History    Gravida  2   Para  2   Term      Preterm      AB      Living  2     SAB      IAB      Ectopic      Multiple      Live Births              Family History  Problem Relation Age of Onset  . Cancer Mother   . Cancer Father   . Breast cancer Neg Hx     Social History   Tobacco Use  . Smoking status: Current Every Day Smoker    Packs/day: 0.25    Years: 46.00    Pack years: 11.50    Types: Cigarettes  . Smokeless tobacco: Never Used  Vaping Use  . Vaping Use: Never used  Substance Use Topics  . Alcohol use: Yes    Comment: SOCIAL  . Drug use: No    Home Medications Prior to Admission medications   Medication Sig Start Date End Date Taking? Authorizing Provider  azelastine (ASTELIN) 0.1 % nasal spray Place 2 sprays into both nostrils 2 (two) times daily. 05/10/19  Yes Yu, Amy V, PA-C  buPROPion (WELLBUTRIN SR) 150 MG 12 hr tablet Take 150 mg by mouth every evening.   Yes [provider]  citalopram (CELEXA) 40 MG tablet Take 40 mg by mouth every evening.   Yes [provider]  clonazePAM (KLONOPIN) 1 MG tablet Take 1 mg by mouth at bedtime.    Yes [provider]  fenofibrate 160 MG tablet Take 160 mg by mouth daily.   Yes [provider]  Omega-3 Fatty Acids (FISH OIL PO) Take 2 capsules by mouth every evening.   Yes [provider]  cyclobenzaprine (FLEXERIL) 10 MG tablet Take 1 tablet (10 mg total) by mouth 2 (two) times daily as needed for muscle spasms. 08/24/18   Walisiewicz, Yvonna Alanis E, PA-C  estradiol (ESTRACE) 1 MG tablet Take 1 mg by mouth every evening.     [provider]  fluticasone (FLONASE) 50 MCG/ACT nasal spray Place 2 sprays into both nostrils daily. 05/10/19   Cathie Hoops, Amy V, PA-C  ibuprofen (ADVIL,MOTRIN) 800 MG tablet Take 1 tablet (800  mg total) by mouth 3 (three) times daily. Patient taking differently: Take 800 mg by mouth daily as needed for moderate pain. 04/15/16   Long, Arlyss Repress, MD  ondansetron (ZOFRAN ODT) 4 MG disintegrating tablet Take 1 tablet (4 mg total) by mouth every 8 (eight) hours as needed for nausea or vomiting. 07/28/18   Little, Ambrose Finland, MD  predniSONE (DELTASONE) 50 MG tablet Take 1 tablet (50 mg total) by mouth daily with breakfast. 05/10/19   Cathie Hoops, Amy V, PA-C    Allergies    Codeine and Other  Review of Systems   Review of Systems  All other systems reviewed and are negative.   Physical Exam Updated Vital Signs BP (!) 185/92 (BP Location: Right Arm)   Pulse 77   Temp 98.4 F (36.9 C) (Oral)   Resp 18   SpO2 100%   Physical Exam Vitals and nursing note reviewed.  Constitutional:      General: She is not in acute distress.    Appearance: She is well-developed.  HENT:     Head: Normocephalic and atraumatic.     Right Ear: External ear normal.     Left Ear: External ear normal.  Eyes:     General: No scleral icterus.       Right eye: No discharge.        Left eye: No discharge.     Conjunctiva/sclera: Conjunctivae normal.  Neck:     Trachea: No tracheal deviation.  Cardiovascular:     Rate and Rhythm: Normal rate and regular rhythm.  Pulmonary:     Effort: Pulmonary effort is normal. No respiratory distress.     Breath sounds: Normal breath sounds. No stridor. No wheezing or rales.  Abdominal:     General: Bowel sounds are normal. There is no distension.     Palpations: Abdomen is soft.     Tenderness: There is abdominal tenderness in the epigastric area. There is guarding. There is no rebound.     Hernia: No hernia is present.  Musculoskeletal:        General: No tenderness.     Cervical back: Neck supple.  Skin:    General: Skin is warm and dry.     Findings: No rash.  Neurological:     Mental Status: She is alert.     Cranial Nerves: No cranial nerve deficit (no  facial droop, extraocular movements intact, no  slurred speech).     Sensory: No sensory deficit.     Motor: No abnormal muscle tone or seizure activity.     Coordination: Coordination normal.     ED Results / Procedures / Treatments   Labs (all labs ordered are listed, but only abnormal results are displayed) Labs Reviewed  CBC - Abnormal; Notable for the following components:      Result Value   WBC 12.8 (*)    All other components within normal limits  BASIC METABOLIC PANEL  LIPASE, BLOOD  URINALYSIS, ROUTINE W REFLEX MICROSCOPIC  HEPATIC FUNCTION PANEL  TROPONIN I (HIGH SENSITIVITY)    EKG EKG Interpretation  Date/Time:  Sunday June 28 2020 22:15:20 EDT Ventricular Rate:  81 PR Interval:  159 QRS Duration: 89 QT Interval:  405 QTC Calculation: 471 R Axis:   72 Text Interpretation: Sinus rhythm Abnormal R-wave progression, early transition No significant change since last tracing Confirmed by Linwood Dibbles [phone]) on 06/28/2020 10:19:33 PM   Radiology No results found.  Procedures Procedures   Medications Ordered in ED Medications  sodium chloride 0.9 % bolus 1,000 mL (has no administration in time range)    And  0.9 %  sodium chloride infusion (has no administration in time range)  HYDROmorphone (DILAUDID) injection 0.5 mg (has no administration in time range)  ondansetron (ZOFRAN) injection 4 mg (has no administration in time range)    ED Course  I have reviewed the triage vital signs and the nursing notes.  Pertinent labs & imaging results that were available during my care of the patient were reviewed by me and considered in my medical decision making (see chart for details).    MDM Rules/Calculators/A&P                          Pt presented with severe upper abdominal pain.  Required IV narcotic pain management.  Pancreatitis, obstruction, cholecystitis, perf viscous considered. Initial labs notable for elevated WBC.  Lipase and lfts  Normal.  CT scan  ordered.  Care turned over to Dr Wilkie Aye. Final Clinical Impression(s) / ED Diagnoses pending   Linwood Dibbles, MD 06/30/20 1108

## 2020-06-29 ENCOUNTER — Emergency Department (HOSPITAL_BASED_OUTPATIENT_CLINIC_OR_DEPARTMENT_OTHER): Payer: BC Managed Care – PPO

## 2020-06-29 DIAGNOSIS — K5730 Diverticulosis of large intestine without perforation or abscess without bleeding: Secondary | ICD-10-CM | POA: Diagnosis not present

## 2020-06-29 LAB — RESP PANEL BY RT-PCR (FLU A&B, COVID) ARPGX2
Influenza A by PCR: NEGATIVE
Influenza B by PCR: NEGATIVE
SARS Coronavirus 2 by RT PCR: NEGATIVE

## 2020-06-29 LAB — TROPONIN I (HIGH SENSITIVITY): Troponin I (High Sensitivity): 3 ng/L (ref <18)

## 2020-06-29 MED ORDER — DIPHENHYDRAMINE HCL 25 MG PO CAPS
25.0000 mg | ORAL_CAPSULE | Freq: Once | ORAL | Status: AC
  Administered 2020-06-29: 25 mg via ORAL
  Filled 2020-06-29: qty 1

## 2020-06-29 MED ORDER — OXYCODONE-ACETAMINOPHEN 5-325 MG PO TABS: 1.0000 | ORAL_TABLET | Freq: Four times a day (QID) | ORAL | 0 refills | Status: DC | PRN

## 2020-06-29 NOTE — Discharge Instructions (Signed)
You were seen today for abdominal pain.  Your work-up is largely reassuring.  You do have some gallstones which can cause intermittent pain which is usually associated with eating.  If you have pain that returns or worsens, you should follow-up with general surgery.

## 2020-06-29 NOTE — ED Notes (Signed)
Patient returned from CT

## 2020-06-29 NOTE — ED Provider Notes (Signed)
Patient signed out pending full labs and imaging.  In brief presented with epigastric discomfort that radiated to her back.  EKG was reassuring.  Chest x-ray without pneumothorax or pneumonia.  Troponin x2 negative.  Basic lab work including LFTs and lipase were negative.  CT imaging obtained.  CT imaging suggestive of gallbladder sludge but no obvious evidence of cholecystitis.  On recheck, patient is comfortable and has been tolerating fluids.  Discussed with her the symptoms that are related to gallbladder disease.  Given no evidence of cholecystitis and no evidence of biliary obstruction, feel patient can follow-up with as an outpatient with general surgery.  Patient is agreeable to plan.  She was given a short course of pain medication in case her symptoms return.  After history, exam, and medical workup I feel the patient has been appropriately medically screened and is safe for discharge home. Pertinent diagnoses were discussed with the patient. Patient was given return precautions.  Physical Exam  BP 123/62   Pulse 89   Temp 98.4 F (36.9 C) (Oral)   Resp (!) 22   Ht 1.651 m (5\' 5" )   Wt 92.2 kg   SpO2 94%   BMI 33.82 kg/m   Problem List Items Addressed This Visit   None   Visit Diagnoses    Epigastric pain    -  Primary   Chest pain       Relevant Orders   DG Chest Port 1 View (Completed)   Calculus of gallbladder without cholecystitis without obstruction              , MD 06/29/20 [phone]

## 2020-06-29 NOTE — ED Notes (Signed)
Pt verbalizes understanding of discharge instructions. Opportunity for questioning and answers were provided. Armand removed by staff, pt discharged from ED to home. Educated to f/u for worsening sx.

## 2020-06-29 NOTE — ED Notes (Signed)
Patient transported to CT 

## 2020-07-21 DIAGNOSIS — J069 Acute upper respiratory infection, unspecified: Secondary | ICD-10-CM | POA: Diagnosis not present

## 2020-07-31 ENCOUNTER — Institutional Professional Consult (permissible substitution): Payer: BC Managed Care – PPO | Admitting: Plastic Surgery

## 2020-08-04 DIAGNOSIS — R058 Other specified cough: Secondary | ICD-10-CM | POA: Diagnosis not present

## 2020-08-04 DIAGNOSIS — R5383 Other fatigue: Secondary | ICD-10-CM | POA: Diagnosis not present

## 2020-08-04 DIAGNOSIS — R0602 Shortness of breath: Secondary | ICD-10-CM | POA: Diagnosis not present

## 2020-08-04 DIAGNOSIS — Z720 Tobacco use: Secondary | ICD-10-CM | POA: Diagnosis not present

## 2020-08-05 ENCOUNTER — Other Ambulatory Visit: Payer: Self-pay | Admitting: Internal Medicine

## 2020-08-05 DIAGNOSIS — E782 Mixed hyperlipidemia: Secondary | ICD-10-CM | POA: Diagnosis not present

## 2020-08-05 DIAGNOSIS — R635 Abnormal weight gain: Secondary | ICD-10-CM | POA: Diagnosis not present

## 2020-08-05 DIAGNOSIS — E8881 Metabolic syndrome: Secondary | ICD-10-CM | POA: Diagnosis not present

## 2020-08-05 DIAGNOSIS — Z720 Tobacco use: Secondary | ICD-10-CM

## 2020-08-05 DIAGNOSIS — R5383 Other fatigue: Secondary | ICD-10-CM | POA: Diagnosis not present

## 2020-08-05 DIAGNOSIS — N951 Menopausal and female climacteric states: Secondary | ICD-10-CM | POA: Diagnosis not present

## 2020-08-07 DIAGNOSIS — Z6836 Body mass index (BMI) 36.0-36.9, adult: Secondary | ICD-10-CM | POA: Diagnosis not present

## 2020-08-07 DIAGNOSIS — Z1331 Encounter for screening for depression: Secondary | ICD-10-CM | POA: Diagnosis not present

## 2020-08-07 DIAGNOSIS — N951 Menopausal and female climacteric states: Secondary | ICD-10-CM | POA: Diagnosis not present

## 2020-08-07 DIAGNOSIS — F330 Major depressive disorder, recurrent, mild: Secondary | ICD-10-CM | POA: Diagnosis not present

## 2020-08-07 DIAGNOSIS — Z1339 Encounter for screening examination for other mental health and behavioral disorders: Secondary | ICD-10-CM | POA: Diagnosis not present

## 2020-08-07 DIAGNOSIS — E782 Mixed hyperlipidemia: Secondary | ICD-10-CM | POA: Diagnosis not present

## 2020-08-10 DIAGNOSIS — Z6837 Body mass index (BMI) 37.0-37.9, adult: Secondary | ICD-10-CM | POA: Diagnosis not present

## 2020-08-10 DIAGNOSIS — R748 Abnormal levels of other serum enzymes: Secondary | ICD-10-CM | POA: Diagnosis not present

## 2020-08-12 ENCOUNTER — Encounter (HOSPITAL_BASED_OUTPATIENT_CLINIC_OR_DEPARTMENT_OTHER): Payer: Self-pay

## 2020-08-12 ENCOUNTER — Emergency Department (HOSPITAL_BASED_OUTPATIENT_CLINIC_OR_DEPARTMENT_OTHER): Payer: BC Managed Care – PPO

## 2020-08-12 ENCOUNTER — Emergency Department (HOSPITAL_BASED_OUTPATIENT_CLINIC_OR_DEPARTMENT_OTHER)
Admission: EM | Admit: 2020-08-12 | Discharge: 2020-08-12 | Disposition: A | Discharge status: Home / Self Care | Payer: BC Managed Care – PPO | Attending: Emergency Medicine | Admitting: Emergency Medicine

## 2020-08-12 ENCOUNTER — Other Ambulatory Visit: Payer: Self-pay

## 2020-08-12 DIAGNOSIS — R112 Nausea with vomiting, unspecified: Secondary | ICD-10-CM | POA: Diagnosis not present

## 2020-08-12 DIAGNOSIS — R1110 Vomiting, unspecified: Secondary | ICD-10-CM | POA: Diagnosis not present

## 2020-08-12 DIAGNOSIS — F17210 Nicotine dependence, cigarettes, uncomplicated: Secondary | ICD-10-CM | POA: Diagnosis not present

## 2020-08-12 DIAGNOSIS — R109 Unspecified abdominal pain: Secondary | ICD-10-CM | POA: Diagnosis not present

## 2020-08-12 DIAGNOSIS — R1013 Epigastric pain: Secondary | ICD-10-CM | POA: Insufficient documentation

## 2020-08-12 DIAGNOSIS — K760 Fatty (change of) liver, not elsewhere classified: Secondary | ICD-10-CM | POA: Diagnosis not present

## 2020-08-12 LAB — URINALYSIS, ROUTINE W REFLEX MICROSCOPIC
Bilirubin Urine: NEGATIVE
Glucose, UA: NEGATIVE mg/dL
Hgb urine dipstick: NEGATIVE
Ketones, ur: NEGATIVE mg/dL
Nitrite: NEGATIVE
Specific Gravity, Urine: >1.046 — ABNORMAL HIGH (ref 1.005–1.030)
pH: 6 (ref 5.0–8.0)

## 2020-08-12 LAB — COMPREHENSIVE METABOLIC PANEL
ALT: 21 U/L (ref 0–44)
AST: 17 U/L (ref 15–41)
Albumin: 4 g/dL (ref 3.5–5.0)
Alkaline Phosphatase: 75 U/L (ref 38–126)
Anion gap: 10 (ref 5–15)
BUN: 15 mg/dL (ref 8–23)
CO2: 25 mmol/L (ref 22–32)
Calcium: 9 mg/dL (ref 8.9–10.3)
Chloride: 104 mmol/L (ref 98–111)
Creatinine, Ser: 0.63 mg/dL (ref 0.44–1.00)
GFR, Estimated: >60 mL/min (ref >60)
Glucose, Bld: 165 mg/dL — ABNORMAL HIGH (ref 70–99)
Potassium: 4 mmol/L (ref 3.5–5.1)
Sodium: 139 mmol/L (ref 135–145)
Total Bilirubin: 0.3 mg/dL (ref 0.3–1.2)
Total Protein: 7.4 g/dL (ref 6.5–8.1)

## 2020-08-12 LAB — CBC
HCT: 39.9 % (ref 36.0–46.0)
Hemoglobin: 13.4 g/dL (ref 12.0–15.0)
MCH: 31.5 pg (ref 26.0–34.0)
MCHC: 33.6 g/dL (ref 30.0–36.0)
MCV: 93.7 fL (ref 80.0–100.0)
Platelets: 284 10*3/uL (ref 150–400)
RBC: 4.26 MIL/uL (ref 3.87–5.11)
RDW: 13.2 % (ref 11.5–15.5)
WBC: 12.4 10*3/uL — ABNORMAL HIGH (ref 4.0–10.5)
nRBC: 0 % (ref 0.0–0.2)

## 2020-08-12 LAB — LIPASE, BLOOD: Lipase: 31 U/L (ref 11–51)

## 2020-08-12 LAB — TROPONIN I (HIGH SENSITIVITY): Troponin I (High Sensitivity): 4 ng/L (ref <18)

## 2020-08-12 MED ORDER — MORPHINE SULFATE (PF) 4 MG/ML IV SOLN
4.0000 mg | Freq: Once | INTRAVENOUS | Status: AC
  Administered 2020-08-12: 4 mg via INTRAVENOUS
  Filled 2020-08-12: qty 1

## 2020-08-12 MED ORDER — HYDROMORPHONE HCL 1 MG/ML IJ SOLN
1.0000 mg | Freq: Once | INTRAMUSCULAR | Status: AC
  Administered 2020-08-12: 1 mg via INTRAVENOUS
  Filled 2020-08-12: qty 1

## 2020-08-12 MED ORDER — OXYCODONE-ACETAMINOPHEN 5-325 MG PO TABS
1.0000 | ORAL_TABLET | Freq: Once | ORAL | Status: AC
  Administered 2020-08-12: 1 via ORAL
  Filled 2020-08-12: qty 1

## 2020-08-12 MED ORDER — OXYCODONE-ACETAMINOPHEN 5-325 MG PO TABS: 1.0000 | ORAL_TABLET | Freq: Four times a day (QID) | ORAL | 0 refills | Status: DC | PRN

## 2020-08-12 MED ORDER — ALUM & MAG HYDROXIDE-SIMETH 200-200-20 MG/5ML PO SUSP
15.0000 mL | Freq: Once | ORAL | Status: AC
  Administered 2020-08-12: 15 mL via ORAL
  Filled 2020-08-12: qty 30

## 2020-08-12 MED ORDER — PANTOPRAZOLE SODIUM 40 MG IV SOLR
40.0000 mg | Freq: Once | INTRAVENOUS | Status: AC
  Administered 2020-08-12: 40 mg via INTRAVENOUS
  Filled 2020-08-12: qty 40

## 2020-08-12 MED ORDER — ONDANSETRON 4 MG PO TBDP: 4.0000 mg | ORAL_TABLET | Freq: Three times a day (TID) | ORAL | 0 refills | Status: AC | PRN

## 2020-08-12 MED ORDER — IOHEXOL 300 MG/ML  SOLN: 100.0000 mL | Freq: Once | INTRAMUSCULAR | Status: DC | PRN

## 2020-08-12 MED ORDER — PANTOPRAZOLE SODIUM 40 MG PO TBEC: 40.0000 mg | DELAYED_RELEASE_TABLET | Freq: Every day | ORAL | 0 refills | Status: DC

## 2020-08-12 MED ORDER — DIPHENHYDRAMINE HCL 25 MG PO CAPS
25.0000 mg | ORAL_CAPSULE | Freq: Once | ORAL | Status: AC
  Administered 2020-08-12: 25 mg via ORAL
  Filled 2020-08-12: qty 1

## 2020-08-12 MED ORDER — ONDANSETRON HCL 4 MG/2ML IJ SOLN
4.0000 mg | Freq: Once | INTRAMUSCULAR | Status: AC | PRN
  Administered 2020-08-12: 4 mg via INTRAVENOUS

## 2020-08-12 MED ORDER — IOHEXOL 350 MG/ML SOLN
100.0000 mL | Freq: Once | INTRAVENOUS | Status: AC | PRN
  Administered 2020-08-12: 75 mL via INTRAVENOUS

## 2020-08-12 NOTE — Discharge Instructions (Addendum)
You were seen in the emergency room today with abdominal pain and vomiting.  I have called in additional pain and nausea medications to your pharmacy.  Please call the surgery team to confirm your follow-up appointment and see if an earlier appointment is available.  If your symptoms worsen or you develop fever please return to the emergency department for reevaluation.

## 2020-08-12 NOTE — ED Provider Notes (Signed)
Emergency Department Provider Note   I have reviewed the triage vital signs and the nursing notes.   HISTORY  Chief Complaint Abdominal Pain   HPI Norma [name] is a 60 y.o. female with past medical history reviewed below presents to the emergency room with epigastric abdominal pain radiating to the back with associated nausea and vomiting.  Symptoms worsening in the past 12 hours.  She was seen in the emergency department in June with similar symptoms.  She called to schedule an appointment with general surgery and has an appointment in 1 week, which was the soonest they could see her.  She denies any fevers or chills.  No pain radiating up into the chest or shortness of breath. No other modifying factors.   Past Medical History:  Diagnosis Date   Anxiety    Depression    PONV (postoperative nausea and vomiting)    Snores    Volar retinacular ganglion     Patient Active Problem List   Diagnosis Date Noted   Snoring 08/06/2019   Witnessed episode of apnea 08/06/2019   Excessive sleepiness 08/06/2019   Fatigue 08/06/2019   Insomnia 08/06/2019   Radiculopathy 04/10/2013    Past Surgical History:  Procedure Laterality Date   ABDOMINAL HYSTERECTOMY     ANTERIOR CERVICAL DECOMP/DISCECTOMY FUSION N/A 04/10/2013   Procedure: ANTERIOR CERVICAL DECOMPRESSION/DISCECTOMY FUSION 1 LEVEL;  Surgeon: Emilee Hero, MD;  Location: [facility] OR;  Service: Orthopedics;  Laterality: N/A;  Anterior cervical decompression fusion, cervical 6-7 with instrumentation and allograft   APPENDECTOMY     BARTHOLIN CYST MARSUPIALIZATION N/A 03/07/2013   Procedure: BARTHOLIN CYST MARSUPIALIZATION;  Surgeon: Sharon Seller, DO;  Location: [facility];  Service: Gynecology;  Laterality: N/A;   COLONOSCOPY     COLONOSCOPY WITH PROPOFOL N/A 06/15/2016   Procedure: COLONOSCOPY WITH PROPOFOL;  Surgeon: Charolett Bumpers, MD;  Location: [facility];  Service: Endoscopy;  Laterality: N/A;   INCISION AND DRAINAGE  HIP     RT   REDUCTION MAMMAPLASTY Bilateral 1978   SHOULDER ARTHROSCOPY     RT   WRIST GANGLION EXCISION     X2 RT WRIST, X1 LT WRIST    Allergies Codeine and Other  Family History  Problem Relation Age of Onset   Cancer Mother    Cancer Father    Breast cancer Neg Hx     Social History Social History   Tobacco Use   Smoking status: Every Day    Packs/day: 0.25    Years: 46.00    Pack years: 11.50    Types: Cigarettes   Smokeless tobacco: Never  Vaping Use   Vaping Use: Never used  Substance Use Topics   Alcohol use: Yes    Comment: SOCIAL   Drug use: No    Review of Systems  Constitutional: No fever/chills Eyes: No visual changes. ENT: No sore throat. Cardiovascular: Denies chest pain. Respiratory: Denies shortness of breath. Gastrointestinal: Positive epigastric abdominal pain. Positive nausea and vomiting.  No diarrhea.  No constipation. Genitourinary: Negative for dysuria. Musculoskeletal: Negative for back pain. Skin: Negative for rash. Neurological: Negative for headaches, focal weakness or numbness.  10-point ROS otherwise negative.  ____________________________________________   PHYSICAL EXAM:  VITAL SIGNS: ED Triage Vitals  Enc Vitals Group     BP 08/12/20 0307 (!) 189/96     Pulse Rate 08/12/20 0307 88     Resp 08/12/20 0307 (!) 22     Temp 08/12/20 0307 98.1 F (36.7 C)  Temp Source 08/12/20 0307 Oral     SpO2 08/12/20 0307 99 %     Weight 08/12/20 0305 201 lb (91.2 kg)     Height 08/12/20 0305 5\' 5"  (1.651 m)   Constitutional: Alert and oriented. Patient with frequent dry heaving on arrival.  Eyes: Conjunctivae are normal.  Head: Atraumatic. Nose: No congestion/rhinnorhea. Mouth/Throat: Mucous membranes are moist.  Neck: No stridor.   Cardiovascular: Normal rate, regular rhythm. Good peripheral circulation. Grossly normal heart sounds.   Respiratory: Normal respiratory effort.  No retractions. Lungs CTAB. Gastrointestinal:  Soft with focal tenderness mainly in the epigastric regions. Mild RUQ tenderness. No rebound or guarding. No distention.  Musculoskeletal: No gross deformities of extremities. Neurologic:  Normal speech and language.  Skin:  Skin is warm, dry and intact. No rash noted.  ____________________________________________   LABS (all labs ordered are listed, but only abnormal results are displayed)  Labs Reviewed  COMPREHENSIVE METABOLIC PANEL - Abnormal; Notable for the following components:      Result Value   Glucose, Bld 165 (*)    All other components within normal limits  CBC - Abnormal; Notable for the following components:   WBC 12.4 (*)    All other components within normal limits  LIPASE, BLOOD  URINALYSIS, ROUTINE W REFLEX MICROSCOPIC  TROPONIN I (HIGH SENSITIVITY)  TROPONIN I (HIGH SENSITIVITY)   ____________________________________________  EKG   EKG Interpretation  Date/Time:  Wednesday August 12 2020 03:27:09 EDT Ventricular Rate:  88 PR Interval:  161 QRS Duration: 87 QT Interval:  385 QTC Calculation: 466 R Axis:   73 Text Interpretation: Sinus rhythm Consider left atrial enlargement Similar to prior tracing Confirmed by 11-09-1990 [phone]) on 08/12/2020 3:37:16 AM        ____________________________________________  RADIOLOGY  CT ABDOMEN PELVIS W CONTRAST  Result Date: 08/12/2020 CLINICAL DATA:  Nausea, vomiting, epigastric and right upper quadrant abdominal pain EXAM: CT ABDOMEN AND PELVIS WITH CONTRAST TECHNIQUE: Multidetector CT imaging of the abdomen and pelvis was performed using the standard protocol following bolus administration of intravenous contrast. CONTRAST:  60mL OMNIPAQUE IOHEXOL 350 MG/ML SOLN COMPARISON:  06/29/2020 FINDINGS: Lower chest: Mild bibasilar atelectasis. The visualized heart and pericardium are unremarkable. Hepatobiliary: Cholelithiasis without pericholecystic inflammatory change. Mild hepatic steatosis. No enhancing intrahepatic  mass. No intra or extrahepatic biliary ductal dilation. Pancreas: Unremarkable Spleen: Unremarkable Adrenals/Urinary Tract: Adrenal glands are unremarkable. Kidneys are normal, without renal calculi, focal lesion, or hydronephrosis. Bladder is unremarkable. Stomach/Bowel: Moderate sigmoid diverticulosis. Stomach, small bowel, and large bowel are otherwise unremarkable. No evidence of obstruction or focal inflammation. Appendix absent. No free intraperitoneal gas or fluid. Vascular/Lymphatic: Minimal aortoiliac atherosclerotic calcification. No aortic aneurysm. No pathologic adenopathy within the abdomen and pelvis. Reproductive: Status post hysterectomy. No adnexal masses. Other: Small broad-based fat containing umbilical hernia. Musculoskeletal: No acute bone abnormality. No lytic or blastic bone lesions. No acute bone abnormality. IMPRESSION: No interval change. No radiographic evidence of acute cardiopulmonary disease. No definite radiographic explanation for the patient's reported symptoms. Cholelithiasis. Mild hepatic steatosis. Moderate sigmoid diverticulosis. Aortic Atherosclerosis (ICD10-I70.0). Electronically Signed   By: 08/29/2020 MD   On: 08/12/2020 04:15    ____________________________________________   PROCEDURES  Procedure(s) performed:   Procedures  None  ____________________________________________   INITIAL IMPRESSION / ASSESSMENT AND PLAN / ED COURSE  Pertinent labs & imaging results that were available during my care of the patient were reviewed by me and considered in my medical decision making (see chart for details).  Patient presents to the emergency department with severe epigastric abdominal pain radiating to the back along with nausea and vomiting.  ED visit from June reviewed which seems similar.  Thought to be gallbladder related at that time.  Noncontrast CT obtained in the setting of contrast dye shortage at that time showing gallbladder sludge but no acute  cholecystitis findings.  Atypical ACS is a consideration.  Seems most consistent with gastritis.  No lower abdominal tenderness.  No peritoneal findings.  Plan for CT imaging with contrast along with repeat labs, EKG, troponin, and reassess.   05:10 AM  CT imaging reviewed with no change compared to her June CT.  Symptoms are improved with pain and nausea medication.  Patient is tolerating PO.  Called in additional pain medication after review of the record on a drug database.  Patient has follow-up appointment with surgery in the next week.  Unclear if this is symptomatic cholelithiasis versus acute gastritis.  No other findings on abdominal CT to explain symptoms.  Troponin and EKG are reassuring in terms of atypical ACS.  Discussed ED return precautions in detail. ____________________________________________  FINAL CLINICAL IMPRESSION(S) / ED DIAGNOSES  Final diagnoses:  Epigastric pain  Non-intractable vomiting with nausea, unspecified vomiting type     MEDICATIONS GIVEN DURING THIS VISIT:  Medications  ondansetron (ZOFRAN) injection 4 mg (4 mg Intravenous Given 08/12/20 0311)  pantoprazole (PROTONIX) injection 40 mg (40 mg Intravenous Given 08/12/20 0317)  morphine 4 MG/ML injection 4 mg (4 mg Intravenous Given 08/12/20 0317)  HYDROmorphone (DILAUDID) injection 1 mg (1 mg Intravenous Given 08/12/20 0342)  iohexol (OMNIPAQUE) 350 MG/ML injection 100 mL (75 mLs Intravenous Contrast Given 08/12/20 0400)  oxyCODONE-acetaminophen (PERCOCET/ROXICET) 5-325 MG per tablet 1 tablet (1 tablet Oral Given 08/12/20 0428)  alum & mag hydroxide-simeth (MAALOX/MYLANTA) 200-200-20 MG/5ML suspension 15 mL (15 mLs Oral Given 08/12/20 0428)  diphenhydrAMINE (BENADRYL) capsule 25 mg (25 mg Oral Given 08/12/20 0505)     NEW OUTPATIENT MEDICATIONS STARTED DURING THIS VISIT:  New Prescriptions   ONDANSETRON (ZOFRAN ODT) 4 MG DISINTEGRATING TABLET    Take 1 tablet (4 mg total) by mouth every 8 (eight) hours as  needed for nausea or vomiting.   OXYCODONE-ACETAMINOPHEN (PERCOCET/ROXICET) 5-325 MG TABLET    Take 1 tablet by mouth every 6 (six) hours as needed for severe pain.   PANTOPRAZOLE (PROTONIX) 40 MG TABLET    Take 1 tablet (40 mg total) by mouth daily.    Note:  This document was prepared using Dragon voice recognition software and may include unintentional dictation errors.  Alona Bene, MD, [facility] Emergency Medicine    Ashvin Adelson, Arlyss Repress, MD 08/12/20 [phone]

## 2020-08-12 NOTE — ED Triage Notes (Signed)
Patient here POV from Home with ABD Pain.  Patient states Pain is Epigastric in Origin and began at 0000 this AM and has been worsening since. Patient states it has been worsening since 0000 and patient also has Nausea/Emesis. Diarrhea more prevalent the past few days.  History of Cholelithiasis on June 28, 2020. Patient unable to see General Surgeon until August 19, 2020.   Ambulatory, Vomiting during Triage. A&Ox4. GCS 15.

## 2020-08-18 ENCOUNTER — Other Ambulatory Visit: Payer: Self-pay | Admitting: Surgery

## 2020-08-18 DIAGNOSIS — K8020 Calculus of gallbladder without cholecystitis without obstruction: Secondary | ICD-10-CM | POA: Diagnosis not present

## 2020-08-24 NOTE — Patient Instructions (Addendum)
DUE TO COVID-19 ONLY ONE VISITOR IS ALLOWED TO COME WITH YOU AND STAY IN THE WAITING ROOM ONLY DURING PRE OP AND PROCEDURE.   **NO VISITORS ARE ALLOWED IN THE SHORT STAY AREA OR RECOVERY ROOM!!**        Your procedure is scheduled on:  Wednesday, 09-02-20   Report to [facility] Main  Entrance   Report to admitting at 8:00 AM   Call this number if you have problems the morning of surgery [phone]   Do not eat food :After Midnight.   May have liquids until 7:00 AM day of surgery  CLEAR LIQUID DIET  Foods Allowed                                                                     Foods Excluded  Water, Black Coffee and tea, regular and decaf              liquids that you cannot  Plain Jell-O in any flavor  (No red)                                   see through such as: Fruit ices (not with fruit pulp)                                      milk, soups, orange juice              Iced Popsicles (No red)                                      All solid food                                   Apple juices Sports drinks like Gatorade (No red) Lightly seasoned clear broth or consume(fat free) Sugar, honey syrup    Complete one Ensure drink the morning of surgery at  7:00 AM the day of surgery.       The day of surgery:  Drink ONE (1) Pre-Surgery Clear Ensure the morning of surgery. Drink in one sitting. Do not sip.  This drink was given to you during your hospital  pre-op appointment visit. Nothing else to drink after completing the Pre-Surgery Clear Ensure          If you have questions, please contact your surgeon's office.     Oral Hygiene is also important to reduce your risk of infection.                                    Remember - BRUSH YOUR TEETH THE MORNING OF SURGERY WITH YOUR REGULAR TOOTHPASTE   Do NOT smoke after Midnight   Take these medicines the morning of surgery with A SIP OF WATER:  Fenofibrate, Pantoprazole, Oxycodone if needed  You may not have any metal on your body including hair pins, jewelry, and body piercing             Do not wear make-up, lotions, powders, perfumes or deodorant  Do not wear nail polish including gel and S&S, artificial/acrylic nails, or any other type of covering on natural nails including finger and toenails. If you have artificial nails, gel coating, etc. that needs to be removed by a nail salon please have this removed prior to surgery or surgery may need to be canceled/ delayed if the surgeon/ anesthesia feels like they are unable to be safely monitored.   Do not shave  48 hours prior to surgery.                Do not bring valuables to the hospital. [facility] IS NOT  RESPONSIBLE   FOR VALUABLES.   Contacts, dentures or bridgework may not be worn into surgery.   Patients discharged the day of surgery will not be allowed to drive home.  Special Instructions: Bring a copy of your healthcare power of attorney and living will documents         the day of surgery if you haven't scanned them in before.  Please read over the following fact sheets you were given: IF YOU HAVE QUESTIONS ABOUT YOUR PRE OP INSTRUCTIONS PLEASE CALL [phone]    - Preparing for Surgery Before surgery, you can play an important role.  Because skin is not sterile, your skin needs to be as free of germs as possible.  You can reduce the number of germs on your skin by washing with an antibacterial soap before surgery.   Do not shave (including legs and underarms) for at least 48 hours prior to the first CHG shower.  You may shave your face/neck.  Please follow these instructions carefully:  1.  Shower with antibacterial soap the night before surgery and the  morning of surgery.                       2.  Wash thoroughly, paying special attention to the area where your    surgery  will be performed.  3.  Thoroughly rinse your body with warm water from the neck down.             4.  Pat  yourself dry with a clean towel.             5.  Wear clean pajamas.             6.  Place clean sheets on your bed the night of your first shower and do not  sleep with pets.  Day of Surgery : Do not apply any lotions/deodorants the morning of surgery.  Please wear clean clothes to the hospital/surgery center.  FAILURE TO FOLLOW THESE INSTRUCTIONS MAY RESULT IN THE CANCELLATION OF YOUR SURGERY  PATIENT SIGNATURE_________________________________  NURSE SIGNATURE__________________________________  ________________________________________________________________________

## 2020-08-24 NOTE — Progress Notes (Signed)
COVID Vaccine Completed: Date COVID Vaccine completed: Has received booster: COVID vaccine manufacturer: Pfizer    [employer]   Date of COVID positive in last 90 days:  PCP - Marden Noble, MD Cardiologist -   Chest x-ray - 06-28-20 Epic EKG - 08-17-20 Epic Stress Test -  ECHO -  Cardiac Cath -  Pacemaker/ICD device last checked: Spinal Cord Stimulator:  Sleep Study - Yes,  CPAP -   Fasting Blood Sugar -  Checks Blood Sugar _____ times a day  Blood Thinner Instructions: Aspirin Instructions: Last Dose:  Activity level:  Can go up a flight of stairs and perform activities of daily living without stopping and without symptoms of chest pain or shortness of breath.   Able to exercise without symptoms  Unable to go up a flight of stairs without symptoms of      Anesthesia review:   Patient denies shortness of breath, fever, cough and chest pain at PAT appointment   Patient verbalized understanding of instructions that were given to them at the PAT appointment. Patient was also instructed that they will need to review over the PAT instructions again at home before surgery.

## 2020-08-25 ENCOUNTER — Encounter (HOSPITAL_COMMUNITY)
Admission: RE | Admit: 2020-08-25 | Discharge: 2020-08-25 | Disposition: A | Discharge status: Home / Self Care | Payer: BC Managed Care – PPO | Source: Ambulatory Visit | Attending: Anesthesiology | Admitting: Anesthesiology

## 2020-08-25 NOTE — Patient Instructions (Addendum)
DUE TO COVID-19 ONLY ONE VISITOR IS ALLOWED TO COME WITH YOU AND STAY IN THE WAITING ROOM ONLY DURING PRE OP AND PROCEDURE.   **NO VISITORS ARE ALLOWED IN THE SHORT STAY AREA OR RECOVERY ROOM!!**        Your procedure is scheduled on:  Wednesday, 09-02-20   Report to [facility] Main  Entrance   Report to admitting at 8:00 AM   Call this number if you have problems the morning of surgery [phone]   Do not eat food :After Midnight.   May have liquids until 7:00 AM day of surgery  CLEAR LIQUID DIET  Foods Allowed                                                                     Foods Excluded  Water, Black Coffee and tea, regular and decaf              liquids that you cannot  Plain Jell-O in any flavor  (No red)                                   see through such as: Fruit ices (not with fruit pulp)                                      milk, soups, orange juice              Iced Popsicles (No red)                                      All solid food                                   Apple juices Sports drinks like Gatorade (No red) Lightly seasoned clear broth or consume(fat free) Sugar, honey syrup    Complete one Ensure drink the morning of surgery at  7:00 AM the day of surgery.       The day of surgery:  Drink ONE (1) Pre-Surgery Clear Ensure the morning of surgery. Drink in one sitting. Do not sip.  This drink was given to you during your hospital  pre-op appointment visit. Nothing else to drink after completing the Pre-Surgery Clear Ensure          If you have questions, please contact your surgeon's office.     Oral Hygiene is also important to reduce your risk of infection.                                    Remember - BRUSH YOUR TEETH THE MORNING OF SURGERY WITH YOUR REGULAR TOOTHPASTE   Do NOT smoke after Midnight   Take these medicines the morning of surgery with A SIP OF WATER:  Fenofibrate, Pantoprazole, Oxycodone if needed  You may not have any metal on your body including hair pins, jewelry, and body piercing             Do not wear make-up, lotions, powders, perfumes or deodorant  Do not wear nail polish including gel and S&S, artificial/acrylic nails, or any other type of covering on natural nails including finger and toenails. If you have artificial nails, gel coating, etc. that needs to be removed by a nail salon please have this removed prior to surgery or surgery may need to be canceled/ delayed if the surgeon/ anesthesia feels like they are unable to be safely monitored.   Do not shave  48 hours prior to surgery.                Do not bring valuables to the hospital. [facility] IS NOT  RESPONSIBLE   FOR VALUABLES.   Contacts, dentures or bridgework may not be worn into surgery.   Patients discharged the day of surgery will not be allowed to drive home.  Special Instructions: Bring a copy of your healthcare power of attorney and living will documents  the day of surgery if you haven't scanned them in before.  Please read over the following fact sheets you were given: IF YOU HAVE QUESTIONS ABOUT YOUR PRE OP INSTRUCTIONS PLEASE CALL [phone]   [facility] - Preparing for Surgery Before surgery, you can play an important role.  Because skin is not sterile, your skin needs to be as free of germs as possible.  You can reduce the number of germs on your skin by washing with an antibacterial soap before surgery.   Do not shave (including legs and underarms) for at least 48 hours prior to the first CHG shower.   Please follow these instructions carefully:  1.  Shower with antibacterial soap the night before surgery and the  morning of surgery.                       2.  Wash thoroughly, paying special attention to the area where your  surgery  will be performed.  3.  Thoroughly rinse your body with warm water from the neck down.             4.  Pat yourself dry with a clean towel.              5.  Wear clean pajamas.             6.  Place clean sheets on your bed the night of your first shower and do not  sleep with pets.  Day of Surgery : Do not apply any lotions/deodorants the morning of surgery.  Please wear clean clothes to the hospital/surgery center.  FAILURE TO FOLLOW THESE INSTRUCTIONS MAY RESULT IN THE CANCELLATION OF YOUR SURGERY  PATIENT SIGNATURE_________________________________  NURSE SIGNATURE__________________________________  ________________________________________________________________________

## 2020-08-26 ENCOUNTER — Other Ambulatory Visit: Payer: Self-pay

## 2020-08-26 ENCOUNTER — Encounter (HOSPITAL_COMMUNITY)
Admission: RE | Admit: 2020-08-26 | Discharge: 2020-08-26 | Disposition: A | Discharge status: Home / Self Care | Payer: BC Managed Care – PPO | Source: Ambulatory Visit | Attending: Surgery | Admitting: Surgery

## 2020-08-26 ENCOUNTER — Encounter (HOSPITAL_COMMUNITY): Payer: Self-pay

## 2020-08-26 HISTORY — DX: Prediabetes: R73.03

## 2020-08-27 ENCOUNTER — Encounter (HOSPITAL_COMMUNITY)
Admission: RE | Admit: 2020-08-27 | Discharge: 2020-08-27 | Disposition: A | Discharge status: Home / Self Care | Payer: BC Managed Care – PPO | Source: Ambulatory Visit | Attending: Surgery | Admitting: Surgery

## 2020-08-27 DIAGNOSIS — Z01812 Encounter for preprocedural laboratory examination: Secondary | ICD-10-CM | POA: Insufficient documentation

## 2020-08-27 LAB — SURGICAL PCR SCREEN
MRSA, PCR: NEGATIVE
Staphylococcus aureus: NEGATIVE

## 2020-08-31 ENCOUNTER — Ambulatory Visit
Admission: RE | Admit: 2020-08-31 | Discharge: 2020-08-31 | Disposition: A | Discharge status: Home / Self Care | Payer: BC Managed Care – PPO | Source: Ambulatory Visit | Attending: Internal Medicine | Admitting: Internal Medicine

## 2020-08-31 DIAGNOSIS — Z720 Tobacco use: Secondary | ICD-10-CM

## 2020-08-31 DIAGNOSIS — E7800 Pure hypercholesterolemia, unspecified: Secondary | ICD-10-CM | POA: Diagnosis not present

## 2020-09-01 ENCOUNTER — Encounter (HOSPITAL_COMMUNITY): Payer: Self-pay | Admitting: Surgery

## 2020-09-01 ENCOUNTER — Encounter (HOSPITAL_COMMUNITY): Payer: Self-pay | Admitting: *Deleted

## 2020-09-01 ENCOUNTER — Observation Stay (HOSPITAL_COMMUNITY)
Admission: EM | Admit: 2020-09-01 | Discharge: 2020-09-02 | Disposition: A | Discharge status: Home / Self Care | Payer: BC Managed Care – PPO | Attending: Surgery | Admitting: Surgery

## 2020-09-01 ENCOUNTER — Observation Stay (HOSPITAL_COMMUNITY): Payer: BC Managed Care – PPO

## 2020-09-01 ENCOUNTER — Other Ambulatory Visit: Payer: Self-pay

## 2020-09-01 DIAGNOSIS — K8010 Calculus of gallbladder with chronic cholecystitis without obstruction: Secondary | ICD-10-CM | POA: Diagnosis not present

## 2020-09-01 DIAGNOSIS — K810 Acute cholecystitis: Secondary | ICD-10-CM | POA: Diagnosis not present

## 2020-09-01 DIAGNOSIS — R1011 Right upper quadrant pain: Secondary | ICD-10-CM | POA: Diagnosis not present

## 2020-09-01 DIAGNOSIS — F17210 Nicotine dependence, cigarettes, uncomplicated: Secondary | ICD-10-CM | POA: Insufficient documentation

## 2020-09-01 DIAGNOSIS — Z20822 Contact with and (suspected) exposure to covid-19: Secondary | ICD-10-CM | POA: Diagnosis not present

## 2020-09-01 DIAGNOSIS — K8020 Calculus of gallbladder without cholecystitis without obstruction: Secondary | ICD-10-CM | POA: Diagnosis not present

## 2020-09-01 LAB — URINALYSIS, ROUTINE W REFLEX MICROSCOPIC
Bilirubin Urine: NEGATIVE
Glucose, UA: NEGATIVE mg/dL
Hgb urine dipstick: NEGATIVE
Ketones, ur: NEGATIVE mg/dL
Nitrite: NEGATIVE
Protein, ur: NEGATIVE mg/dL
Specific Gravity, Urine: 1.003 — ABNORMAL LOW (ref 1.005–1.030)
pH: 8 (ref 5.0–8.0)

## 2020-09-01 LAB — CBC
HCT: 39.5 % (ref 36.0–46.0)
Hemoglobin: 13 g/dL (ref 12.0–15.0)
MCH: 31 pg (ref 26.0–34.0)
MCHC: 32.9 g/dL (ref 30.0–36.0)
MCV: 94.3 fL (ref 80.0–100.0)
Platelets: 250 10*3/uL (ref 150–400)
RBC: 4.19 MIL/uL (ref 3.87–5.11)
RDW: 13.2 % (ref 11.5–15.5)
WBC: 10.7 10*3/uL — ABNORMAL HIGH (ref 4.0–10.5)
nRBC: 0 % (ref 0.0–0.2)

## 2020-09-01 LAB — COMPREHENSIVE METABOLIC PANEL
ALT: 34 U/L (ref 0–44)
AST: 35 U/L (ref 15–41)
Albumin: 4.2 g/dL (ref 3.5–5.0)
Alkaline Phosphatase: 70 U/L (ref 38–126)
Anion gap: 10 (ref 5–15)
BUN: 15 mg/dL (ref 8–23)
CO2: 23 mmol/L (ref 22–32)
Calcium: 9.2 mg/dL (ref 8.9–10.3)
Chloride: 107 mmol/L (ref 98–111)
Creatinine, Ser: 0.7 mg/dL (ref 0.44–1.00)
GFR, Estimated: >60 mL/min (ref >60)
Glucose, Bld: 110 mg/dL — ABNORMAL HIGH (ref 70–99)
Potassium: 3.8 mmol/L (ref 3.5–5.1)
Sodium: 140 mmol/L (ref 135–145)
Total Bilirubin: 0.7 mg/dL (ref 0.3–1.2)
Total Protein: 7.7 g/dL (ref 6.5–8.1)

## 2020-09-01 LAB — RESP PANEL BY RT-PCR (FLU A&B, COVID) ARPGX2
Influenza A by PCR: NEGATIVE
Influenza B by PCR: NEGATIVE
SARS Coronavirus 2 by RT PCR: NEGATIVE

## 2020-09-01 LAB — LIPASE, BLOOD: Lipase: 26 U/L (ref 11–51)

## 2020-09-01 MED ORDER — DIPHENHYDRAMINE HCL 50 MG/ML IJ SOLN
12.5000 mg | Freq: Four times a day (QID) | INTRAMUSCULAR | Status: DC | PRN
  Administered 2020-09-01 – 2020-09-02 (×2): 12.5 mg via INTRAVENOUS
  Filled 2020-09-01 (×2): qty 1

## 2020-09-01 MED ORDER — ENOXAPARIN SODIUM 40 MG/0.4ML IJ SOSY
40.0000 mg | PREFILLED_SYRINGE | INTRAMUSCULAR | Status: DC
  Administered 2020-09-01: 40 mg via SUBCUTANEOUS
  Filled 2020-09-01: qty 0.4

## 2020-09-01 MED ORDER — HYDROMORPHONE HCL 1 MG/ML IJ SOLN
0.5000 mg | INTRAMUSCULAR | Status: DC | PRN
  Administered 2020-09-01 – 2020-09-02 (×6): 1 mg via INTRAVENOUS
  Filled 2020-09-01 (×6): qty 1

## 2020-09-01 MED ORDER — DIPHENHYDRAMINE HCL 12.5 MG/5ML PO ELIX: 12.5000 mg | ORAL_SOLUTION | Freq: Four times a day (QID) | ORAL | Status: DC | PRN

## 2020-09-01 MED ORDER — ONDANSETRON HCL 4 MG/2ML IJ SOLN
4.0000 mg | Freq: Four times a day (QID) | INTRAMUSCULAR | Status: DC | PRN
  Administered 2020-09-01: 4 mg via INTRAVENOUS
  Filled 2020-09-01: qty 2

## 2020-09-01 MED ORDER — CEFTRIAXONE SODIUM 2 G IJ SOLR
2.0000 g | INTRAMUSCULAR | Status: DC
  Administered 2020-09-01: 2 g via INTRAVENOUS
  Filled 2020-09-01 (×2): qty 20

## 2020-09-01 MED ORDER — POTASSIUM CHLORIDE IN NACL 20-0.9 MEQ/L-% IV SOLN
INTRAVENOUS | Status: DC
  Filled 2020-09-01 (×3): qty 1000

## 2020-09-01 MED ORDER — OXYCODONE HCL 5 MG PO TABS
5.0000 mg | ORAL_TABLET | ORAL | Status: DC | PRN
  Administered 2020-09-01: 5 mg via ORAL
  Administered 2020-09-01 – 2020-09-02 (×2): 10 mg via ORAL
  Filled 2020-09-01: qty 2
  Filled 2020-09-01: qty 1
  Filled 2020-09-01: qty 2

## 2020-09-01 MED ORDER — CLONAZEPAM 1 MG PO TABS
1.0000 mg | ORAL_TABLET | Freq: Every day | ORAL | Status: DC
  Administered 2020-09-01: 1 mg via ORAL
  Filled 2020-09-01: qty 1

## 2020-09-01 MED ORDER — METOPROLOL TARTRATE 5 MG/5ML IV SOLN
5.0000 mg | Freq: Four times a day (QID) | INTRAVENOUS | Status: DC | PRN
Start: 2020-09-01 — End: 2020-09-02

## 2020-09-01 MED ORDER — ONDANSETRON 4 MG PO TBDP: 4.0000 mg | ORAL_TABLET | Freq: Four times a day (QID) | ORAL | Status: DC | PRN

## 2020-09-01 MED ORDER — BUPROPION HCL ER (SR) 150 MG PO TB12
150.0000 mg | ORAL_TABLET | Freq: Every day | ORAL | Status: DC
  Administered 2020-09-02: 150 mg via ORAL
  Filled 2020-09-01: qty 1

## 2020-09-01 MED ORDER — ACETAMINOPHEN 650 MG RE SUPP: 650.0000 mg | Freq: Four times a day (QID) | RECTAL | Status: DC | PRN

## 2020-09-01 MED ORDER — ACETAMINOPHEN 325 MG PO TABS
650.0000 mg | ORAL_TABLET | Freq: Four times a day (QID) | ORAL | Status: DC | PRN
  Administered 2020-09-01: 650 mg via ORAL
  Filled 2020-09-01: qty 2

## 2020-09-01 NOTE — ED Triage Notes (Signed)
Pt is to have gallbladder removed tomorrow. She began experiencing severe pain in RUQ last night.

## 2020-09-01 NOTE — ED Notes (Signed)
Pt reports itching on her feet after receiving previous pain medication. Will give IV benadryl

## 2020-09-01 NOTE — H&P (Signed)
Admission Note  Norma [name] 07/19/1960  [id number].     Chief Complaint/Reason for Consult: Symptomatic cholelithiasis HPI:  This is a 60 year old female who presents with symptomatic gallstones. She has had to present to the emergency room twice previously for severe epigastric and right upper quadrant abdominal pain hurting through to the back. Again, the pain has been severe. She had nausea and vomiting as well. She does feel better today and has much less discomfort. She denies jaundice. She has had loose bowel movements. She underwent a CT scan and ultrasound. This confirmed gallstones. Liver function tests were normal and the bile duct was nondilated. Patient was seen in the office 08/18/20 and scheduled for surgery electively tomorrow. She presented to ED today with severe pain that radiates to RUE and is worse with trying to take a deep breath. She reports nausea and bilious emesis. She denies fever, chills, urinary symptoms.   ROS: Review of Systems  Constitutional:  Negative for chills and fever.  Respiratory:  Positive for shortness of breath. Negative for wheezing.   Cardiovascular:  Positive for chest pain. Negative for palpitations.  Gastrointestinal:  Positive for abdominal pain, nausea and vomiting. Negative for blood in stool, constipation, diarrhea and melena.  Genitourinary:  Negative for dysuria, frequency and urgency.  Musculoskeletal:  Positive for back pain.   Family History  Problem Relation Age of Onset   Cancer Mother    Cancer Father    Breast cancer Neg Hx     Past Medical History:  Diagnosis Date   Anxiety    Depression    PONV (postoperative nausea and vomiting)    Pre-diabetes    Snores    Volar retinacular ganglion     Past Surgical History:  Procedure Laterality Date   ABDOMINAL HYSTERECTOMY     ANTERIOR CERVICAL DECOMP/DISCECTOMY FUSION N/A 04/10/2013   Procedure: ANTERIOR CERVICAL DECOMPRESSION/DISCECTOMY FUSION 1 LEVEL;  Surgeon:  Emilee Hero, MD;  Location: [facility];  Service: Orthopedics;  Laterality: N/A;  Anterior cervical decompression fusion, cervical 6-7 with instrumentation and allograft   APPENDECTOMY     BARTHOLIN CYST MARSUPIALIZATION N/A 03/07/2013   Procedure: BARTHOLIN CYST MARSUPIALIZATION;  Surgeon: Sharon Seller, DO;  Location: [facility];  Service: Gynecology;  Laterality: N/A;   COLONOSCOPY     COLONOSCOPY WITH PROPOFOL N/A 06/15/2016   Procedure: COLONOSCOPY WITH PROPOFOL;  Surgeon: Charolett Bumpers, MD;  Location: [facility];  Service: Endoscopy;  Laterality: N/A;   INCISION AND DRAINAGE HIP     RT   REDUCTION MAMMAPLASTY Bilateral 1978   SHOULDER ARTHROSCOPY     RT   WRIST GANGLION EXCISION     X2 RT WRIST, X1 LT WRIST    Social History:  reports that she has been smoking cigarettes. She has a 11.50 pack-year smoking history. She has never used smokeless tobacco. She reports current alcohol use. She reports that she does not use drugs.  Allergies:  Allergies  Allergen Reactions   Codeine Itching    In high doses   Other Itching and Rash    Chlorhexidine wipes - rash all over body    (Not in a hospital admission)   Blood pressure 133/61, pulse 77, temperature 98 F (36.7 C), temperature source Oral, resp. rate 18, SpO2 95 %. Physical Exam:  General: pleasant, WD, obese female who is in obvious discomfort HEENT: head is normocephalic, atraumatic.  Sclera are anicteric.  PERRL.  Ears and nose without any masses or  lesions.  Mouth is pink and moist Heart: regular, rate, and rhythm.  Normal s1,s2. No obvious murmurs, gallops, or rubs noted.  Palpable radial and pedal pulses bilaterally Lungs: CTAB, no wheezes, rhonchi, or rales noted.  Respiratory effort nonlabored Abd: soft, ttp in RUQ and epigastrium, ND, +BS, no masses, hernias, or organomegaly MS: all 4 extremities are symmetrical with no cyanosis, clubbing, or edema. Skin: warm and dry with no masses, lesions, or  rashes Neuro: Cranial nerves 2-12 grossly intact, sensation is normal throughout Psych: A&Ox3 with an appropriate affect.   No results found for this or any previous visit (from the past 48 hour(s)). CT CARDIAC SCORING ([facility] LOCATIONS ONLY)  Result Date: 08/31/2020 CLINICAL DATA:  60 year old white female with history of tobacco abuse. Elevated cholesterol. Current smoker. EXAM: CT CARDIAC CORONARY ARTERY CALCIUM SCORE TECHNIQUE: Non-contrast imaging through the heart was performed using prospective ECG gating. Image post processing was performed on an independent workstation, allowing for quantitative analysis of the heart and coronary arteries. Note that this exam targets the heart and the chest was not imaged in its entirety. COMPARISON:  Chest CT 05/27/2020 and 02/13/2018 FINDINGS: CORONARY CALCIUM SCORES: Left Main: 0 LAD: 12.8 LCx: 0 RCA: 0.194 Total Agatston Score: 13 MESA database percentile: 71 AORTA MEASUREMENTS: Ascending Aorta: 37 mm Descending Aorta: 25 mm OTHER FINDINGS: Small amount of calcium involving the aortic root. Heart size is normal. Visualized mediastinal structures are normal. Normal appearance of the upper abdominal structures. Focal pleural nodularity along the medial left lower lobe on sequence 9 image 30 has minimally changed since 2020. Small pleural-based nodule in left lower lobe on image 39, sequence 9 is stable since 2020. No acute bone abnormality. IMPRESSION: Coronary calcium score is 13 and this is at percentile 71 for patients of the same age, gender and ethnicity. Aortic Atherosclerosis (ICD10-I70.0). Electronically Signed   By: Richarda Overlie M.D.   On: 08/31/2020 10:47      Assessment/Plan Symptomatic cholelithiasis - proceed with planned OR tomorrow AM - will admit to observation for pain control and IV fluids - check labs and RUQ [place] to evaluate for cholecystitis or choledocholithiasis as well - will start empiric rocephin given worsened pain  - COVID swab  ordered  FEN: CLD, IVF VTE: LMWH ID: rocephin  Depression/Anxiety PONV Pre-diabetes Obesity - BMI 36.61  Juliet Rude, [facility] 09/01/2020, 3:34 PM Please see Amion for pager number during day hours 7:00am-4:30pm

## 2020-09-01 NOTE — ED Provider Notes (Signed)
[facility]-EMERGENCY DEPT Provider Note   CSN: [id number] Arrival date & time: 09/01/20  1411     History Chief Complaint  Patient presents with   Abdominal Pain    Norma [name] is a 60 y.o. female.  She has been having symptomatic cholelithiasis for the last 2 months.  She was was to get surgery tomorrow been experiencing increased pain since last night.  She rates it as 9 out of 10.  Difficult [place] other topics.  She called general surgery and came to the emergency department.  No fevers or chills.  Moving her bowels normal.  The history is provided by the patient.  Abdominal Pain Pain location:  RUQ Pain quality: aching and cramping   Pain radiates to:  Back Pain severity:  Severe Onset quality:  Gradual Duration:  2 days Timing:  Constant Progression:  Unchanged Chronicity:  Recurrent Context: not trauma   Relieved by:  Nothing Worsened by:  Deep breathing and movement Ineffective treatments:  None tried Associated symptoms: no chest pain, no cough, no diarrhea, no dysuria, no fever, no shortness of breath, no sore throat and no vomiting       Past Medical History:  Diagnosis Date   Anxiety    Depression    PONV (postoperative nausea and vomiting)    Pre-diabetes    Snores    Volar retinacular ganglion     Patient Active Problem List   Diagnosis Date Noted   Symptomatic cholelithiasis 09/01/2020   Snoring 08/06/2019   Witnessed episode of apnea 08/06/2019   Excessive sleepiness 08/06/2019   Fatigue 08/06/2019   Insomnia 08/06/2019   Radiculopathy 04/10/2013    Past Surgical History:  Procedure Laterality Date   ABDOMINAL HYSTERECTOMY     ANTERIOR CERVICAL DECOMP/DISCECTOMY FUSION N/A 04/10/2013   Procedure: ANTERIOR CERVICAL DECOMPRESSION/DISCECTOMY FUSION 1 LEVEL;  Surgeon: Emilee Hero, MD;  Location: [facility];  Service: Orthopedics;  Laterality: N/A;  Anterior cervical decompression fusion, cervical 6-7 with  instrumentation and allograft   APPENDECTOMY     BARTHOLIN CYST MARSUPIALIZATION N/A 03/07/2013   Procedure: BARTHOLIN CYST MARSUPIALIZATION;  Surgeon: Sharon Seller, DO;  Location: [facility];  Service: Gynecology;  Laterality: N/A;   COLONOSCOPY     COLONOSCOPY WITH PROPOFOL N/A 06/15/2016   Procedure: COLONOSCOPY WITH PROPOFOL;  Surgeon: Charolett Bumpers, MD;  Location: [facility];  Service: Endoscopy;  Laterality: N/A;   INCISION AND DRAINAGE HIP     RT   REDUCTION MAMMAPLASTY Bilateral 1978   SHOULDER ARTHROSCOPY     RT   WRIST GANGLION EXCISION     X2 RT WRIST, X1 LT WRIST     OB History     Gravida  2   Para  2   Term      Preterm      AB      Living  2      SAB      IAB      Ectopic      Multiple      Live Births              Family History  Problem Relation Age of Onset   Cancer Mother    Cancer Father    Breast cancer Neg Hx     Social History   Tobacco Use   Smoking status: Every Day    Packs/day: 0.25    Years: 46.00    Pack years: 11.50    Types:  Cigarettes   Smokeless tobacco: Never  Vaping Use   Vaping Use: Never used  Substance Use Topics   Alcohol use: Yes    Comment: SOCIAL   Drug use: No    Home Medications Prior to Admission medications   Medication Sig Start Date End Date Taking? Authorizing Provider  Ascorbic Acid (VITAMIN C PO) Take 1 tablet by mouth daily.    [provider]  buPROPion (WELLBUTRIN SR) 150 MG 12 hr tablet Take 150 mg by mouth daily.    [provider]  citalopram (CELEXA) 40 MG tablet Take 40 mg by mouth daily.    [provider]  clonazePAM (KLONOPIN) 1 MG tablet Take 1 mg by mouth at bedtime.     [provider]  fenofibrate 160 MG tablet Take 160 mg by mouth every evening.    [provider]  ibuprofen (ADVIL,MOTRIN) 800 MG tablet Take 1 tablet (800 mg total) by mouth 3 (three) times daily. Patient taking differently: Take 800 mg by mouth daily as  needed for moderate pain. 04/15/16   Long, Arlyss RepressJoshua G, MD  Multiple Vitamin (MULTIVITAMIN) tablet Take 1 tablet by mouth daily.    [provider]  ondansetron (ZOFRAN ODT) 4 MG disintegrating tablet Take 1 tablet (4 mg total) by mouth every 8 (eight) hours as needed for nausea or vomiting. Patient not taking: No sig reported 08/12/20   Long, Arlyss RepressJoshua G, MD  oxyCODONE-acetaminophen (PERCOCET/ROXICET) 5-325 MG tablet Take 1 tablet by mouth every 6 (six) hours as needed for severe pain. Patient not taking: No sig reported 08/12/20   Long, Arlyss RepressJoshua G, MD  pantoprazole (PROTONIX) 40 MG tablet Take 1 tablet (40 mg total) by mouth daily. Patient not taking: No sig reported 08/12/20 09/11/20  Long, Arlyss RepressJoshua G, MD    Allergies    Codeine and Other  Review of Systems   Review of Systems  Constitutional:  Negative for fever.  HENT:  Negative for sore throat.   Eyes:  Negative for visual disturbance.  Respiratory:  Negative for cough and shortness of breath.   Cardiovascular:  Negative for chest pain.  Gastrointestinal:  Positive for abdominal pain. Negative for diarrhea and vomiting.  Genitourinary:  Negative for dysuria.  Musculoskeletal:  Negative for neck pain.  Skin:  Negative for rash.  Neurological:  Negative for headaches.   Physical Exam Updated Vital Signs BP 133/61   Pulse 77   Temp 98 F (36.7 C) (Oral)   Resp 18   SpO2 95%   Physical Exam Vitals and nursing note reviewed.  Constitutional:      General: She is not in acute distress.    Appearance: Normal appearance. She is well-developed.  HENT:     Head: Normocephalic and atraumatic.  Eyes:     Conjunctiva/sclera: Conjunctivae normal.  Cardiovascular:     Rate and Rhythm: Normal rate and regular rhythm.     Heart sounds: No murmur heard. Pulmonary:     Effort: Pulmonary effort is normal. No respiratory distress.     Breath sounds: Normal breath sounds.  Abdominal:     Palpations: Abdomen is soft.     Tenderness:  There is abdominal tenderness in the right upper quadrant.  Musculoskeletal:        General: Normal range of motion.     Cervical back: Neck supple.     Right lower leg: No edema.     Left lower leg: No edema.  Skin:    General: Skin is warm  and dry.  Neurological:     General: No focal deficit present.     Mental Status: She is alert.    ED Results / Procedures / Treatments   Labs (all labs ordered are listed, but only abnormal results are displayed) Labs Reviewed  COMPREHENSIVE METABOLIC PANEL - Abnormal; Notable for the following components:      Result Value   Glucose, Bld 110 (*)    All other components within normal limits  CBC - Abnormal; Notable for the following components:   WBC 10.7 (*)    All other components within normal limits  URINALYSIS, ROUTINE W REFLEX MICROSCOPIC - Abnormal; Notable for the following components:   Specific Gravity, Urine 1.003 (*)    Leukocytes,Ua SMALL (*)    Bacteria, UA RARE (*)    All other components within normal limits  COMPREHENSIVE METABOLIC PANEL - Abnormal; Notable for the following components:   Glucose, Bld 107 (*)    Calcium 8.6 (*)    AST 72 (*)    ALT 71 (*)    All other components within normal limits  RESP PANEL BY RT-PCR (FLU A&B, COVID) ARPGX2  LIPASE, BLOOD  CBC  SURGICAL PATHOLOGY    EKG None  Radiology US Abdomen Limited RUQ (LIVER/GB)  Result Date: 09/01/2020 CLINICAL DATA:  Right upper quadrant pain. EXAM: ULTRASOUND ABDOMEN LIMITED RIGHT UPPER QUADRANT COMPARISON:  CT August 12, 2020. FINDINGS: Gallbladder: Cholelithiasis measuring up to 2 cm. Wall thickness is upper limits of normal at 2.9 mm. No pericholecystic fluid. Adenomyomatosis of the gallbladder wall. Sonographic Murphy sign noted by sonographer. Common bile duct: Diameter: 9 mm Liver: No focal lesion identified. Diffusely increased parenchymal echogenicity. Portal vein is patent on color Doppler imaging with normal direction of blood flow towards the  liver. Other: None. IMPRESSION: 1. Cholelithiasis with borderline gallbladder wall thickening and positive sonographic Murphy sign. Findings which are suspicious for acute cholecystitis. 2. Dilated common bile duct measuring 9 mm, no choledocholithiasis visualized. Correlation with serum bilirubin suggested to evaluate for obstruction. If clinical concern for biliary ductal obstruction this could be further evaluated with MRCP. Electronically Signed   By: Maudry Mayhew MD   On: 09/01/2020 17:38    Procedures Procedures   Medications Ordered in ED Medications  enoxaparin (LOVENOX) injection 40 mg (has no administration in time range)  0.9 % NaCl with KCl 20 mEq/ L  infusion (has no administration in time range)  cefTRIAXone (ROCEPHIN) 2 g in sodium chloride 0.9 % 100 mL IVPB (has no administration in time range)  acetaminophen (TYLENOL) tablet 650 mg (has no administration in time range)    Or  acetaminophen (TYLENOL) suppository 650 mg (has no administration in time range)  oxyCODONE (Oxy IR/ROXICODONE) immediate release tablet 5-10 mg (has no administration in time range)  HYDROmorphone (DILAUDID) injection 0.5-1 mg (has no administration in time range)  diphenhydrAMINE (BENADRYL) 12.5 MG/5ML elixir 12.5 mg (has no administration in time range)    Or  diphenhydrAMINE (BENADRYL) injection 12.5 mg (has no administration in time range)  ondansetron (ZOFRAN-ODT) disintegrating tablet 4 mg (has no administration in time range)    Or  ondansetron (ZOFRAN) injection 4 mg (has no administration in time range)  metoprolol tartrate (LOPRESSOR) injection 5 mg (has no administration in time range)  buPROPion (WELLBUTRIN SR) 12 hr tablet 150 mg (has no administration in time range)  clonazePAM (KLONOPIN) tablet 1 mg (has no administration in time range)    ED Course  I have reviewed  the triage vital signs and the nursing notes.  Pertinent labs & imaging results that were available during my care of  the patient were reviewed by me and considered in my medical decision making (see chart for details).    MDM Rules/Calculators/A&P                          This patient complains of upper abdominal pain; this involves an extensive number of treatment Options and is a complaint that carries with it a high risk of complications and Morbidity. The differential includes cholelithiasis, cholecystitis, peptic ulcer disease, pancreatitis  I ordered, reviewed and interpreted labs, which included CBC with normal white count normal hemoglobin, chemistries normal, LFTs mildly elevated, urinalysis without signs of infection, COVID and flu testing negative  I ordered imaging studies which included right upper quadrant ultrasound and I independently    visualized and interpreted imaging which showed probable acute cholecystitis Previous records obtained and reviewed in epic including prior surgical evaluation I consulted general surgery and discussed lab and imaging findings  Critical Interventions: None  After the interventions stated above, I reevaluated the patient and found patient's pain to be improving.  She will need to be admitted to the hospital for cholecystectomy.  Surgery admitting.   Final Clinical Impression(s) / ED Diagnoses Final diagnoses:  Cholelithiasis    Rx / DC Orders ED Discharge Orders     None        Terrilee Files, MD 09/02/20 1116

## 2020-09-02 ENCOUNTER — Encounter (HOSPITAL_COMMUNITY)
Admission: EM | Disposition: A | Discharge status: Home / Self Care | Payer: Self-pay | Source: Home / Self Care | Attending: Emergency Medicine

## 2020-09-02 ENCOUNTER — Observation Stay (HOSPITAL_COMMUNITY): Payer: BC Managed Care – PPO | Admitting: Certified Registered Nurse Anesthetist

## 2020-09-02 ENCOUNTER — Ambulatory Visit (HOSPITAL_COMMUNITY): Admission: RE | Admit: 2020-09-02 | Payer: BC Managed Care – PPO | Source: Home / Self Care | Admitting: Surgery

## 2020-09-02 ENCOUNTER — Encounter (HOSPITAL_COMMUNITY): Payer: Self-pay

## 2020-09-02 DIAGNOSIS — K8010 Calculus of gallbladder with chronic cholecystitis without obstruction: Secondary | ICD-10-CM | POA: Diagnosis not present

## 2020-09-02 DIAGNOSIS — G4700 Insomnia, unspecified: Secondary | ICD-10-CM | POA: Diagnosis not present

## 2020-09-02 DIAGNOSIS — F418 Other specified anxiety disorders: Secondary | ICD-10-CM | POA: Diagnosis not present

## 2020-09-02 DIAGNOSIS — R7303 Prediabetes: Secondary | ICD-10-CM | POA: Diagnosis not present

## 2020-09-02 DIAGNOSIS — K8000 Calculus of gallbladder with acute cholecystitis without obstruction: Secondary | ICD-10-CM | POA: Diagnosis not present

## 2020-09-02 HISTORY — PX: CHOLECYSTECTOMY: SHX55

## 2020-09-02 LAB — COMPREHENSIVE METABOLIC PANEL
ALT: 71 U/L — ABNORMAL HIGH (ref 0–44)
AST: 72 U/L — ABNORMAL HIGH (ref 15–41)
Albumin: 3.6 g/dL (ref 3.5–5.0)
Alkaline Phosphatase: 89 U/L (ref 38–126)
Anion gap: 7 (ref 5–15)
BUN: 8 mg/dL (ref 8–23)
CO2: 25 mmol/L (ref 22–32)
Calcium: 8.6 mg/dL — ABNORMAL LOW (ref 8.9–10.3)
Chloride: 109 mmol/L (ref 98–111)
Creatinine, Ser: 0.53 mg/dL (ref 0.44–1.00)
GFR, Estimated: >60 mL/min (ref >60)
Glucose, Bld: 107 mg/dL — ABNORMAL HIGH (ref 70–99)
Potassium: 4.3 mmol/L (ref 3.5–5.1)
Sodium: 141 mmol/L (ref 135–145)
Total Bilirubin: 0.6 mg/dL (ref 0.3–1.2)
Total Protein: 6.7 g/dL (ref 6.5–8.1)

## 2020-09-02 LAB — CBC
HCT: 39.3 % (ref 36.0–46.0)
Hemoglobin: 12.9 g/dL (ref 12.0–15.0)
MCH: 31.8 pg (ref 26.0–34.0)
MCHC: 32.8 g/dL (ref 30.0–36.0)
MCV: 96.8 fL (ref 80.0–100.0)
Platelets: 213 10*3/uL (ref 150–400)
RBC: 4.06 MIL/uL (ref 3.87–5.11)
RDW: 13.2 % (ref 11.5–15.5)
WBC: 6.5 10*3/uL (ref 4.0–10.5)
nRBC: 0 % (ref 0.0–0.2)

## 2020-09-02 SURGERY — LAPAROSCOPIC CHOLECYSTECTOMY
Anesthesia: General | Site: Abdomen

## 2020-09-02 MED ORDER — FENTANYL CITRATE (PF) 100 MCG/2ML IJ SOLN
INTRAMUSCULAR | Status: AC
  Filled 2020-09-02: qty 2

## 2020-09-02 MED ORDER — FENTANYL CITRATE (PF) 100 MCG/2ML IJ SOLN
25.0000 ug | INTRAMUSCULAR | Status: DC | PRN
  Administered 2020-09-02 (×2): 50 ug via INTRAVENOUS

## 2020-09-02 MED ORDER — BUPIVACAINE HCL (PF) 0.5 % IJ SOLN
INTRAMUSCULAR | Status: DC | PRN
  Administered 2020-09-02: 20 mL

## 2020-09-02 MED ORDER — DEXAMETHASONE SODIUM PHOSPHATE 10 MG/ML IJ SOLN
INTRAMUSCULAR | Status: DC | PRN
  Administered 2020-09-02: 10 mg via INTRAVENOUS

## 2020-09-02 MED ORDER — LACTATED RINGERS IV SOLN: INTRAVENOUS | Status: DC

## 2020-09-02 MED ORDER — MIDAZOLAM HCL 2 MG/2ML IJ SOLN
INTRAMUSCULAR | Status: DC | PRN
  Administered 2020-09-02: 2 mg via INTRAVENOUS

## 2020-09-02 MED ORDER — CHLORHEXIDINE GLUCONATE CLOTH 2 % EX PADS: 6.0000 | MEDICATED_PAD | Freq: Once | CUTANEOUS | Status: DC

## 2020-09-02 MED ORDER — CEFAZOLIN SODIUM-DEXTROSE 2-4 GM/100ML-% IV SOLN
2.0000 g | INTRAVENOUS | Status: AC
  Administered 2020-09-02: 2 g via INTRAVENOUS
  Filled 2020-09-02: qty 100

## 2020-09-02 MED ORDER — ONDANSETRON HCL 4 MG/2ML IJ SOLN
INTRAMUSCULAR | Status: DC | PRN
  Administered 2020-09-02: 4 mg via INTRAVENOUS

## 2020-09-02 MED ORDER — FENTANYL CITRATE (PF) 100 MCG/2ML IJ SOLN
INTRAMUSCULAR | Status: DC | PRN
  Administered 2020-09-02: 100 ug via INTRAVENOUS
  Administered 2020-09-02 (×3): 50 ug via INTRAVENOUS

## 2020-09-02 MED ORDER — OXYCODONE HCL 5 MG PO TABS: 5.0000 mg | ORAL_TABLET | Freq: Four times a day (QID) | ORAL | 0 refills | Status: DC | PRN

## 2020-09-02 MED ORDER — ACETAMINOPHEN 325 MG PO TABS: 650.0000 mg | ORAL_TABLET | Freq: Four times a day (QID) | ORAL | Status: DC | PRN

## 2020-09-02 MED ORDER — ROCURONIUM BROMIDE 10 MG/ML (PF) SYRINGE
PREFILLED_SYRINGE | INTRAVENOUS | Status: DC | PRN
  Administered 2020-09-02: 80 mg via INTRAVENOUS

## 2020-09-02 MED ORDER — BUPIVACAINE HCL (PF) 0.5 % IJ SOLN
INTRAMUSCULAR | Status: AC
  Filled 2020-09-02: qty 60

## 2020-09-02 MED ORDER — SUGAMMADEX SODIUM 200 MG/2ML IV SOLN
INTRAVENOUS | Status: DC | PRN
  Administered 2020-09-02: 200 mg via INTRAVENOUS

## 2020-09-02 MED ORDER — ENSURE PRE-SURGERY PO LIQD
296.0000 mL | Freq: Once | ORAL | Status: DC
  Filled 2020-09-02: qty 296

## 2020-09-02 MED ORDER — ORAL CARE MOUTH RINSE: 15.0000 mL | Freq: Once | OROMUCOSAL | Status: DC

## 2020-09-02 MED ORDER — CHLORHEXIDINE GLUCONATE 0.12 % MT SOLN: 15.0000 mL | Freq: Once | OROMUCOSAL | Status: DC

## 2020-09-02 MED ORDER — 0.9 % SODIUM CHLORIDE (POUR BTL) OPTIME
TOPICAL | Status: DC | PRN
  Administered 2020-09-02: 1000 mL

## 2020-09-02 MED ORDER — LACTATED RINGERS IR SOLN
Status: DC | PRN
  Administered 2020-09-02: 1000 mL

## 2020-09-02 MED ORDER — ACETAMINOPHEN 500 MG PO TABS
1000.0000 mg | ORAL_TABLET | Freq: Once | ORAL | Status: AC
  Administered 2020-09-02: 1000 mg via ORAL

## 2020-09-02 MED ORDER — SCOPOLAMINE 1 MG/3DAYS TD PT72
MEDICATED_PATCH | TRANSDERMAL | Status: DC | PRN
  Administered 2020-09-02: 1 via TRANSDERMAL

## 2020-09-02 MED ORDER — LIDOCAINE 2% (20 MG/ML) 5 ML SYRINGE
INTRAMUSCULAR | Status: DC | PRN
  Administered 2020-09-02: 100 mg via INTRAVENOUS

## 2020-09-02 MED ORDER — PROPOFOL 10 MG/ML IV BOLUS
INTRAVENOUS | Status: DC | PRN
  Administered 2020-09-02: 150 mg via INTRAVENOUS

## 2020-09-02 SURGICAL SUPPLY — 29 items
APPLIER CLIP 5 13 M/L LIGAMAX5 (MISCELLANEOUS) ×2
BAG COUNTER SPONGE SURGICOUNT (BAG) IMPLANT
CABLE HIGH FREQUENCY MONO STRZ (ELECTRODE) ×2 IMPLANT
CHLORAPREP W/TINT 26 (MISCELLANEOUS) ×2 IMPLANT
CLIP APPLIE 5 13 M/L LIGAMAX5 (MISCELLANEOUS) ×1 IMPLANT
COVER MAYO STAND STRL (DRAPES) IMPLANT
DECANTER SPIKE VIAL GLASS SM (MISCELLANEOUS) ×2 IMPLANT
DERMABOND ADVANCED (GAUZE/BANDAGES/DRESSINGS) ×1
DERMABOND ADVANCED .7 DNX12 (GAUZE/BANDAGES/DRESSINGS) ×1 IMPLANT
DRAPE C-ARM 42X120 X-RAY (DRAPES) IMPLANT
ELECT REM PT RETURN 15FT ADLT (MISCELLANEOUS) ×2 IMPLANT
GLOVE SURG ENC MOIS LTX SZ7.5 (GLOVE) ×2 IMPLANT
GOWN STRL REUS W/TWL XL LVL3 (GOWN DISPOSABLE) ×4 IMPLANT
HEMOSTAT SURGICEL 4X8 (HEMOSTASIS) IMPLANT
KIT BASIN OR (CUSTOM PROCEDURE TRAY) ×2 IMPLANT
KIT TURNOVER KIT A (KITS) ×2 IMPLANT
PENCIL SMOKE EVACUATOR (MISCELLANEOUS) IMPLANT
POUCH SPECIMEN RETRIEVAL 10MM (ENDOMECHANICALS) ×2 IMPLANT
SCISSORS LAP 5X35 DISP (ENDOMECHANICALS) ×2 IMPLANT
SET CHOLANGIOGRAPH MIX (MISCELLANEOUS) IMPLANT
SET IRRIG TUBING LAPAROSCOPIC (IRRIGATION / IRRIGATOR) ×2 IMPLANT
SET TUBE SMOKE EVAC HIGH FLOW (TUBING) ×2 IMPLANT
SLEEVE XCEL OPT CAN 5 100 (ENDOMECHANICALS) ×4 IMPLANT
SUT MNCRL AB 4-0 PS2 18 (SUTURE) ×2 IMPLANT
TOWEL OR 17X26 10 PK STRL BLUE (TOWEL DISPOSABLE) ×2 IMPLANT
TOWEL OR NON WOVEN STRL DISP B (DISPOSABLE) ×2 IMPLANT
TRAY LAPAROSCOPIC (CUSTOM PROCEDURE TRAY) ×2 IMPLANT
TROCAR BLADELESS OPT 5 100 (ENDOMECHANICALS) ×2 IMPLANT
TROCAR XCEL BLUNT TIP 100MML (ENDOMECHANICALS) ×2 IMPLANT

## 2020-09-02 NOTE — Transfer of Care (Signed)
Immediate Anesthesia Transfer of Care Note  Patient: Norma [name]  Procedure(s) Performed: LAPAROSCOPIC CHOLECYSTECTOMY (Abdomen)  Patient Location: PACU  Anesthesia Type:General  Level of Consciousness: awake, alert  and oriented  Airway & Oxygen Therapy: Patient Spontanous Breathing and Patient connected to face mask oxygen  Post-op Assessment: Report given to RN and Post -op Vital signs reviewed and stable  Post vital signs: Reviewed and stable  Last Vitals:  Vitals Value Taken Time  BP 153/90 09/02/20 1031  Temp 36.7 C 09/02/20 1031  Pulse 94 09/02/20 1032  Resp 17 09/02/20 1032  SpO2 97 % 09/02/20 1032  Vitals shown include unvalidated device data.  Last Pain:  Vitals:   09/02/20 0742  TempSrc:   PainSc: 8       Patients Stated Pain Goal: 2 (09/02/20 0428)  Complications: No notable events documented.

## 2020-09-02 NOTE — Op Note (Signed)
Preoperative diagnosis: Acute cholecystitis   Postoperative diagnosis:Acute cholecystitis   Procedure: Laparoscopic cholecystectomy   Surgeon: Riley Lam A. Magnus Ivan, M.D.  Asst.:  Susa Day, MD.  Anesthesia: Gen.  Indication:   Acute cholecystitis, pt has a history of biliary cholic   Technique: The patient was brought to the operating room, placed supine on the operating table, and a general anesthetic was administered.  The abdominal wall was then sterilely prepped and draped. A small subumbilical incision was made through the skin, subcutaneous tissue, fascia, and peritoneum entering the peritoneal cavity under direct vision. A pursestring suture of 0 Vicryl was placed around the edges of the fascia. A Hassan trocar was introduced into the peritoneal cavity and a pneumoperitoneum was created by insufflation of carbon dioxide gas. The laparoscope was introduced into the trocar and no underlying bleeding or organ injury was noted. The patient was then placed in the reverse Trendelenburg position with the right side tilted slightly up.  Three more trochars were then placed into the abdominal cavity under laparoscopic vision. One in the epigastric area, and 2 in the right upper quadrant area. The gallbladder was visualized and the fundus was grasped and retracted toward the right shoulder.  The infundibulum was mobilized with dissection close to the gallbladder and retracted laterally. The cystic duct was identified and a window was created around it. The cystic artery was also identified and a window was created around it. The critical view was achieved.  The cystic artery was then clipped with two clips on the proximal side and divided. The cystic duct was clipped with three clips on the proximal side and one on the distal side. The cystic duct was divided. Following this the gallbladder was dissected free from the liver using electrocautery. A small hole was made into the gallbladder with  spillage of bile but no spillage of stones. The gallbladder was then placed in a retrieval bag and removed from the abdominal cavity through the subumbilical incision.  The gallbladder fossa was inspected, irrigated, and bleeding was controlled with electrocautery. Inspection showed that hemostasis was adequate and there was no evidence of bile leak.  The irrigation fluid was evacuated as much as possible.  The subumbilical trocar was removed and the fascial defect was closed by tightening and tying down the pursestring suture under laparoscopic vision.  The remaining trochars were removed and the pneumoperitoneum was released. Local anesthetic (Marcaine) was infiltrated in the subumbilical region and at the torchar sites.The skin incisions were closed with 4-0 Monocryl subcuticular stitches. Dermabond was applied.  The procedure was well-tolerated without any apparent complications. The patient was taken to the recovery room in satisfactory condition.

## 2020-09-02 NOTE — Progress Notes (Signed)
Patient ID: Norma [name], female   DOB: Nov 23, 1960, 60 y.o.   MRN: [id number]   Pre Procedure note for inpatients:   Norma [name] has been scheduled for Procedure(s): LAPAROSCOPIC CHOLECYSTECTOMY (N/A) today. The various methods of treatment have been discussed with the patient. After consideration of the risks, benefits and treatment options the patient has consented to the planned procedure.   The patient has been seen and labs reviewed. There are no changes in the patient's condition to prevent proceeding with the planned procedure today.  Recent labs:  Lab Results  Component Value Date   WBC 6.5 09/02/2020   HGB 12.9 09/02/2020   HCT 39.3 09/02/2020   PLT 213 09/02/2020   GLUCOSE 107 (H) 09/02/2020   ALT 71 (H) 09/02/2020   AST 72 (H) 09/02/2020   NA 141 09/02/2020   K 4.3 09/02/2020   CL 109 09/02/2020   CREATININE 0.53 09/02/2020   BUN 8 09/02/2020   CO2 25 09/02/2020    Abigail Miyamoto, MD 09/02/2020 7:15 AM

## 2020-09-02 NOTE — Anesthesia Postprocedure Evaluation (Signed)
Anesthesia Post Note  Patient: Norma [name]  Procedure(s) Performed: LAPAROSCOPIC CHOLECYSTECTOMY (Abdomen)     Patient location during evaluation: PACU Anesthesia Type: General Level of consciousness: awake and alert Pain management: pain level controlled Vital Signs Assessment: post-procedure vital signs reviewed and stable Respiratory status: spontaneous breathing, nonlabored ventilation, respiratory function stable and patient connected to nasal cannula oxygen Cardiovascular status: blood pressure returned to baseline and stable Postop Assessment: no apparent nausea or vomiting Anesthetic complications: no   No notable events documented.  Last Vitals:  Vitals:   09/02/20 1240 09/02/20 1352  BP: 137/85 115/81  Pulse: 87 99  Resp: 16 16  Temp: 36.6 C 36.7 C  SpO2: 95% 95%    Last Pain:  Vitals:   09/02/20 1352  TempSrc: Oral  PainSc:                  Danyiel Crespin L Cleavon Goldman

## 2020-09-02 NOTE — Discharge Summary (Signed)
[facility] Discharge Summary   Patient ID: SKI POLICH MRN: [id number] DOB/AGE: 1960/10/06 60 y.o.  Admit date: 09/01/2020 Discharge date: 09/02/2020  Admitting Diagnosis: Symptomatic cholelithiasis   Discharge Diagnosis Acute cholecystitis  S/P laparoscopic cholecystectomy  Consultants None   Imaging: US Abdomen Limited RUQ (LIVER/GB)  Result Date: 09/01/2020 CLINICAL DATA:  Right upper quadrant pain. EXAM: ULTRASOUND ABDOMEN LIMITED RIGHT UPPER QUADRANT COMPARISON:  CT August 12, 2020. FINDINGS: Gallbladder: Cholelithiasis measuring up to 2 cm. Wall thickness is upper limits of normal at 2.9 mm. No pericholecystic fluid. Adenomyomatosis of the gallbladder wall. Sonographic Murphy sign noted by sonographer. Common bile duct: Diameter: 9 mm Liver: No focal lesion identified. Diffusely increased parenchymal echogenicity. Portal vein is patent on color Doppler imaging with normal direction of blood flow towards the liver. Other: None. IMPRESSION: 1. Cholelithiasis with borderline gallbladder wall thickening and positive sonographic Murphy sign. Findings which are suspicious for acute cholecystitis. 2. Dilated common bile duct measuring 9 mm, no choledocholithiasis visualized. Correlation with serum bilirubin suggested to evaluate for obstruction. If clinical concern for biliary ductal obstruction this could be further evaluated with MRCP. Electronically Signed   By: Maudry Mayhew MD   On: 09/01/2020 17:38    Procedures Dr. Abigail Miyamoto (09/02/20) - Laparoscopic Cholecystectomy   Hospital Course:  Patient is a 60 year old female who presented to [facility] with RUQ abdominal pain and known cholelithiasis.  Patient was admitted and underwent procedure listed above.  Tolerated procedure well and was transferred to the floor.  Diet was advanced as tolerated.  On POD0, the patient was voiding well, tolerating diet, ambulating well, pain well controlled, vital signs stable, incisions c/d/i  and felt stable for discharge home.  Patient will follow up in our office in 2 weeks and knows to call with questions or concerns. She will call to confirm appointment date/time.     I or a member of my team have reviewed this patient in the Controlled Substance Database.   Allergies as of 09/02/2020       Reactions   Codeine Itching   In high doses   Other Itching, Rash   Chlorhexidine wipes - rash all over body        Medication List     STOP taking these medications    oxyCODONE-acetaminophen 5-325 MG tablet Commonly known as: PERCOCET/ROXICET       TAKE these medications    acetaminophen 325 MG tablet Commonly known as: TYLENOL Take 2 tablets (650 mg total) by mouth every 6 (six) hours as needed for mild pain (or temp > 100).   albuterol 108 (90 Base) MCG/ACT inhaler Commonly known as: VENTOLIN HFA Inhale 2 puffs into the lungs every 6 (six) hours as needed for wheezing or shortness of breath.   buPROPion 150 MG 12 hr tablet Commonly known as: WELLBUTRIN SR Take 150 mg by mouth daily.   citalopram 40 MG tablet Commonly known as: CELEXA Take 40 mg by mouth daily.   clonazePAM 1 MG tablet Commonly known as: KLONOPIN Take 1 mg by mouth at bedtime.   fenofibrate 160 MG tablet Take 160 mg by mouth every evening.   ibuprofen 800 MG tablet Commonly known as: ADVIL Take 1 tablet (800 mg total) by mouth 3 (three) times daily. What changed:  when to take this reasons to take this   multivitamin tablet Take 1 tablet by mouth daily.   ondansetron 4 MG disintegrating tablet Commonly known as: Zofran ODT Take 1 tablet (  4 mg total) by mouth every 8 (eight) hours as needed for nausea or vomiting.   oxyCODONE 5 MG immediate release tablet Commonly known as: Oxy IR/ROXICODONE Take 1-2 tablets (5-10 mg total) by mouth every 6 (six) hours as needed for moderate pain.   pantoprazole 40 MG tablet Commonly known as: Protonix Take 1 tablet (40 mg total) by mouth  daily.   VITAMIN C PO Take 1 tablet by mouth daily.          Follow-up Information     Abigail Miyamoto, MD. Schedule an appointment as soon as possible for a visit in 2 week(s).   Specialty: General Surgery Contact information: [address] [place] [address] [phone]                 Signed: Juliet Rude , [facility] 09/02/2020, 2:26 PM Please see Amion for pager number during day hours 7:00am-4:30pm

## 2020-09-02 NOTE — Discharge Instructions (Signed)
[facility] LAPAROSCOPIC SURGERY: POST OP INSTRUCTIONS Always review your discharge instruction sheet given to you by the facility where your surgery was performed. IF YOU HAVE DISABILITY OR FAMILY LEAVE FORMS, YOU MUST BRING THEM TO THE OFFICE FOR PROCESSING.   DO NOT GIVE THEM TO YOUR DOCTOR.  PAIN CONTROL  First take acetaminophen (Tylenol) AND/or ibuprofen (Advil) to control your pain after surgery.  Follow directions on package.  Taking acetaminophen (Tylenol) and/or ibuprofen (Advil) regularly after surgery will help to control your pain and lower the amount of prescription pain medication you may need.  You should not take more than 3,000 mg (3 grams) of acetaminophen (Tylenol) in 24 hours.  You should not take ibuprofen (Advil), aleve, motrin, naprosyn or other NSAIDS if you have a history of stomach ulcers or chronic kidney disease.  A prescription for pain medication may be given to you upon discharge.  Take your pain medication as prescribed, if you still have uncontrolled pain after taking acetaminophen (Tylenol) or ibuprofen (Advil). Use ice packs to help control pain. If you need a refill on your pain medication, please contact your pharmacy.  They will contact our office to request authorization. Prescriptions will not be filled after 5pm or on week-ends.  HOME MEDICATIONS Take your usually prescribed medications unless otherwise directed.  DIET You should follow a light diet the first few days after arrival home.  Be sure to include lots of fluids daily. Avoid fatty, fried foods.   CONSTIPATION It is common to experience some constipation after surgery and if you are taking pain medication.  Increasing fluid intake and taking a stool softener (such as Colace) will usually help or prevent this problem from occurring.  A mild laxative (Milk of Magnesia or Miralax) should be taken according to package instructions if there are no bowel movements after 48  hours.  WOUND/INCISION CARE Most patients will experience some swelling and bruising in the area of the incisions.  Ice packs will help.  Swelling and bruising can take several days to resolve.  Unless discharge instructions indicate otherwise, follow guidelines below  STERI-STRIPS - you may remove your outer bandages 48 hours after surgery, and you may shower at that time.  You have steri-strips (small skin tapes) in place directly over the incision.  These strips should be left on the skin for 7-10 days.   DERMABOND/SKIN GLUE - you may shower in 24 hours.  The glue will flake off over the next 2-3 weeks. Any sutures or staples will be removed at the office during your follow-up visit.  ACTIVITIES You may resume regular (light) daily activities beginning the next day--such as daily self-care, walking, climbing stairs--gradually increasing activities as tolerated.  You may have sexual intercourse when it is comfortable.  Refrain from any heavy lifting or straining until approved by your doctor. You may drive when you are no longer taking prescription pain medication, you can comfortably wear a seatbelt, and you can safely maneuver your car and apply brakes.  FOLLOW-UP You should see your doctor in the office for a follow-up appointment approximately 2-3 weeks after your surgery.  You should have been given your post-op/follow-up appointment when your surgery was scheduled.  If you did not receive a post-op/follow-up appointment, make sure that you call for this appointment within a day or two after you arrive home to insure a convenient appointment time.   WHEN TO CALL YOUR DOCTOR: Fever over 101.0 Inability to urinate Continued bleeding from incision.   Increased pain, redness, or drainage from the incision. Increasing abdominal pain  The clinic staff is available to answer your questions during regular business hours.  Please don't hesitate to call and ask to speak to one of the nurses for  clinical concerns.  If you have a medical emergency, go to the nearest emergency room or call 911.  A surgeon from [facility] is always on call at the hospital. [address], [address], [place], [place]  [address] ? [address] [address], [place], [place]   [address] [phone] ? [phone] ? FAX [phone] Web site: [email or web address]      Managing Your Pain After Surgery Without Opioids    Thank you for participating in our program to help patients manage their pain after surgery without opioids. This is part of our effort to provide you with the best care possible, without exposing you or your family to the risk that opioids pose.  What pain can I expect after surgery? You can expect to have some pain after surgery. This is normal. The pain is typically worse the day after surgery, and quickly begins to get better. Many studies have found that many patients are able to manage their pain after surgery with Over-the-Counter (OTC) medications such as Tylenol and Motrin. If you have a condition that does not allow you to take Tylenol or Motrin, notify your surgical team.  How will I manage my pain? The best strategy for controlling your pain after surgery is around the clock pain control with Tylenol (acetaminophen) and Motrin (ibuprofen or Advil). Alternating these medications with each other allows you to maximize your pain control. In addition to Tylenol and Motrin, you can use heating pads or ice packs on your incisions to help reduce your pain.  How will I alternate your regular strength over-the-counter pain medication? You will take a dose of pain medication every three hours. Start by taking 650 mg of Tylenol (2 pills of 325 mg) 3 hours later take 600 mg of Motrin (3 pills of 200 mg) 3 hours after taking the Motrin take 650 mg of Tylenol 3 hours after that take 600 mg of Motrin.   - 1 -  See example - if your first dose of Tylenol is at 12:00  PM   12:00 PM Tylenol 650 mg (2 pills of 325 mg)  3:00 PM Motrin 600 mg (3 pills of 200 mg)  6:00 PM Tylenol 650 mg (2 pills of 325 mg)  9:00 PM Motrin 600 mg (3 pills of 200 mg)  Continue alternating every 3 hours   We recommend that you follow this schedule around-the-clock for at least 3 days after surgery, or until you feel that it is no longer needed. Use the table on the last page of this handout to keep track of the medications you are taking. Important: Do not take more than 3000mg of Tylenol or 3200mg of Motrin in a 24-hour period. Do not take ibuprofen/Motrin if you have a history of bleeding stomach ulcers, severe kidney disease, &/or actively taking a blood thinner  What if I still have pain? If you have pain that is not controlled with the over-the-counter pain medications (Tylenol and Motrin or Advil) you might have what we call "breakthrough" pain. You will receive a prescription for a small amount of an opioid pain medication such as Oxycodone, Tramadol, or Tylenol with Codeine. Use these opioid pills in the first 24 hours after surgery if you have breakthrough pain. Do   not take more than 1 pill every 4-6 hours.  If you still have uncontrolled pain after using all opioid pills, don't hesitate to call our staff using the number provided. We will help make sure you are managing your pain in the best way possible, and if necessary, we can provide a prescription for additional pain medication.   Day 1    Time  Name of Medication Number of pills taken  Amount of Acetaminophen  Pain Level   Comments  AM PM       AM PM       AM PM       AM PM       AM PM       AM PM       AM PM       AM PM       Total Daily amount of Acetaminophen Do not take more than  3,000 mg per day      Day 2    Time  Name of Medication Number of pills taken  Amount of Acetaminophen  Pain Level   Comments  AM PM       AM PM       AM PM       AM PM       AM PM       AM PM       AM  PM       AM PM       Total Daily amount of Acetaminophen Do not take more than  3,000 mg per day      Day 3    Time  Name of Medication Number of pills taken  Amount of Acetaminophen  Pain Level   Comments  AM PM       AM PM       AM PM       AM PM          AM PM       AM PM       AM PM       AM PM       Total Daily amount of Acetaminophen Do not take more than  3,000 mg per day      Day 4    Time  Name of Medication Number of pills taken  Amount of Acetaminophen  Pain Level   Comments  AM PM       AM PM       AM PM       AM PM       AM PM       AM PM       AM PM       AM PM       Total Daily amount of Acetaminophen Do not take more than  3,000 mg per day      Day 5    Time  Name of Medication Number of pills taken  Amount of Acetaminophen  Pain Level   Comments  AM PM       AM PM       AM PM       AM PM       AM PM       AM PM       AM PM       AM PM       Total Daily amount of Acetaminophen Do not take more than    3,000 mg per day       Day 6    Time  Name of Medication Number of pills taken  Amount of Acetaminophen  Pain Level  Comments  AM PM       AM PM       AM PM       AM PM       AM PM       AM PM       AM PM       AM PM       Total Daily amount of Acetaminophen Do not take more than  3,000 mg per day      Day 7    Time  Name of Medication Number of pills taken  Amount of Acetaminophen  Pain Level   Comments  AM PM       AM PM       AM PM       AM PM       AM PM       AM PM       AM PM       AM PM       Total Daily amount of Acetaminophen Do not take more than  3,000 mg per day        For additional information about how and where to safely dispose of unused opioid medications - https://www.morepowerfulnc.org  Disclaimer: This document contains information and/or instructional materials adapted from [facility] for the typical patient with your condition. It does not replace medical advice  from your health care provider because your experience may differ from that of the typical patient. Talk to your health care provider if you have any questions about this document, your condition or your treatment plan. Adapted from [facility]  

## 2020-09-02 NOTE — Anesthesia Procedure Notes (Signed)
Procedure Name: Intubation Date/Time: 09/02/2020 9:07 AM Performed by: Pearson Grippe, CRNA Pre-anesthesia Checklist: Patient identified, Emergency Drugs available, Suction available and Patient being monitored Patient Re-evaluated:Patient Re-evaluated prior to induction Oxygen Delivery Method: Circle system utilized Preoxygenation: Pre-oxygenation with 100% oxygen Induction Type: IV induction Ventilation: Mask ventilation without difficulty Laryngoscope Size: Miller and 2 Grade View: Grade I Tube type: Oral Tube size: 7.0 mm Number of attempts: 1 Airway Equipment and Method: Stylet and Oral airway Placement Confirmation: ETT inserted through vocal cords under direct vision, positive ETCO2 and breath sounds checked- equal and bilateral Secured at: 21 cm Tube secured with: Tape Dental Injury: Teeth and Oropharynx as per pre-operative assessment

## 2020-09-02 NOTE — Anesthesia Preprocedure Evaluation (Addendum)
Anesthesia Evaluation  Patient identified by MRN, date of birth, ID band Patient awake    Reviewed: Allergy & Precautions, NPO status , Patient's Chart, lab work & pertinent test results  History of Anesthesia Complications (+) PONV  Airway Mallampati: III  TM Distance: >3 FB Neck ROM: Full  Mouth opening: Limited Mouth Opening  Dental  (+) Chipped, Dental Advisory Given,    Pulmonary Current Smoker and Patient abstained from smoking.,    Pulmonary exam normal breath sounds clear to auscultation       Cardiovascular negative cardio ROS Normal cardiovascular exam Rhythm:Regular Rate:Normal     Neuro/Psych PSYCHIATRIC DISORDERS Anxiety Depression negative neurological ROS     GI/Hepatic negative GI ROS, Neg liver ROS,   Endo/Other  negative endocrine ROS  Renal/GU negative Renal ROS  negative genitourinary   Musculoskeletal negative musculoskeletal ROS (+)   Abdominal   Peds  Hematology negative hematology ROS (+)   Anesthesia Other Findings   Reproductive/Obstetrics                            Anesthesia Physical Anesthesia Plan  ASA: 2  Anesthesia Plan: General   Post-op Pain Management:    Induction: Intravenous  PONV Risk Score and Plan: 3 and Midazolam, Dexamethasone, Ondansetron and Scopolamine patch - Pre-op  Airway Management Planned: Oral ETT  Additional Equipment:   Intra-op Plan:   Post-operative Plan: Extubation in OR  Informed Consent: I have reviewed the patients History and Physical, chart, labs and discussed the procedure including the risks, benefits and alternatives for the proposed anesthesia with the patient or authorized representative who has indicated his/her understanding and acceptance.     Dental advisory given  Plan Discussed with: CRNA  Anesthesia Plan Comments:        Anesthesia Quick Evaluation

## 2020-09-02 NOTE — Progress Notes (Signed)
Discharge instructions discussed with patient, verbalized agreement and understanding 

## 2020-09-03 ENCOUNTER — Encounter (HOSPITAL_COMMUNITY): Payer: Self-pay | Admitting: Surgery

## 2020-09-03 LAB — SURGICAL PATHOLOGY

## 2020-09-09 DIAGNOSIS — N951 Menopausal and female climacteric states: Secondary | ICD-10-CM | POA: Diagnosis not present

## 2020-09-09 DIAGNOSIS — R5383 Other fatigue: Secondary | ICD-10-CM | POA: Diagnosis not present

## 2020-09-09 DIAGNOSIS — Z6836 Body mass index (BMI) 36.0-36.9, adult: Secondary | ICD-10-CM | POA: Diagnosis not present

## 2020-09-10 DIAGNOSIS — M79672 Pain in left foot: Secondary | ICD-10-CM | POA: Diagnosis not present

## 2020-09-15 DIAGNOSIS — R232 Flushing: Secondary | ICD-10-CM | POA: Diagnosis not present

## 2020-09-15 DIAGNOSIS — R5383 Other fatigue: Secondary | ICD-10-CM | POA: Diagnosis not present

## 2020-09-15 DIAGNOSIS — N951 Menopausal and female climacteric states: Secondary | ICD-10-CM | POA: Diagnosis not present

## 2020-09-17 DIAGNOSIS — M722 Plantar fascial fibromatosis: Secondary | ICD-10-CM | POA: Diagnosis not present

## 2020-09-24 DIAGNOSIS — M67911 Unspecified disorder of synovium and tendon, right shoulder: Secondary | ICD-10-CM | POA: Diagnosis not present

## 2020-09-24 DIAGNOSIS — M24811 Other specific joint derangements of right shoulder, not elsewhere classified: Secondary | ICD-10-CM | POA: Diagnosis not present

## 2020-10-22 ENCOUNTER — Other Ambulatory Visit: Payer: Self-pay | Admitting: Internal Medicine

## 2020-10-22 DIAGNOSIS — Z1231 Encounter for screening mammogram for malignant neoplasm of breast: Secondary | ICD-10-CM

## 2020-10-23 ENCOUNTER — Other Ambulatory Visit: Payer: Self-pay

## 2020-10-23 ENCOUNTER — Ambulatory Visit
Admission: RE | Admit: 2020-10-23 | Discharge: 2020-10-23 | Disposition: A | Discharge status: Home / Self Care | Payer: BC Managed Care – PPO | Source: Ambulatory Visit | Attending: Internal Medicine | Admitting: Internal Medicine

## 2020-10-23 DIAGNOSIS — Z1231 Encounter for screening mammogram for malignant neoplasm of breast: Secondary | ICD-10-CM

## 2020-11-06 ENCOUNTER — Ambulatory Visit (INDEPENDENT_AMBULATORY_CARE_PROVIDER_SITE_OTHER): Payer: BC Managed Care – PPO | Admitting: Plastic Surgery

## 2020-11-06 ENCOUNTER — Other Ambulatory Visit: Payer: Self-pay

## 2020-11-06 ENCOUNTER — Encounter: Payer: Self-pay | Admitting: Plastic Surgery

## 2020-11-06 DIAGNOSIS — M546 Pain in thoracic spine: Secondary | ICD-10-CM | POA: Diagnosis not present

## 2020-11-06 DIAGNOSIS — M549 Dorsalgia, unspecified: Secondary | ICD-10-CM | POA: Insufficient documentation

## 2020-11-06 DIAGNOSIS — M542 Cervicalgia: Secondary | ICD-10-CM | POA: Diagnosis not present

## 2020-11-06 DIAGNOSIS — G8929 Other chronic pain: Secondary | ICD-10-CM

## 2020-11-06 DIAGNOSIS — N62 Hypertrophy of breast: Secondary | ICD-10-CM

## 2020-11-06 NOTE — Progress Notes (Signed)
Patient ID: Norma [name], female    DOB: 05-18-1960, 60 y.o.   MRN: [id number]   Chief Complaint  Patient presents with   Advice Only    Mammary Hyperplasia: The patient is a 60 y.o. female with a history of mammary hyperplasia for several years.  She has extremely large breasts causing symptoms that include the following:  Back pain in the upper and lower back, including neck pain. She pulls or pins her bra straps to provide better lift and relief of the pressure and pain. She notices relief by holding her breast up manually.  Her shoulder straps cause grooves and pain and pressure that requires padding for relief. Pain medication is sometimes required with motrin and tylenol.  Activities that are hindered by enlarged breasts include: exercise and running.  She has tried supportive clothing as well as fitted bras without improvement.  She had a breast reduction 30 years ago.  Her breasts are extremely large and fairly symmetric.  She has hyperpigmentation of the inframammary area on both sides.  The sternal to nipple distance on the right is 29 cm and the left is 29 cm.  The IMF distance is 12 cm.  She is 5 feet 5 inches tall and weighs 216 pounds.  The BMI = 35.9.  Preoperative bra size = unsure cup.  The biggest concern is the excess tissue laterally. The estimated excess breast tissue to be removed at the time of surgery = 500 grams on the left and 500 grams on the right.  Mammogram history: 9/22 and negative.  Family history of breast cancer:  none.  Tobacco use:  yes.   The patient expresses the desire to pursue surgical intervention.   Review of Systems  Constitutional:  Positive for activity change. Negative for appetite change.  HENT: Negative.    Eyes: Negative.   Respiratory: Negative.    Cardiovascular: Negative.   Gastrointestinal: Negative.   Endocrine: Negative.   Genitourinary: Negative.   Musculoskeletal:  Positive for back pain and neck pain.  Skin: Negative.    Neurological: Negative.   Hematological: Negative.   Psychiatric/Behavioral: Negative.     Past Medical History:  Diagnosis Date   Anxiety    Depression    PONV (postoperative nausea and vomiting)    Pre-diabetes    Snores    Volar retinacular ganglion     Past Surgical History:  Procedure Laterality Date   ABDOMINAL HYSTERECTOMY     ANTERIOR CERVICAL DECOMP/DISCECTOMY FUSION N/A 04/10/2013   Procedure: ANTERIOR CERVICAL DECOMPRESSION/DISCECTOMY FUSION 1 LEVEL;  Surgeon: Emilee Hero, MD;  Location: [facility] OR;  Service: Orthopedics;  Laterality: N/A;  Anterior cervical decompression fusion, cervical 6-7 with instrumentation and allograft   APPENDECTOMY     BARTHOLIN CYST MARSUPIALIZATION N/A 03/07/2013   Procedure: BARTHOLIN CYST MARSUPIALIZATION;  Surgeon: Sharon Seller, DO;  Location: [facility] ORS;  Service: Gynecology;  Laterality: N/A;   CHOLECYSTECTOMY N/A 09/02/2020   Procedure: LAPAROSCOPIC CHOLECYSTECTOMY;  Surgeon: Abigail Miyamoto, MD;  Location: [facility] ORS;  Service: General;  Laterality: N/A;   COLONOSCOPY     COLONOSCOPY WITH PROPOFOL N/A 06/15/2016   Procedure: COLONOSCOPY WITH PROPOFOL;  Surgeon: Charolett Bumpers, MD;  Location: [facility];  Service: Endoscopy;  Laterality: N/A;   INCISION AND DRAINAGE HIP     RT   REDUCTION MAMMAPLASTY Bilateral 1978   SHOULDER ARTHROSCOPY     RT   WRIST GANGLION EXCISION     X2 RT WRIST, X1 LT  WRIST      Current Outpatient Medications:    albuterol (VENTOLIN HFA) 108 (90 Base) MCG/ACT inhaler, Inhale 2 puffs into the lungs every 6 (six) hours as needed for wheezing or shortness of breath., Disp: , Rfl:    Ascorbic Acid (VITAMIN C PO), Take 1 tablet by mouth daily., Disp: , Rfl:    buPROPion (WELLBUTRIN SR) 150 MG 12 hr tablet, Take 150 mg by mouth daily., Disp: , Rfl:    citalopram (CELEXA) 40 MG tablet, Take 40 mg by mouth daily., Disp: , Rfl:    clonazePAM (KLONOPIN) 1 MG tablet, Take 1 mg by mouth at bedtime. , Disp: , Rfl:     fenofibrate 160 MG tablet, Take 160 mg by mouth every evening., Disp: , Rfl:    ibuprofen (ADVIL,MOTRIN) 800 MG tablet, Take 1 tablet (800 mg total) by mouth 3 (three) times daily., Disp: 21 tablet, Rfl: 0   Multiple Vitamin (MULTIVITAMIN) tablet, Take 1 tablet by mouth daily., Disp: , Rfl:    ondansetron (ZOFRAN ODT) 4 MG disintegrating tablet, Take 1 tablet (4 mg total) by mouth every 8 (eight) hours as needed for nausea or vomiting., Disp: 20 tablet, Rfl: 0   Objective:   There were no vitals filed for this visit.  Physical Exam Vitals and nursing note reviewed.  Constitutional:      Appearance: Normal appearance.  HENT:     Head: Normocephalic and atraumatic.  Cardiovascular:     Rate and Rhythm: Normal rate.     Pulses: Normal pulses.  Pulmonary:     Effort: Pulmonary effort is normal.  Abdominal:     General: Abdomen is flat. There is no distension.     Tenderness: There is no abdominal tenderness.  Skin:    General: Skin is warm.     Capillary Refill: Capillary refill takes less than 2 seconds.     Coloration: Skin is not jaundiced.     Findings: No bruising.  Neurological:     General: No focal deficit present.     Mental Status: She is alert and oriented to person, place, and time.  Psychiatric:        Mood and Affect: Mood normal.        Behavior: Behavior normal.        Thought Content: Thought content normal.    Assessment & Plan:  Symptomatic mammary hypertrophy  Chronic bilateral thoracic back pain  Neck pain  The procedure the patient selected and that was best for the patient was discussed. The risk were discussed and include but not limited to the following:  Breast asymmetry, fluid accumulation, firmness of the breast, inability to breast feed, loss of nipple or areola, skin loss, change in skin and nipple sensation, fat necrosis of the breast tissue, bleeding, infection and healing delay.  There are risks of anesthesia and injury to nerves or blood  vessels.  Allergic reaction to tape, suture and skin glue are possible.  There will be swelling.  Any of these can lead to the need for revisional surgery.  A breast reduction has potential to interfere with diagnostic procedures in the future.  This procedure is best done when the breast is fully developed.  Changes in the breast will continue to occur over time: pregnancy, weight gain or weigh loss.    Total time: 40 minutes. This includes time spent with the patient during the visit as well as time spent before and after the visit reviewing the chart, documenting the  encounter and ordering pertinent studies. and literature emailed to the patient.   Physical therapy:  ordered Mammogram:  done  Patient to stop smoking and will decrease weight by 10-20 pounds. Will call when done with PT.  Plan for bilateral breast reduction with liposuction.  Pictures were obtained of the patient and placed in the chart with the patient's or guardian's permission.   Alena Bills Taylen Osorto, DO

## 2020-11-17 ENCOUNTER — Encounter (HOSPITAL_BASED_OUTPATIENT_CLINIC_OR_DEPARTMENT_OTHER): Payer: Self-pay

## 2020-11-17 ENCOUNTER — Other Ambulatory Visit: Payer: Self-pay

## 2020-11-17 DIAGNOSIS — Z5321 Procedure and treatment not carried out due to patient leaving prior to being seen by health care provider: Secondary | ICD-10-CM | POA: Insufficient documentation

## 2020-11-17 DIAGNOSIS — R200 Anesthesia of skin: Secondary | ICD-10-CM | POA: Insufficient documentation

## 2020-11-17 DIAGNOSIS — M542 Cervicalgia: Secondary | ICD-10-CM | POA: Insufficient documentation

## 2020-11-17 NOTE — ED Triage Notes (Signed)
Patient here POV from Home with Neck Pain.  Patient states the Pain began approximately 1.5 weeks PTA. Pain begins in the Mid Neck and radiates to the the Right Face and Right Ear.  Gross Neurological Exam unremarkable during Triage. A&Ox4. GCS 15. NAD Noted during Triage.

## 2020-11-18 ENCOUNTER — Emergency Department (HOSPITAL_BASED_OUTPATIENT_CLINIC_OR_DEPARTMENT_OTHER)
Admission: EM | Admit: 2020-11-18 | Discharge: 2020-11-18 | Disposition: A | Discharge status: Home / Self Care | Payer: BC Managed Care – PPO | Attending: Emergency Medicine | Admitting: Emergency Medicine

## 2020-11-19 ENCOUNTER — Other Ambulatory Visit: Payer: Self-pay | Admitting: Orthopedic Surgery

## 2020-11-19 DIAGNOSIS — R519 Headache, unspecified: Secondary | ICD-10-CM

## 2020-11-19 DIAGNOSIS — M4722 Other spondylosis with radiculopathy, cervical region: Secondary | ICD-10-CM | POA: Diagnosis not present

## 2020-11-19 DIAGNOSIS — M542 Cervicalgia: Secondary | ICD-10-CM | POA: Diagnosis not present

## 2020-11-24 ENCOUNTER — Other Ambulatory Visit: Payer: BC Managed Care – PPO

## 2020-11-24 ENCOUNTER — Emergency Department (HOSPITAL_BASED_OUTPATIENT_CLINIC_OR_DEPARTMENT_OTHER): Payer: BC Managed Care – PPO

## 2020-11-24 ENCOUNTER — Encounter (HOSPITAL_BASED_OUTPATIENT_CLINIC_OR_DEPARTMENT_OTHER): Payer: Self-pay | Admitting: Emergency Medicine

## 2020-11-24 ENCOUNTER — Other Ambulatory Visit: Payer: Self-pay

## 2020-11-24 ENCOUNTER — Emergency Department (HOSPITAL_BASED_OUTPATIENT_CLINIC_OR_DEPARTMENT_OTHER)
Admission: EM | Admit: 2020-11-24 | Discharge: 2020-11-24 | Disposition: A | Discharge status: Home / Self Care | Payer: BC Managed Care – PPO | Attending: Emergency Medicine | Admitting: Emergency Medicine

## 2020-11-24 DIAGNOSIS — F17210 Nicotine dependence, cigarettes, uncomplicated: Secondary | ICD-10-CM | POA: Insufficient documentation

## 2020-11-24 DIAGNOSIS — R519 Headache, unspecified: Secondary | ICD-10-CM | POA: Insufficient documentation

## 2020-11-24 DIAGNOSIS — G8929 Other chronic pain: Secondary | ICD-10-CM | POA: Insufficient documentation

## 2020-11-24 DIAGNOSIS — M542 Cervicalgia: Secondary | ICD-10-CM | POA: Insufficient documentation

## 2020-11-24 DIAGNOSIS — D72829 Elevated white blood cell count, unspecified: Secondary | ICD-10-CM | POA: Insufficient documentation

## 2020-11-24 DIAGNOSIS — I6523 Occlusion and stenosis of bilateral carotid arteries: Secondary | ICD-10-CM | POA: Diagnosis not present

## 2020-11-24 DIAGNOSIS — R200 Anesthesia of skin: Secondary | ICD-10-CM | POA: Diagnosis not present

## 2020-11-24 LAB — CBC WITH DIFFERENTIAL/PLATELET
Abs Immature Granulocytes: 0.07 10*3/uL (ref 0.00–0.07)
Basophils Absolute: 0.1 10*3/uL (ref 0.0–0.1)
Basophils Relative: 1 %
Eosinophils Absolute: 0.1 10*3/uL (ref 0.0–0.5)
Eosinophils Relative: 1 %
HCT: 42.8 % (ref 36.0–46.0)
Hemoglobin: 14.4 g/dL (ref 12.0–15.0)
Immature Granulocytes: 1 %
Lymphocytes Relative: 38 %
Lymphs Abs: 4.9 10*3/uL — ABNORMAL HIGH (ref 0.7–4.0)
MCH: 30.1 pg (ref 26.0–34.0)
MCHC: 33.6 g/dL (ref 30.0–36.0)
MCV: 89.4 fL (ref 80.0–100.0)
Monocytes Absolute: 1 10*3/uL (ref 0.1–1.0)
Monocytes Relative: 8 %
Neutro Abs: 6.7 10*3/uL (ref 1.7–7.7)
Neutrophils Relative %: 51 %
Platelets: 279 10*3/uL (ref 150–400)
RBC: 4.79 MIL/uL (ref 3.87–5.11)
RDW: 12.9 % (ref 11.5–15.5)
Smear Review: NORMAL
WBC: 12.8 10*3/uL — ABNORMAL HIGH (ref 4.0–10.5)
nRBC: 0 % (ref 0.0–0.2)

## 2020-11-24 LAB — BASIC METABOLIC PANEL
Anion gap: 11 (ref 5–15)
BUN: 13 mg/dL (ref 8–23)
CO2: 25 mmol/L (ref 22–32)
Calcium: 8.9 mg/dL (ref 8.9–10.3)
Chloride: 100 mmol/L (ref 98–111)
Creatinine, Ser: 0.6 mg/dL (ref 0.44–1.00)
GFR, Estimated: >60 mL/min (ref >60)
Glucose, Bld: 82 mg/dL (ref 70–99)
Potassium: 3.2 mmol/L — ABNORMAL LOW (ref 3.5–5.1)
Sodium: 136 mmol/L (ref 135–145)

## 2020-11-24 MED ORDER — LIDOCAINE 5 % EX PTCH: 1.0000 | MEDICATED_PATCH | CUTANEOUS | 0 refills | Status: AC

## 2020-11-24 MED ORDER — SODIUM CHLORIDE 0.9 % IV BOLUS
1000.0000 mL | Freq: Once | INTRAVENOUS | Status: AC
  Administered 2020-11-24: 1000 mL via INTRAVENOUS

## 2020-11-24 MED ORDER — HYDROMORPHONE HCL 1 MG/ML IJ SOLN
1.0000 mg | Freq: Once | INTRAMUSCULAR | Status: AC
Start: 2020-11-24 — End: 2020-11-24
  Administered 2020-11-24: 1 mg via INTRAVENOUS
  Filled 2020-11-24: qty 1

## 2020-11-24 MED ORDER — IOHEXOL 350 MG/ML SOLN
75.0000 mL | Freq: Once | INTRAVENOUS | Status: AC | PRN
  Administered 2020-11-24: 75 mL via INTRAVENOUS

## 2020-11-24 MED ORDER — HYDROMORPHONE HCL 1 MG/ML IJ SOLN
1.0000 mg | Freq: Once | INTRAMUSCULAR | Status: AC
  Administered 2020-11-24: 1 mg via INTRAVENOUS
  Filled 2020-11-24: qty 1

## 2020-11-24 MED ORDER — KETOROLAC TROMETHAMINE 15 MG/ML IJ SOLN
15.0000 mg | Freq: Once | INTRAMUSCULAR | Status: AC
  Administered 2020-11-24: 15 mg via INTRAVENOUS
  Filled 2020-11-24: qty 1

## 2020-11-24 NOTE — ED Notes (Signed)
D/c paperwork reviewed with pt, including prescription and f/u care. Pt with no questions or concerns at time of d/c. Ambulatory to ED exit.

## 2020-11-24 NOTE — ED Provider Notes (Signed)
[facility] EMERGENCY DEPT Provider Note   CSN: [id number] Arrival date & time: 11/24/20  3716     History Chief Complaint  Patient presents with   Neck Pain   Headache    Norma [name] is a 60 y.o. female.  HPI 60 year old female presents with severe neck pain.  She states that for the last 3 weeks has been having some discomfort in the left posterior neck near her occiput.  However it was chronic coming and going a couple times a day.  Feels like a tightness.  However the last 4 days has been more severe.  She saw her orthopedist who has a CT scan of her head ordered.  However she was also put on prednisone and muscle relaxers and in combination with her hydrocodone none of these things are helping.  Pain is rated as severe.  Radiates up her occiput a little bit.  However no real headache.  No vision changes, fevers, neck stiffness, weakness/numbness in the extremities.  However her left earlobe and left maxillary face feel numb compared to the right side.  It has been that way for about a week.  Past Medical History:  Diagnosis Date   Anxiety    Depression    PONV (postoperative nausea and vomiting)    Pre-diabetes    Snores    Volar retinacular ganglion     Patient Active Problem List   Diagnosis Date Noted   Symptomatic mammary hypertrophy 11/06/2020   Back pain 11/06/2020   Neck pain 11/06/2020   Symptomatic cholelithiasis 09/01/2020   Snoring 08/06/2019   Witnessed episode of apnea 08/06/2019   Excessive sleepiness 08/06/2019   Fatigue 08/06/2019   Insomnia 08/06/2019   Radiculopathy 04/10/2013    Past Surgical History:  Procedure Laterality Date   ABDOMINAL HYSTERECTOMY     ANTERIOR CERVICAL DECOMP/DISCECTOMY FUSION N/A 04/10/2013   Procedure: ANTERIOR CERVICAL DECOMPRESSION/DISCECTOMY FUSION 1 LEVEL;  Surgeon: Emilee Hero, MD;  Location: [facility] OR;  Service: Orthopedics;  Laterality: N/A;  Anterior cervical decompression fusion, cervical 6-7  with instrumentation and allograft   APPENDECTOMY     BARTHOLIN CYST MARSUPIALIZATION N/A 03/07/2013   Procedure: BARTHOLIN CYST MARSUPIALIZATION;  Surgeon: Sharon Seller, DO;  Location: [facility] ORS;  Service: Gynecology;  Laterality: N/A;   CHOLECYSTECTOMY N/A 09/02/2020   Procedure: LAPAROSCOPIC CHOLECYSTECTOMY;  Surgeon: Abigail Miyamoto, MD;  Location: [facility] ORS;  Service: General;  Laterality: N/A;   COLONOSCOPY     COLONOSCOPY WITH PROPOFOL N/A 06/15/2016   Procedure: COLONOSCOPY WITH PROPOFOL;  Surgeon: Charolett Bumpers, MD;  Location: [facility] ENDOSCOPY;  Service: Endoscopy;  Laterality: N/A;   INCISION AND DRAINAGE HIP     RT   REDUCTION MAMMAPLASTY Bilateral 1978   SHOULDER ARTHROSCOPY     RT   WRIST GANGLION EXCISION     X2 RT WRIST, X1 LT WRIST     OB History     Gravida  2   Para  2   Term      Preterm      AB      Living  2      SAB      IAB      Ectopic      Multiple      Live Births              Family History  Problem Relation Age of Onset   Cancer Mother    Cancer Father    Breast cancer Neg Hx  Social History   Tobacco Use   Smoking status: Every Day    Packs/day: 0.25    Years: 46.00    Pack years: 11.50    Types: Cigarettes   Smokeless tobacco: Never  Vaping Use   Vaping Use: Never used  Substance Use Topics   Alcohol use: Yes    Comment: SOCIAL   Drug use: No    Home Medications Prior to Admission medications   Medication Sig Start Date End Date Taking? Authorizing Provider  albuterol (VENTOLIN HFA) 108 (90 Base) MCG/ACT inhaler Inhale 2 puffs into the lungs every 6 (six) hours as needed for wheezing or shortness of breath.   Yes [provider]  Ascorbic Acid (VITAMIN C PO) Take 1 tablet by mouth daily.   Yes [provider]  buPROPion (WELLBUTRIN SR) 150 MG 12 hr tablet Take 150 mg by mouth daily.   Yes [provider]  citalopram (CELEXA) 40 MG tablet Take 40 mg by mouth daily.   Yes [provider]  clonazePAM (KLONOPIN) 1 MG tablet Take 1 mg by mouth at bedtime.    Yes [provider]  fenofibrate 160 MG tablet Take 160 mg by mouth every evening.   Yes [provider]  ibuprofen (ADVIL,MOTRIN) 800 MG tablet Take 1 tablet (800 mg total) by mouth 3 (three) times daily. 04/15/16  Yes Long, Arlyss Repress, MD  lidocaine (LIDODERM) 5 % Place 1 patch onto the skin daily. Remove & Discard patch within 12 hours or as directed by MD 11/24/20  Yes Pricilla Loveless, MD  Multiple Vitamin (MULTIVITAMIN) tablet Take 1 tablet by mouth daily.   Yes [provider]  ondansetron (ZOFRAN ODT) 4 MG disintegrating tablet Take 1 tablet (4 mg total) by mouth every 8 (eight) hours as needed for nausea or vomiting. 08/12/20   Long, Arlyss Repress, MD    Allergies    Codeine and Other  Review of Systems   Review of Systems  Constitutional:  Negative for fever.  Eyes:  Negative for visual disturbance.  Gastrointestinal:  Negative for vomiting.  Musculoskeletal:  Positive for neck pain. Negative for neck stiffness.  Neurological:  Positive for numbness. Negative for weakness and headaches.  All other systems reviewed and are negative.  Physical Exam Updated Vital Signs BP (!) 138/91   Pulse 77   Temp 98.2 F (36.8 C) (Oral)   Resp 16   Ht 5\' 5"  (1.651 m)   Wt 99.8 kg   SpO2 97%   BMI 36.61 kg/m   Physical Exam Vitals and nursing note reviewed.  Constitutional:      Appearance: She is well-developed.  HENT:     Head: Normocephalic and atraumatic.     Right Ear: External ear normal.     Left Ear: External ear normal.     Nose: Nose normal.  Eyes:     General:        Right eye: No discharge.        Left eye: No discharge.     Extraocular Movements: Extraocular movements intact.     Pupils: Pupils are equal, round, and reactive to light.  Neck:   Cardiovascular:     Rate and Rhythm: Normal rate and regular rhythm.     Heart sounds: Normal heart sounds.   Pulmonary:     Effort: Pulmonary effort is normal.     Breath sounds: Normal breath sounds.  Abdominal:     Palpations: Abdomen is soft.  Tenderness: There is no abdominal tenderness.  Musculoskeletal:     Cervical back: Normal range of motion and neck supple. No rigidity. Muscular tenderness present.  Skin:    General: Skin is warm and dry.  Neurological:     Mental Status: She is alert.     Comments: CN 3-12 grossly intact save for decreased sensation to left maxillary face, left ear lobe. 5/5 strength in all 4 extremities. Grossly normal sensation in extremities. Normal finger to nose.   Psychiatric:        Mood and Affect: Mood is not anxious.    ED Results / Procedures / Treatments   Labs (all labs ordered are listed, but only abnormal results are displayed) Labs Reviewed  BASIC METABOLIC PANEL - Abnormal; Notable for the following components:      Result Value   Potassium 3.2 (*)    All other components within normal limits  CBC WITH DIFFERENTIAL/PLATELET - Abnormal; Notable for the following components:   WBC 12.8 (*)    Lymphs Abs 4.9 (*)    All other components within normal limits    EKG None  Radiology CT ANGIO HEAD NECK W WO CM  Result Date: 11/24/2020 CLINICAL DATA:  Vertebral artery dissection suspected CT head 2020 EXAM: CT ANGIOGRAPHY HEAD AND NECK TECHNIQUE: Multidetector CT imaging of the head and neck was performed using the standard protocol during bolus administration of intravenous contrast. Multiplanar CT image reconstructions and MIPs were obtained to evaluate the vascular anatomy. Carotid stenosis measurements (when applicable) are obtained utilizing NASCET criteria, using the distal internal carotid diameter as the denominator. CONTRAST:  35mL OMNIPAQUE IOHEXOL 350 MG/ML SOLN COMPARISON:  08/24/2018 FINDINGS: CT HEAD Brain: There is no acute intracranial hemorrhage, mass effect, or edema. Gray-white differentiation is preserved. There is no  extra-axial fluid collection. Ventricles and sulci are within normal limits in size and configuration. Vascular: No hyperdense vessel. Skull: Calvarium is unremarkable. Sinuses/Orbits: Dependent secretions left maxillary sinus. Orbits are unremarkable. Other: None. Review of the MIP images confirms the above findings CTA NECK Aortic arch: Great vessel origins are patent. Right carotid system: Patent. Mild mixed plaque at the bifurcation. No stenosis. Left carotid system: Patent. Minimal calcified plaque at the bifurcation. No stenosis. Vertebral arteries: Patent and codominant. Stenosis or evidence of dissection. Skeleton: Post ACDF C6-C7.  Cervical spine degenerative changes. Other neck: Unremarkable. Upper chest: No apical lung mass. Review of the MIP images confirms the above findings CTA HEAD Anterior circulation: Intracranial internal carotid arteries are patent. Anterior and middle cerebral arteries are patent. Posterior circulation: Intracranial vertebral arteries are patent. Basilar artery is patent. Major cerebellar artery origins are patent. Venous sinuses: Patent as allowed by contrast bolus timing. Review of the MIP images confirms the above findings IMPRESSION: No acute intracranial abnormality. No large vessel occlusion, hemodynamically significant stenosis, or evidence of dissection. Electronically Signed   By: Guadlupe Spanish M.D.   On: 11/24/2020 11:57    Procedures Procedures   Medications Ordered in ED Medications  HYDROmorphone (DILAUDID) injection 1 mg (1 mg Intravenous Given 11/24/20 1026)  sodium chloride 0.9 % bolus 1,000 mL (0 mLs Intravenous Stopped 11/24/20 1202)  HYDROmorphone (DILAUDID) injection 1 mg (1 mg Intravenous Given 11/24/20 1122)  iohexol (OMNIPAQUE) 350 MG/ML injection 75 mL (75 mLs Intravenous Contrast Given 11/24/20 1127)  ketorolac (TORADOL) 15 MG/ML injection 15 mg (15 mg Intravenous Given 11/24/20 1220)    ED Course  I have reviewed the triage vital signs and the  nursing notes.  Pertinent labs & imaging results that were available during my care of the patient were reviewed by me and considered in my medical decision making (see chart for details).    MDM Rules/Calculators/A&P                           Given location of pain, CT angiography was obtained.  No evidence of vertebral artery dissection or occlusion.  Cervical spine looks stable.  She has no fever.  She has a mild leukocytosis but unclear etiology.  This is not consistent with meningitis and I have a pretty low suspicion of that and do not think LP is warranted.  She does tell me this feels very similar to when she had to have her original neck surgery, even the facial numbness.  She has an MRI scheduled as an outpatient but not for another week.  I do not think she meets any emergent criteria.  With such focal vague numbness to just her 1 area of her face, I think acute ischemia of her brain or spinal cord is pretty low.  I do not think she needs to be transferred for emergent MRI and at this point we will try to add some topical medication in addition to prednisone, Flexeril, and hydrocodone she is already on.  Discharged home with return precautions. Final Clinical Impression(s) / ED Diagnoses Final diagnoses:  Acute neck pain    Rx / DC Orders ED Discharge Orders          Ordered    lidocaine (LIDODERM) 5 %  Every 24 hours        11/24/20 1300             Pricilla Loveless, MD 11/24/20 1336

## 2020-11-24 NOTE — ED Notes (Signed)
Patient transported to CT 

## 2020-11-24 NOTE — Discharge Instructions (Addendum)
If you develop fever, new or worsening neck pain, severe headache, neck stiffness, weakness or numbness in your extremities, incontinence, or any other new/concerning symptoms then return to the ER for evaluation.

## 2020-11-24 NOTE — ED Triage Notes (Signed)
Pt arrives to ED with c/o of neck/head pain x3 weeks. Pt reports that her neck pain has recently worsened and now is constant. Pt describes the pain as burning and tightness. She has an outpatient CT head this Friday ordered.

## 2020-11-26 ENCOUNTER — Other Ambulatory Visit: Payer: Self-pay | Admitting: Orthopedic Surgery

## 2020-11-26 DIAGNOSIS — M542 Cervicalgia: Secondary | ICD-10-CM

## 2020-11-28 ENCOUNTER — Other Ambulatory Visit: Payer: BC Managed Care – PPO

## 2020-11-30 ENCOUNTER — Ambulatory Visit: Payer: BC Managed Care – PPO | Attending: Plastic Surgery | Admitting: Physical Therapy

## 2020-11-30 DIAGNOSIS — M4692 Unspecified inflammatory spondylopathy, cervical region: Secondary | ICD-10-CM | POA: Diagnosis not present

## 2020-12-01 DIAGNOSIS — M542 Cervicalgia: Secondary | ICD-10-CM | POA: Diagnosis not present

## 2020-12-09 ENCOUNTER — Other Ambulatory Visit: Payer: BC Managed Care – PPO

## 2020-12-14 DIAGNOSIS — M542 Cervicalgia: Secondary | ICD-10-CM | POA: Diagnosis not present

## 2020-12-15 DIAGNOSIS — M47812 Spondylosis without myelopathy or radiculopathy, cervical region: Secondary | ICD-10-CM | POA: Diagnosis not present

## 2020-12-15 DIAGNOSIS — F17210 Nicotine dependence, cigarettes, uncomplicated: Secondary | ICD-10-CM | POA: Diagnosis not present

## 2020-12-15 DIAGNOSIS — Z716 Tobacco abuse counseling: Secondary | ICD-10-CM | POA: Diagnosis not present

## 2020-12-22 DIAGNOSIS — N951 Menopausal and female climacteric states: Secondary | ICD-10-CM | POA: Diagnosis not present

## 2020-12-22 DIAGNOSIS — R5383 Other fatigue: Secondary | ICD-10-CM | POA: Diagnosis not present

## 2020-12-25 DIAGNOSIS — R232 Flushing: Secondary | ICD-10-CM | POA: Diagnosis not present

## 2020-12-25 DIAGNOSIS — R6882 Decreased libido: Secondary | ICD-10-CM | POA: Diagnosis not present

## 2020-12-25 DIAGNOSIS — Z6836 Body mass index (BMI) 36.0-36.9, adult: Secondary | ICD-10-CM | POA: Diagnosis not present

## 2020-12-25 DIAGNOSIS — N951 Menopausal and female climacteric states: Secondary | ICD-10-CM | POA: Diagnosis not present

## 2021-01-01 DIAGNOSIS — R197 Diarrhea, unspecified: Secondary | ICD-10-CM | POA: Diagnosis not present

## 2021-01-04 DIAGNOSIS — R197 Diarrhea, unspecified: Secondary | ICD-10-CM | POA: Diagnosis not present

## 2021-01-05 DIAGNOSIS — M47812 Spondylosis without myelopathy or radiculopathy, cervical region: Secondary | ICD-10-CM | POA: Diagnosis not present

## 2021-01-21 DIAGNOSIS — N764 Abscess of vulva: Secondary | ICD-10-CM | POA: Diagnosis not present

## 2021-02-05 DIAGNOSIS — M47812 Spondylosis without myelopathy or radiculopathy, cervical region: Secondary | ICD-10-CM | POA: Diagnosis not present

## 2021-02-15 DIAGNOSIS — R1013 Epigastric pain: Secondary | ICD-10-CM | POA: Diagnosis not present

## 2021-02-15 DIAGNOSIS — R197 Diarrhea, unspecified: Secondary | ICD-10-CM | POA: Diagnosis not present

## 2021-02-16 DIAGNOSIS — R1013 Epigastric pain: Secondary | ICD-10-CM | POA: Diagnosis not present

## 2021-02-16 DIAGNOSIS — R197 Diarrhea, unspecified: Secondary | ICD-10-CM | POA: Diagnosis not present

## 2021-02-24 DIAGNOSIS — R059 Cough, unspecified: Secondary | ICD-10-CM | POA: Diagnosis not present

## 2021-03-04 DIAGNOSIS — M542 Cervicalgia: Secondary | ICD-10-CM | POA: Diagnosis not present

## 2021-03-23 DIAGNOSIS — M47812 Spondylosis without myelopathy or radiculopathy, cervical region: Secondary | ICD-10-CM | POA: Diagnosis not present

## 2021-03-25 DIAGNOSIS — R197 Diarrhea, unspecified: Secondary | ICD-10-CM | POA: Diagnosis not present

## 2021-03-25 DIAGNOSIS — K2930 Chronic superficial gastritis without bleeding: Secondary | ICD-10-CM | POA: Diagnosis not present

## 2021-03-25 DIAGNOSIS — D128 Benign neoplasm of rectum: Secondary | ICD-10-CM | POA: Diagnosis not present

## 2021-03-25 DIAGNOSIS — R1013 Epigastric pain: Secondary | ICD-10-CM | POA: Diagnosis not present

## 2021-03-25 DIAGNOSIS — K5730 Diverticulosis of large intestine without perforation or abscess without bleeding: Secondary | ICD-10-CM | POA: Diagnosis not present

## 2021-03-25 DIAGNOSIS — K31A19 Gastric intestinal metaplasia without dysplasia, unspecified site: Secondary | ICD-10-CM | POA: Diagnosis not present

## 2021-03-25 DIAGNOSIS — K2990 Gastroduodenitis, unspecified, without bleeding: Secondary | ICD-10-CM | POA: Diagnosis not present

## 2021-04-06 DIAGNOSIS — M25552 Pain in left hip: Secondary | ICD-10-CM | POA: Diagnosis not present

## 2021-04-28 DIAGNOSIS — R140 Abdominal distension (gaseous): Secondary | ICD-10-CM | POA: Diagnosis not present

## 2021-04-28 DIAGNOSIS — K5229 Other allergic and dietetic gastroenteritis and colitis: Secondary | ICD-10-CM | POA: Diagnosis not present

## 2021-04-28 DIAGNOSIS — L299 Pruritus, unspecified: Secondary | ICD-10-CM | POA: Diagnosis not present

## 2021-04-28 DIAGNOSIS — K529 Noninfective gastroenteritis and colitis, unspecified: Secondary | ICD-10-CM | POA: Diagnosis not present

## 2021-04-28 DIAGNOSIS — R109 Unspecified abdominal pain: Secondary | ICD-10-CM | POA: Diagnosis not present

## 2021-04-29 DIAGNOSIS — M47812 Spondylosis without myelopathy or radiculopathy, cervical region: Secondary | ICD-10-CM | POA: Diagnosis not present

## 2021-05-22 DIAGNOSIS — R109 Unspecified abdominal pain: Secondary | ICD-10-CM | POA: Diagnosis not present

## 2021-05-22 DIAGNOSIS — J209 Acute bronchitis, unspecified: Secondary | ICD-10-CM | POA: Diagnosis not present

## 2021-05-22 DIAGNOSIS — R140 Abdominal distension (gaseous): Secondary | ICD-10-CM | POA: Diagnosis not present

## 2021-05-22 DIAGNOSIS — K529 Noninfective gastroenteritis and colitis, unspecified: Secondary | ICD-10-CM | POA: Diagnosis not present

## 2021-05-22 DIAGNOSIS — K5229 Other allergic and dietetic gastroenteritis and colitis: Secondary | ICD-10-CM | POA: Diagnosis not present

## 2021-05-24 DIAGNOSIS — M47812 Spondylosis without myelopathy or radiculopathy, cervical region: Secondary | ICD-10-CM | POA: Diagnosis not present

## 2021-05-29 DIAGNOSIS — N3281 Overactive bladder: Secondary | ICD-10-CM | POA: Diagnosis not present

## 2021-05-29 DIAGNOSIS — R159 Full incontinence of feces: Secondary | ICD-10-CM | POA: Diagnosis not present

## 2021-06-25 DIAGNOSIS — M4692 Unspecified inflammatory spondylopathy, cervical region: Secondary | ICD-10-CM | POA: Diagnosis not present

## 2021-06-25 DIAGNOSIS — M542 Cervicalgia: Secondary | ICD-10-CM | POA: Diagnosis not present

## 2021-06-25 DIAGNOSIS — M62838 Other muscle spasm: Secondary | ICD-10-CM | POA: Diagnosis not present

## 2021-07-22 DIAGNOSIS — H2513 Age-related nuclear cataract, bilateral: Secondary | ICD-10-CM | POA: Diagnosis not present

## 2021-07-22 DIAGNOSIS — H524 Presbyopia: Secondary | ICD-10-CM | POA: Diagnosis not present

## 2021-07-22 DIAGNOSIS — H04123 Dry eye syndrome of bilateral lacrimal glands: Secondary | ICD-10-CM | POA: Diagnosis not present

## 2021-09-03 DIAGNOSIS — E559 Vitamin D deficiency, unspecified: Secondary | ICD-10-CM | POA: Diagnosis not present

## 2021-09-03 DIAGNOSIS — R635 Abnormal weight gain: Secondary | ICD-10-CM | POA: Diagnosis not present

## 2021-09-03 DIAGNOSIS — E039 Hypothyroidism, unspecified: Secondary | ICD-10-CM | POA: Diagnosis not present

## 2021-09-03 DIAGNOSIS — K529 Noninfective gastroenteritis and colitis, unspecified: Secondary | ICD-10-CM | POA: Diagnosis not present

## 2021-09-03 DIAGNOSIS — K909 Intestinal malabsorption, unspecified: Secondary | ICD-10-CM | POA: Diagnosis not present

## 2021-09-03 DIAGNOSIS — R5383 Other fatigue: Secondary | ICD-10-CM | POA: Diagnosis not present

## 2021-09-03 DIAGNOSIS — R7303 Prediabetes: Secondary | ICD-10-CM | POA: Diagnosis not present

## 2021-09-03 DIAGNOSIS — R140 Abdominal distension (gaseous): Secondary | ICD-10-CM | POA: Diagnosis not present

## 2021-09-03 DIAGNOSIS — R159 Full incontinence of feces: Secondary | ICD-10-CM | POA: Diagnosis not present

## 2021-09-09 DIAGNOSIS — R059 Cough, unspecified: Secondary | ICD-10-CM | POA: Diagnosis not present

## 2021-09-22 ENCOUNTER — Other Ambulatory Visit: Payer: Self-pay | Admitting: Internal Medicine

## 2021-09-22 DIAGNOSIS — Z1231 Encounter for screening mammogram for malignant neoplasm of breast: Secondary | ICD-10-CM

## 2021-10-12 ENCOUNTER — Emergency Department (HOSPITAL_BASED_OUTPATIENT_CLINIC_OR_DEPARTMENT_OTHER): Payer: Self-pay

## 2021-10-12 ENCOUNTER — Encounter (HOSPITAL_BASED_OUTPATIENT_CLINIC_OR_DEPARTMENT_OTHER): Payer: Self-pay

## 2021-10-12 ENCOUNTER — Other Ambulatory Visit: Payer: Self-pay

## 2021-10-12 ENCOUNTER — Emergency Department (HOSPITAL_BASED_OUTPATIENT_CLINIC_OR_DEPARTMENT_OTHER)
Admission: EM | Admit: 2021-10-12 | Discharge: 2021-10-12 | Disposition: A | Discharge status: Home / Self Care | Payer: Self-pay | Attending: Emergency Medicine | Admitting: Emergency Medicine

## 2021-10-12 ENCOUNTER — Other Ambulatory Visit (HOSPITAL_BASED_OUTPATIENT_CLINIC_OR_DEPARTMENT_OTHER): Payer: Self-pay

## 2021-10-12 DIAGNOSIS — I10 Essential (primary) hypertension: Secondary | ICD-10-CM | POA: Insufficient documentation

## 2021-10-12 DIAGNOSIS — Z720 Tobacco use: Secondary | ICD-10-CM | POA: Insufficient documentation

## 2021-10-12 DIAGNOSIS — R202 Paresthesia of skin: Secondary | ICD-10-CM | POA: Insufficient documentation

## 2021-10-12 DIAGNOSIS — R42 Dizziness and giddiness: Secondary | ICD-10-CM | POA: Insufficient documentation

## 2021-10-12 DIAGNOSIS — R079 Chest pain, unspecified: Secondary | ICD-10-CM

## 2021-10-12 DIAGNOSIS — R0789 Other chest pain: Secondary | ICD-10-CM | POA: Insufficient documentation

## 2021-10-12 LAB — CBC WITH DIFFERENTIAL/PLATELET
Abs Immature Granulocytes: 0.03 10*3/uL (ref 0.00–0.07)
Basophils Absolute: 0.1 10*3/uL (ref 0.0–0.1)
Basophils Relative: 1 %
Eosinophils Absolute: 0.2 10*3/uL (ref 0.0–0.5)
Eosinophils Relative: 2 %
HCT: 42.7 % (ref 36.0–46.0)
Hemoglobin: 14.6 g/dL (ref 12.0–15.0)
Immature Granulocytes: 0 %
Lymphocytes Relative: 39 %
Lymphs Abs: 3.8 10*3/uL (ref 0.7–4.0)
MCH: 31.4 pg (ref 26.0–34.0)
MCHC: 34.2 g/dL (ref 30.0–36.0)
MCV: 91.8 fL (ref 80.0–100.0)
Monocytes Absolute: 0.7 10*3/uL (ref 0.1–1.0)
Monocytes Relative: 7 %
Neutro Abs: 4.8 10*3/uL (ref 1.7–7.7)
Neutrophils Relative %: 51 %
Platelets: 271 10*3/uL (ref 150–400)
RBC: 4.65 MIL/uL (ref 3.87–5.11)
RDW: 13.1 % (ref 11.5–15.5)
WBC: 9.6 10*3/uL (ref 4.0–10.5)
nRBC: 0 % (ref 0.0–0.2)

## 2021-10-12 LAB — BASIC METABOLIC PANEL
Anion gap: 12 (ref 5–15)
BUN: 13 mg/dL (ref 8–23)
CO2: 24 mmol/L (ref 22–32)
Calcium: 9.6 mg/dL (ref 8.9–10.3)
Chloride: 102 mmol/L (ref 98–111)
Creatinine, Ser: 0.66 mg/dL (ref 0.44–1.00)
GFR, Estimated: >60 mL/min (ref >60)
Glucose, Bld: 92 mg/dL (ref 70–99)
Potassium: 4.4 mmol/L (ref 3.5–5.1)
Sodium: 138 mmol/L (ref 135–145)

## 2021-10-12 LAB — TROPONIN I (HIGH SENSITIVITY)
Troponin I (High Sensitivity): 2 ng/L (ref <18)
Troponin I (High Sensitivity): <2 ng/L (ref <18)

## 2021-10-12 MED ORDER — IBUPROFEN 400 MG PO TABS
600.0000 mg | ORAL_TABLET | Freq: Once | ORAL | Status: AC
  Administered 2021-10-12: 600 mg via ORAL
  Filled 2021-10-12: qty 1

## 2021-10-12 MED ORDER — IOHEXOL 350 MG/ML SOLN
100.0000 mL | Freq: Once | INTRAVENOUS | Status: AC | PRN
  Administered 2021-10-12: 80 mL via INTRAVENOUS

## 2021-10-12 NOTE — ED Triage Notes (Signed)
Pt states started about a week ago been coming and going. States she feels tight in chest, having running down left side of arm. Intermittent pain, with tightness and sharp pains. States pain 7/10. Denies SHOB, dizzy, vomiting.

## 2021-10-12 NOTE — Discharge Instructions (Signed)
The work-up today was all reassuring.  I will place an ambulatory referral for cardiology.  Call the number attached to set up an appointment so we can get them from both legs.  Please do not hesitate to return to the emergency department the worrisome signs and symptoms we discussed become apparent.

## 2021-10-12 NOTE — ED Notes (Signed)
Patient transported to CT 

## 2021-10-12 NOTE — ED Provider Notes (Signed)
[facility]   CSN: [id number] Arrival date & time: 10/12/21  1019     History  Chief Complaint  Patient presents with   Chest Pain    Norma SenterLisa C Coberly is a 61 y.o. female.   Chest Pain   61 year old female presents emergency department with complaints of chest pain.  Patient states that she has had chest pain for approximately 1 week.  She describes chest pain as tightness with radiation to her left scapula.  She states pain has been intermittent in nature.  She denies any associated shortness of breath.  She states the pain is worsened with positional changes such as getting up from a seated position.  She notes some associated dizziness with positional changes.  She reports tingling sensation in her left upper extremity.  Denies any recent illness or cardiac or pulmonary history.  Reports history of cigarette use but denies illicit drug use.  Denies fever, chills, night sweats, cough, congestion, abdominal pain, nausea, vomiting, urinary/vaginal symptoms, change in bowel habits.  Denies history of DVT/PE, recent surgery, hormone therapy, known malignancy, recent immobilization.  Past medical history significant for prediabetes, cigarette use  Home Medications Prior to Admission medications   Medication Sig Start Date End Date Taking? Authorizing Provider  albuterol (VENTOLIN HFA) 108 (90 Base) MCG/ACT inhaler Inhale 2 puffs into the lungs every 6 (six) hours as needed for wheezing or shortness of breath.    [provider]  Ascorbic Acid (VITAMIN C PO) Take 1 tablet by mouth daily.    [provider]  buPROPion (WELLBUTRIN SR) 150 MG 12 hr tablet Take 150 mg by mouth daily.    [provider]  buPROPion (WELLBUTRIN XL) 300 MG 24 hr tablet Take 300 mg by mouth every morning. 09/16/21   [provider]  citalopram (CELEXA) 40 MG tablet Take 40 mg by mouth daily.    [provider]  clonazePAM (KLONOPIN)  1 MG tablet Take 1 mg by mouth at bedtime.     [provider]  fenofibrate 160 MG tablet Take 160 mg by mouth every evening.    [provider]  ibuprofen (ADVIL,MOTRIN) 800 MG tablet Take 1 tablet (800 mg total) by mouth 3 (three) times daily. 04/15/16   Long, Arlyss RepressJoshua G, MD  lidocaine (LIDODERM) 5 % Place 1 patch onto the skin daily. Remove & Discard patch within 12 hours or as directed by MD 11/24/20   Pricilla LovelessGoldston, Scott, MD  Multiple Vitamin (MULTIVITAMIN) tablet Take 1 tablet by mouth daily.    [provider]  ondansetron (ZOFRAN ODT) 4 MG disintegrating tablet Take 1 tablet (4 mg total) by mouth every 8 (eight) hours as needed for nausea or vomiting. 08/12/20   Long, Arlyss RepressJoshua G, MD      Allergies    Codeine and Other    Review of Systems   Review of Systems  Cardiovascular:  Positive for chest pain.    Physical Exam Updated Vital Signs BP (!) 141/78   Pulse 76   Temp 97.9 F (36.6 C)   Resp 16   Ht 5\' 5"  (1.651 m)   Wt 99.8 kg   SpO2 98%   BMI 36.61 kg/m  Physical Exam Vitals and nursing note reviewed.  Constitutional:      General: She is not in acute distress.    Appearance: She is well-developed.  HENT:     Head: Normocephalic and atraumatic.  Eyes:     Conjunctiva/sclera: Conjunctivae normal.  Cardiovascular:     Rate and Rhythm: Normal rate and regular rhythm.     Pulses: Normal pulses.     Heart sounds: No murmur heard. Pulmonary:     Effort: Pulmonary effort is normal. No respiratory distress.     Breath sounds: Normal breath sounds. No wheezing or rales.  Abdominal:     Palpations: Abdomen is soft.     Tenderness: There is no abdominal tenderness.  Musculoskeletal:        General: No swelling.     Cervical back: Neck supple. No rigidity or tenderness.     Right lower leg: No edema.     Left lower leg: No edema.  Skin:    General: Skin is warm and dry.     Capillary Refill: Capillary refill takes less than 2 seconds.   Neurological:     Mental Status: She is alert.  Psychiatric:        Mood and Affect: Mood normal.     ED Results / Procedures / Treatments   Labs (all labs ordered are listed, but only abnormal results are displayed) Labs Reviewed  CBC WITH DIFFERENTIAL/PLATELET  BASIC METABOLIC PANEL  TROPONIN I (HIGH SENSITIVITY)  TROPONIN I (HIGH SENSITIVITY)    EKG EKG Interpretation  Date/Time:  Tuesday October 12 2021 10:37:51 EDT Ventricular Rate:  84 PR Interval:  162 QRS Duration: 78 QT Interval:  394 QTC Calculation: 465 R Axis:   63 Text Interpretation: Normal sinus rhythm Normal ECG When compared with ECG of 12-Aug-2020 03:27, PREVIOUS ECG IS PRESENT Confirmed by Virgina Norfolk ([id number]) on 10/12/2021 10:40:16 AM  Radiology CT Angio Chest/Abd/Pel for Dissection W and/or W/WO  Result Date: 10/12/2021 CLINICAL DATA:  Chest pain, pain left upper extremity EXAM: CT ANGIOGRAPHY CHEST, ABDOMEN AND PELVIS TECHNIQUE: Non-contrast CT of the chest was initially obtained. Multidetector CT imaging through the chest, abdomen and pelvis was performed using the standard protocol during bolus administration of intravenous contrast. Multiplanar reconstructed images and MIPs were obtained and reviewed to evaluate the vascular anatomy. RADIATION DOSE REDUCTION: This exam was performed according to the departmental dose-optimization program which includes automated exposure control, adjustment of the mA and/or kV according to patient size and/or use of iterative reconstruction technique. CONTRAST:  7mL OMNIPAQUE IOHEXOL 350 MG/ML SOLN COMPARISON:  Coronary CT done on 08/31/2020 and chest radiographs done today FINDINGS: CTA CHEST FINDINGS Cardiovascular: There is no demonstrable mural hematoma in the noncontrast images of thoracic aorta. There are small scattered coronary artery calcifications. There is homogeneous enhancement in thoracic aorta. There is no demonstrable intimal flap. Major branches of  thoracic aorta appear patent. There are no intraluminal filling defects in pulmonary artery branches. Mediastinum/Nodes: No significant lymphadenopathy is seen. Lungs/Pleura: There is no focal pulmonary consolidation. Small linear densities in lingula and right middle lobe may suggest scarring or subsegmental atelectasis. There is no pleural effusion or pneumothorax. Musculoskeletal: Bony structures in the thorax are unremarkable. There is previous surgical fusion in lower cervical spine. Review of the MIP images confirms the above findings. CTA ABDOMEN AND PELVIS FINDINGS VASCULAR Aorta: Scattered calcifications are seen. There is no dissection or focal aneurysmal dilation. Celiac: Unremarkable. SMA: Unremarkable. Renals: Unremarkable. IMA: Patent. Inflow: Unremarkable. Veins: Unremarkable. Review of the MIP images confirms the above findings. NON-VASCULAR Hepatobiliary: Liver measures 19.5 cm in length. There is fatty infiltration. Surgical clips are seen in gallbladder fossa. Pancreas: No focal abnormalities are seen. Spleen: Unremarkable. Adrenals/Urinary Tract: Adrenals are unremarkable. There is no hydronephrosis. There are no  renal or ureteral stones. Urinary bladder is unremarkable. Stomach/Bowel: Stomach is unremarkable. Small bowel loops are not dilated. Appendix is not seen. There is no pericecal inflammation. There is no significant wall thickening in colon. There is no pericolic stranding. Few diverticula are seen in colon without signs of focal acute diverticulitis. Rectum appears lower than usual in position suggesting possible rectocele. Lymphatic: No significant lymphadenopathy is seen. Reproductive: Uterus is not seen. Other: There is no ascites or pneumoperitoneum. Umbilical hernia containing fat is seen. Left inguinal hernia containing fat is seen. Musculoskeletal: Degenerative changes are noted in thoracic spine, more prominent at T4-T5 level. Review of the MIP images confirms the above  findings. IMPRESSION: There is no evidence of dissection in thoracic and abdominal aorta. Major branches of thoracic and abdominal aorta appear patent. There is no evidence of pulmonary artery embolism. Scattered coronary artery calcifications are seen. There is no evidence of intestinal obstruction or pneumoperitoneum. There is no hydronephrosis. Enlarged fatty liver.  Diverticulosis of colon. Other findings as described in the body of the report. Electronically Signed   By: Ernie Avena M.D.   On: 10/12/2021 12:57   DG Chest 1 View  Result Date: 10/12/2021 CLINICAL DATA:  Provided history: Chest pain and dizziness. EXAM: CHEST  1 VIEW COMPARISON:  Prior chest radiograph 06/28/2020 and earlier. FINDINGS: Heart size within normal limits. No appreciable airspace consolidation. No evidence of pleural effusion or pneumothorax. No acute bony abnormality identified. IMPRESSION: No evidence of acute cardiopulmonary abnormality. Electronically Signed   By: Jackey Loge D.O.   On: 10/12/2021 11:02    Procedures Procedures    Medications Ordered in ED Medications  iohexol (OMNIPAQUE) 350 MG/ML injection 100 mL (80 mLs Intravenous Contrast Given 10/12/21 1228)  ibuprofen (ADVIL) tablet 600 mg (600 mg Oral Given 10/12/21 1339)    ED Course/ Medical Decision Making/ A&P                            Medical Decision Making Amount and/or Complexity of Data Reviewed Labs: ordered. Decision-making details documented in ED Course. Radiology: ordered.  Risk Prescription drug management.   This patient presents to the ED for concern of chest pain, this involves an extensive number of treatment options, and is a complaint that carries with it a high risk of complications and morbidity.  The differential diagnosis includes ACS,   Co morbidities that complicate the patient evaluation  See HPI   Additional history obtained:  Additional history obtained from EMR External records from outside  source obtained and reviewed including CT coronary calcium score: 13 with LAD of 12.8, left circumflex 0, left main of 0 with right circumflex 7.194.  Lab Tests:  I Ordered, and personally interpreted labs.  The pertinent results include: No leukocytosis.  No evidence anemia.  Platelets within normal range.  No electrolyte abnormalities.  Renal function within normal limits.  Initial troponin less than 2 with repeat less than 2; no new EKG changes indicative ischemia.  Doubt ACS.   Imaging Studies ordered:  I ordered imaging studies including chest x-ray, CT angio chest abdomen pelvis I independently visualized and interpreted imaging which showed  Chest x-ray: No acute abnormalities CT angio chest and abdomen pelvis: No acute abnormalities I agree with the radiologist interpretation   Cardiac Monitoring: / EKG:  The patient was maintained on a cardiac monitor.  I personally viewed and interpreted the cardiac monitored which showed an underlying rhythm of: Sinus rhythm  Consultations Obtained:  Attending physician Dr. Ronnald Nian was consulted on the patient he agreed with treatment plan of the patient's after independent assessment.   Problem List / ED Course / Critical interventions / Medication management  Chest pain I ordered medication including Advil for pain   Reevaluation of the patient after these medicines showed that the patient improved I have reviewed the patients home medicines and have made adjustments as needed   Social Determinants of Health:  Current cigarette use.  Denies illicit drug use.   Test / Admission - Considered:  Chest pain Vitals signs significant for hypertension with a blood pressure 141/78.  Recommend close follow-up with PCP regarding elevation of blood pressure. Otherwise within normal range and stable throughout visit. Laboratory/imaging studies significant for: See above Doubt ACS given delta negative troponin and no new EKG findings  indicative of ischemia.  Patient had coronary calcium score performed less than 1 year ago with score of 13.  CT angio chest and pelvis negative for dissection or pulmonary embolism.  Unsure of exact etiology of patient's pain but doubt emergent process.  Close follow-up with cardiology outpatient recommended for patient symptoms.  Ambulatory referral for cardiology placed while emergency department.  Treatment plan discussed with the patient and she knowledge understanding was agreeable to said plan. Worrisome signs and symptoms were discussed with the patient, and the patient acknowledged understanding to return to the ED if noticed. Patient was stable upon discharge.          Final Clinical Impression(s) / ED Diagnoses Final diagnoses:  Chest pain, unspecified type    Rx / DC Orders ED Discharge Orders          Ordered    Ambulatory referral to Cardiology       Comments: If you have not heard from the Cardiology office within the next 72 hours please call [phone].   10/12/21 [place], [place], [place] 10/12/21 1448    Lennice Sites, DO 10/13/21 1227

## 2021-10-26 ENCOUNTER — Ambulatory Visit: Payer: BC Managed Care – PPO

## 2021-11-01 ENCOUNTER — Emergency Department (HOSPITAL_BASED_OUTPATIENT_CLINIC_OR_DEPARTMENT_OTHER): Payer: BC Managed Care – PPO | Admitting: Radiology

## 2021-11-01 ENCOUNTER — Emergency Department (HOSPITAL_BASED_OUTPATIENT_CLINIC_OR_DEPARTMENT_OTHER)
Admission: EM | Admit: 2021-11-01 | Discharge: 2021-11-01 | Disposition: A | Discharge status: Home / Self Care | Payer: BC Managed Care – PPO | Attending: Emergency Medicine | Admitting: Emergency Medicine

## 2021-11-01 ENCOUNTER — Encounter (HOSPITAL_BASED_OUTPATIENT_CLINIC_OR_DEPARTMENT_OTHER): Payer: Self-pay | Admitting: *Deleted

## 2021-11-01 ENCOUNTER — Other Ambulatory Visit: Payer: Self-pay

## 2021-11-01 DIAGNOSIS — R000 Tachycardia, unspecified: Secondary | ICD-10-CM | POA: Diagnosis not present

## 2021-11-01 DIAGNOSIS — J189 Pneumonia, unspecified organism: Secondary | ICD-10-CM

## 2021-11-01 DIAGNOSIS — R0602 Shortness of breath: Secondary | ICD-10-CM | POA: Insufficient documentation

## 2021-11-01 DIAGNOSIS — R062 Wheezing: Secondary | ICD-10-CM | POA: Diagnosis not present

## 2021-11-01 DIAGNOSIS — Z20822 Contact with and (suspected) exposure to covid-19: Secondary | ICD-10-CM | POA: Insufficient documentation

## 2021-11-01 DIAGNOSIS — R059 Cough, unspecified: Secondary | ICD-10-CM | POA: Insufficient documentation

## 2021-11-01 DIAGNOSIS — R509 Fever, unspecified: Secondary | ICD-10-CM | POA: Insufficient documentation

## 2021-11-01 DIAGNOSIS — Z7951 Long term (current) use of inhaled steroids: Secondary | ICD-10-CM | POA: Diagnosis not present

## 2021-11-01 DIAGNOSIS — J9801 Acute bronchospasm: Secondary | ICD-10-CM

## 2021-11-01 LAB — BASIC METABOLIC PANEL
Anion gap: 14 (ref 5–15)
BUN: 10 mg/dL (ref 8–23)
CO2: 23 mmol/L (ref 22–32)
Calcium: 9.5 mg/dL (ref 8.9–10.3)
Chloride: 101 mmol/L (ref 98–111)
Creatinine, Ser: 0.61 mg/dL (ref 0.44–1.00)
GFR, Estimated: >60 mL/min (ref >60)
Glucose, Bld: 137 mg/dL — ABNORMAL HIGH (ref 70–99)
Potassium: 3.7 mmol/L (ref 3.5–5.1)
Sodium: 138 mmol/L (ref 135–145)

## 2021-11-01 LAB — RESP PANEL BY RT-PCR (FLU A&B, COVID) ARPGX2
Influenza A by PCR: NEGATIVE
Influenza B by PCR: NEGATIVE
SARS Coronavirus 2 by RT PCR: NEGATIVE

## 2021-11-01 LAB — CBC WITH DIFFERENTIAL/PLATELET
Abs Immature Granulocytes: 0.11 10*3/uL — ABNORMAL HIGH (ref 0.00–0.07)
Basophils Absolute: 0.1 10*3/uL (ref 0.0–0.1)
Basophils Relative: 1 %
Eosinophils Absolute: 0.1 10*3/uL (ref 0.0–0.5)
Eosinophils Relative: 0 %
HCT: 37.9 % (ref 36.0–46.0)
Hemoglobin: 12.8 g/dL (ref 12.0–15.0)
Immature Granulocytes: 1 %
Lymphocytes Relative: 14 %
Lymphs Abs: 2.5 10*3/uL (ref 0.7–4.0)
MCH: 31.1 pg (ref 26.0–34.0)
MCHC: 33.8 g/dL (ref 30.0–36.0)
MCV: 92 fL (ref 80.0–100.0)
Monocytes Absolute: 1.7 10*3/uL — ABNORMAL HIGH (ref 0.1–1.0)
Monocytes Relative: 10 %
Neutro Abs: 13.2 10*3/uL — ABNORMAL HIGH (ref 1.7–7.7)
Neutrophils Relative %: 74 %
Platelets: 276 10*3/uL (ref 150–400)
RBC: 4.12 MIL/uL (ref 3.87–5.11)
RDW: 12.8 % (ref 11.5–15.5)
WBC: 17.6 10*3/uL — ABNORMAL HIGH (ref 4.0–10.5)
nRBC: 0 % (ref 0.0–0.2)

## 2021-11-01 MED ORDER — ALBUTEROL SULFATE HFA 108 (90 BASE) MCG/ACT IN AERS: 2.0000 | INHALATION_SPRAY | RESPIRATORY_TRACT | Status: DC

## 2021-11-01 MED ORDER — ALBUTEROL SULFATE (2.5 MG/3ML) 0.083% IN NEBU: 2.5000 mg | INHALATION_SOLUTION | Freq: Four times a day (QID) | RESPIRATORY_TRACT | 12 refills | Status: AC | PRN

## 2021-11-01 MED ORDER — DOXYCYCLINE HYCLATE 100 MG PO TABS: 100.0000 mg | ORAL_TABLET | Freq: Two times a day (BID) | ORAL | 0 refills | Status: DC

## 2021-11-01 MED ORDER — HYDROCODONE-ACETAMINOPHEN 7.5-325 MG/15ML PO SOLN
10.0000 mL | Freq: Once | ORAL | Status: AC
  Administered 2021-11-01: 10 mL via ORAL
  Filled 2021-11-01: qty 15

## 2021-11-01 MED ORDER — AEROCHAMBER PLUS FLO-VU LARGE MISC: 1.0000 | Freq: Once | Status: DC

## 2021-11-01 MED ORDER — PREDNISONE 50 MG PO TABS: 50.0000 mg | ORAL_TABLET | Freq: Every day | ORAL | 0 refills | Status: DC

## 2021-11-01 MED ORDER — ALBUTEROL SULFATE HFA 108 (90 BASE) MCG/ACT IN AERS: 2.0000 | INHALATION_SPRAY | RESPIRATORY_TRACT | Status: DC | PRN

## 2021-11-01 MED ORDER — IPRATROPIUM-ALBUTEROL 0.5-2.5 (3) MG/3ML IN SOLN
3.0000 mL | Freq: Once | RESPIRATORY_TRACT | Status: AC
  Administered 2021-11-01: 3 mL via RESPIRATORY_TRACT
  Filled 2021-11-01: qty 3

## 2021-11-01 MED ORDER — ALBUTEROL SULFATE HFA 108 (90 BASE) MCG/ACT IN AERS
INHALATION_SPRAY | RESPIRATORY_TRACT | Status: AC
  Administered 2021-11-01: 2 via RESPIRATORY_TRACT
  Filled 2021-11-01: qty 6.7

## 2021-11-01 MED ORDER — DEXAMETHASONE SODIUM PHOSPHATE 10 MG/ML IJ SOLN
10.0000 mg | Freq: Once | INTRAMUSCULAR | Status: AC
  Administered 2021-11-01: 10 mg via INTRAVENOUS
  Filled 2021-11-01: qty 1

## 2021-11-01 NOTE — ED Provider Notes (Signed)
[facility] EMERGENCY DEPT Provider Note   CSN: [id number] Arrival date & time: 11/01/21  0720     History {Add pertinent medical, surgical, social history, OB history to HPI:1} Chief Complaint  Patient presents with   Cough    Norma [name] is a 61 y.o. female.  HPI     This is a 61 year old female who presents with cough.  Patient reports 10-day history of worsening cough and shortness of breath.  She does smoke but states she has not smoked in several days.  She reports cough is productive of green sputum.  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  She noted a fever this morning to 101.9.  She took 800 mg of ibuprofen.  She has been using her inhaler with minimal relief.  Home Medications Prior to Admission medications   Medication Sig Start Date End Date Taking? Authorizing Provider  albuterol (VENTOLIN HFA) 108 (90 Base) MCG/ACT inhaler Inhale 2 puffs into the lungs every 6 (six) hours as needed for wheezing or shortness of breath.    [provider]  Ascorbic Acid (VITAMIN C PO) Take 1 tablet by mouth daily.    [provider]  buPROPion (WELLBUTRIN SR) 150 MG 12 hr tablet Take 150 mg by mouth daily.    [provider]  buPROPion (WELLBUTRIN XL) 300 MG 24 hr tablet Take 300 mg by mouth every morning. 09/16/21   [provider]  citalopram (CELEXA) 40 MG tablet Take 40 mg by mouth daily.    [provider]  clonazePAM (KLONOPIN) 1 MG tablet Take 1 mg by mouth at bedtime.     [provider]  fenofibrate 160 MG tablet Take 160 mg by mouth every evening.    [provider]  ibuprofen (ADVIL,MOTRIN) 800 MG tablet Take 1 tablet (800 mg total) by mouth 3 (three) times daily. 04/15/16   Long, Wonda Olds, MD  lidocaine (LIDODERM) 5 % Place 1 patch onto the skin daily. Remove & Discard patch within 12 hours or as directed by  MD 11/24/20   Sherwood Gambler, MD  Multiple Vitamin (MULTIVITAMIN) tablet Take 1 tablet by mouth daily.    [provider]  ondansetron (ZOFRAN ODT) 4 MG disintegrating tablet Take 1 tablet (4 mg total) by mouth every 8 (eight) hours as needed for nausea or vomiting. 08/12/20   Long, Wonda Olds, MD      Allergies    Codeine and Other    Review of Systems   Review of Systems  Constitutional:  Positive for fever.  Respiratory:  Positive for cough and shortness of breath.   Cardiovascular:  Negative for chest pain and leg swelling.  Genitourinary:  Negative for dysuria.  All other systems reviewed and are negative.   Physical Exam Updated Vital Signs BP (!) 165/95 (BP Location: Right Arm)   Pulse (!) 111   Temp 99.8 F (37.7 C) (Oral)   Resp (!) 24   SpO2 94%  Physical Exam Vitals and nursing note reviewed.  Constitutional:      Appearance: She is well-developed. She is obese. She is not ill-appearing.  HENT:     Head: Normocephalic and atraumatic.  Eyes:     Pupils: Pupils are equal, round, and reactive to light.  Cardiovascular:     Rate and Rhythm: Regular rhythm. Tachycardia present.     Heart sounds: Normal heart sounds.  Pulmonary:  Effort: Pulmonary effort is normal. No respiratory distress.     Breath sounds: Wheezing present.     Comments: Wheezing and rhonchorous breath sounds, fair air movement Abdominal:     Palpations: Abdomen is soft.  Musculoskeletal:     Cervical back: Neck supple.  Skin:    General: Skin is warm and dry.  Neurological:     Mental Status: She is alert and oriented to person, place, and time.  Psychiatric:        Mood and Affect: Mood normal.     ED Results / Procedures / Treatments   Labs (all labs ordered are listed, but only abnormal results are displayed) Labs Reviewed  RESP PANEL BY RT-PCR (FLU A&B, COVID) ARPGX2  CBC WITH DIFFERENTIAL/PLATELET  BASIC METABOLIC PANEL    EKG None  Radiology No results  found.  Procedures Procedures  {Document cardiac monitor, telemetry assessment procedure when appropriate:1}  Medications Ordered in ED Medications  ipratropium-albuterol (DUONEB) 0.5-2.5 (3) MG/3ML nebulizer solution 3 mL (has no administration in time range)  dexamethasone (DECADRON) injection 10 mg (has no administration in time range)    ED Course/ Medical Decision Making/ A&P                           Medical Decision Making Amount and/or Complexity of Data Reviewed Labs: ordered. Radiology: ordered.  Risk Prescription drug management.   This patient presents to the ED for concern of ***, this involves an extensive number of treatment options, and is a complaint that carries with it a high risk of complications and morbidity.  I considered the following differential and admission for this acute, potentially life threatening condition.  The differential diagnosis includes ***  MDM:    ***  (Labs, imaging, consults)  Labs: I Ordered, and personally interpreted labs.  The pertinent results include:  ***  Imaging Studies ordered: I ordered imaging studies including *** I independently visualized and interpreted imaging. I agree with the radiologist interpretation  Additional history obtained from ***.  External records from outside source obtained and reviewed including ***  Cardiac Monitoring: The patient was maintained on a cardiac monitor.  I personally viewed and interpreted the cardiac monitored which showed an underlying rhythm of: ***  Reevaluation: After the interventions noted above, I reevaluated the patient and found that they have :{resolved/improved/worsened:23923::"improved"}  Social Determinants of Health: ***  Disposition:  ***  Co morbidities that complicate the patient evaluation  Past Medical History:  Diagnosis Date   Anxiety    Depression    PONV (postoperative nausea and vomiting)    Pre-diabetes    Snores    Volar retinacular  ganglion      Medicines Meds ordered this encounter  Medications   ipratropium-albuterol (DUONEB) 0.5-2.5 (3) MG/3ML nebulizer solution 3 mL   dexamethasone (DECADRON) injection 10 mg    I have reviewed the patients home medicines and have made adjustments as needed  Problem List / ED Course: Problem List Items Addressed This Visit   None         {Document critical care time when appropriate:1} {Document review of labs and clinical decision tools ie heart score, Chads2Vasc2 etc:1}  {Document your independent review of radiology images, and any outside records:1} {Document your discussion with family members, caretakers, and with consultants:1} {Document social determinants of health affecting pt's care:1} {Document your decision making why or why not admission, treatments were needed:1} Final Clinical Impression(s) / ED Diagnoses Final  diagnoses:  None    Rx / DC Orders ED Discharge Orders     None

## 2021-11-01 NOTE — ED Triage Notes (Signed)
Pt is here for productive cough with fever. Pt reports that she began feeling unwell 10 days ago with a head cold and now has productive cough with green sputum as well as fever of 101.64F this am (she took 800mg  ibuprofen).

## 2021-11-01 NOTE — ED Notes (Signed)
Patient transported to X-ray 

## 2021-11-01 NOTE — ED Notes (Signed)
RT note: Pt. seen after arrival to [facility] for PC w/>'d fever, ordered X1 Duoneb which was given via HHN, RT assessment obtained, given replacement Albuterol Inhaler with X2 puffs with spacer device to go to use Q4-6 hrs. as needed along with Flutter, (Mucus Clearance Device) due to large # yellow/Green amount of mucus, Education done with questions answered, RT to monitor.

## 2021-11-01 NOTE — ED Provider Notes (Signed)
Patient initially seen by Dr. Dina Rich plan.  Please see her note.  Patient's laboratory tests are notable for leukocytosis.  Metabolic panel was otherwise unremarkable.  COVID and flu were negative.  Chest x-ray images and radiology report personally reviewed.  Findings suggestive of pneumonia.  Patient was treated with nebulizer treatment and she is feeling better.  Appears to have a component of bronchospasm associated with her pneumonia.  Will prescribe a course of steroids, patient was given albuterol Hailer as well as a prescription for nebulizer solution, we will also prescribe a course of doxycycline.  Patient is feeling better and is ready for discharge   Dorie Rank, MD 11/01/21 [phone]

## 2021-11-01 NOTE — Discharge Instructions (Signed)
Take the medications as prescribed to help with your cough and wheezing.  Follow-up with your doctor to be rechecked to make sure the symptoms resolved.  Return as needed for worsening symptoms

## 2021-11-03 ENCOUNTER — Ambulatory Visit: Payer: BC Managed Care – PPO | Admitting: Internal Medicine

## 2021-11-03 NOTE — Progress Notes (Deleted)
Cardiology Office Note:    Date:  11/03/2021   ID:  Norma [name], DOB 18-Jun-1960, MRN [id number]  PCP:  Josetta Huddle, [place] Providers Cardiologist:  None { Click to update primary MD,subspecialty MD or APP then REFRESH:1}    Referring MD: Josetta Huddle, MD   No chief complaint on file. ***  History of Present Illness:    Norma [name] is a 61 y.o. female with a hx of anxiety, referral for chest pain from the ED. She notes it as a tightness with radiation the L scapula, this was 9/19. She notes this is intermittent. EKG on 9/19 showed NSR, no ST changes. Trops were negative.  Past Medical History:  Diagnosis Date   Anxiety    Depression    PONV (postoperative nausea and vomiting)    Pre-diabetes    Snores    Volar retinacular ganglion     Past Surgical History:  Procedure Laterality Date   ABDOMINAL HYSTERECTOMY     ANTERIOR CERVICAL DECOMP/DISCECTOMY FUSION N/A 04/10/2013   Procedure: ANTERIOR CERVICAL DECOMPRESSION/DISCECTOMY FUSION 1 LEVEL;  Surgeon: Sinclair Ship, MD;  Location: [facility];  Service: Orthopedics;  Laterality: N/A;  Anterior cervical decompression fusion, cervical 6-7 with instrumentation and allograft   APPENDECTOMY     BARTHOLIN CYST MARSUPIALIZATION N/A 03/07/2013   Procedure: BARTHOLIN CYST MARSUPIALIZATION;  Surgeon: Annalee Genta, DO;  Location: [facility];  Service: Gynecology;  Laterality: N/A;   CHOLECYSTECTOMY N/A 09/02/2020   Procedure: LAPAROSCOPIC CHOLECYSTECTOMY;  Surgeon: Coralie Keens, MD;  Location: [facility];  Service: General;  Laterality: N/A;   COLONOSCOPY     COLONOSCOPY WITH PROPOFOL N/A 06/15/2016   Procedure: COLONOSCOPY WITH PROPOFOL;  Surgeon: Garlan Fair, MD;  Location: [facility];  Service: Endoscopy;  Laterality: N/A;   INCISION AND DRAINAGE HIP     RT   REDUCTION MAMMAPLASTY Bilateral 1978   SHOULDER ARTHROSCOPY     RT   WRIST GANGLION EXCISION     X2 RT WRIST, X1 LT WRIST    Current  Medications: No outpatient medications have been marked as taking for the 11/03/21 encounter (Appointment) with Janina Mayo, MD.     Allergies:   Codeine and Other   Social History   Socioeconomic History   Marital status: Married    Spouse name: Not on file   Number of children: Not on file   Years of education: Not on file   Highest education level: Not on file  Occupational History   Not on file  Tobacco Use   Smoking status: Every Day    Packs/day: 0.25    Years: 46.00    Total pack years: 11.50    Types: Cigarettes   Smokeless tobacco: Never  Vaping Use   Vaping Use: Never used  Substance and Sexual Activity   Alcohol use: Yes    Comment: SOCIAL   Drug use: No   Sexual activity: Not on file  Other Topics Concern   Not on file  Social History Narrative   Not on file   Social Determinants of Health   Financial Resource Strain: Not on file  Food Insecurity: Not on file  Transportation Needs: Not on file  Physical Activity: Not on file  Stress: Not on file  Social Connections: Not on file     Family History: The patient's ***family history includes Cancer in her father and mother. There is no history of Breast cancer.  ROS:   Please  see the history of present illness.    *** All other systems reviewed and are negative.  EKGs/Labs/Other Studies Reviewed:    The following studies were reviewed today: ***  EKG:  EKG is *** ordered today.  The ekg ordered today demonstrates ***  Recent Labs: 11/01/2021: BUN 10; Creatinine, Ser 0.61; Hemoglobin 12.8; Platelets 276; Potassium 3.7; Sodium 138  Recent Lipid Panel No results found for: "CHOL", "TRIG", "HDL", "CHOLHDL", "VLDL", "LDLCALC", "LDLDIRECT"   Risk Assessment/Calculations:   {Does this patient have ATRIAL FIBRILLATION?:[phone]}  No BP recorded.  {Refresh Note OR Click here to enter BP  :1}***         Physical Exam:    VS:  There were no vitals taken for this visit.    Wt Readings from  Last 3 Encounters:  10/12/21 220 lb 0.3 oz (99.8 kg)  11/24/20 220 lb 0.3 oz (99.8 kg)  11/17/20 220 lb 0.3 oz (99.8 kg)     GEN: *** Well nourished, well developed in no acute distress HEENT: Normal NECK: No JVD; No carotid bruits LYMPHATICS: No lymphadenopathy CARDIAC: ***RRR, no murmurs, rubs, gallops RESPIRATORY:  Clear to auscultation without rales, wheezing or rhonchi  ABDOMEN: Soft, non-tender, non-distended MUSCULOSKELETAL:  No edema; No deformity  SKIN: Warm and dry NEUROLOGIC:  Alert and oriented x 3 PSYCHIATRIC:  Normal affect   ASSESSMENT:    No diagnosis found. PLAN:    In order of problems listed above:  Coronary CTA***      {Are you ordering a CV Procedure (e.g. stress test, cath, DCCV, TEE, etc)?   Press F2        :[id number]}    Medication Adjustments/Labs and Tests Ordered: Current medicines are reviewed at length with the patient today.  Concerns regarding medicines are outlined above.  No orders of the defined types were placed in this encounter.  No orders of the defined types were placed in this encounter.   There are no Patient Instructions on file for this visit.   Signed, Maisie Fus, MD  11/03/2021 7:26 AM    [facility]

## 2021-11-18 ENCOUNTER — Encounter: Payer: Self-pay | Admitting: *Deleted

## 2021-12-15 IMAGING — CT CT CHEST W/O CM
1 series · 15 of 34 positions shown, 19 images · non-contrast
Comparison: Chest CT dated 02/13/2018

CLINICAL DATA: Follow-up pulmonary nodules.

EXAM:
CT CHEST WITHOUT CONTRAST
TECHNIQUE: Multidetector CT imaging of the chest was performed following the
standard protocol without IV contrast.

[Series 2: chest w/(date) · axial · 0.84mm/px · z∈[-327,-87]mm · 15 of 142 slices shown, 19 images]
[im 11/142  mediastinal]
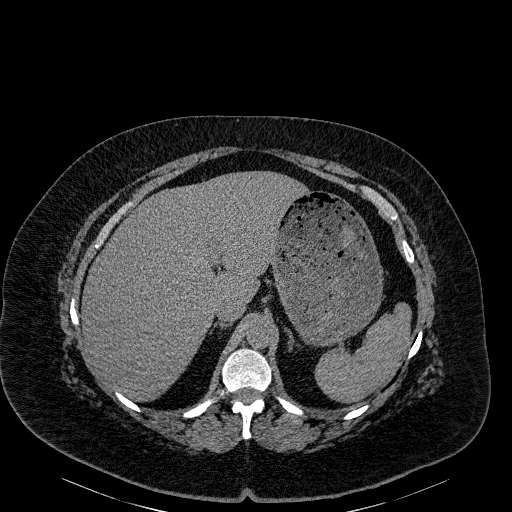
[im 11/142  lung]
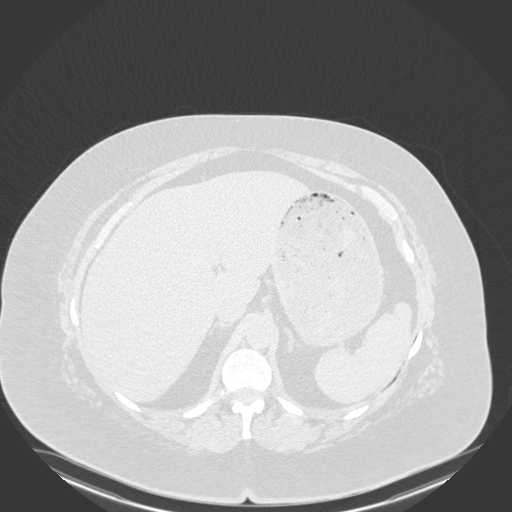
[im 21/142  lung]
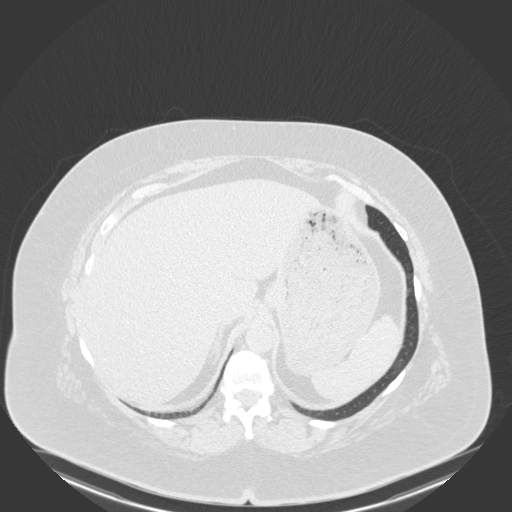
[im 29/142  lung]
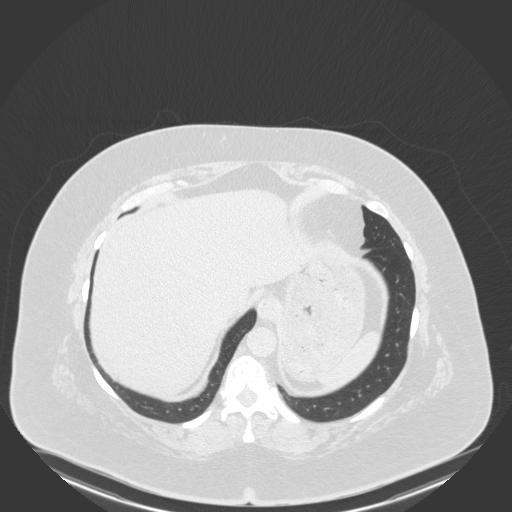
[im 37/142  lung]
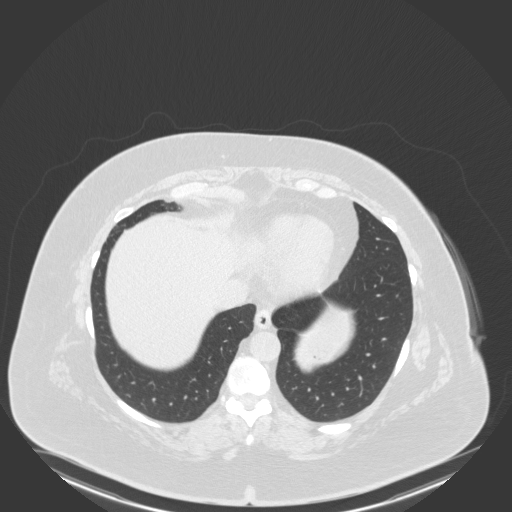
[im 48/142  mediastinal]
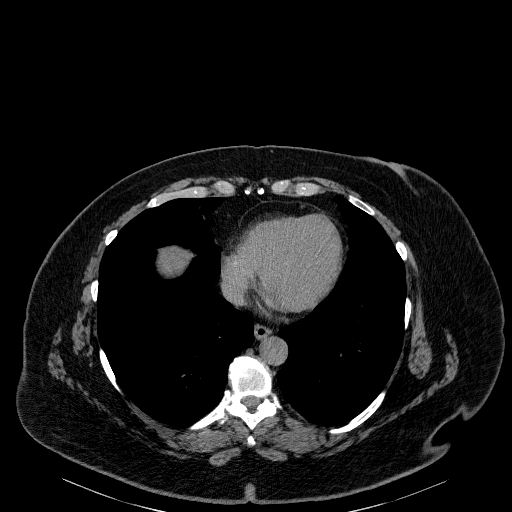
[im 48/142  lung]
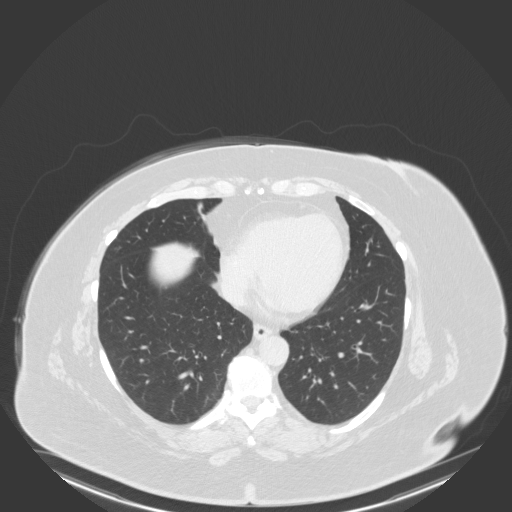
[im 57/142  lung]
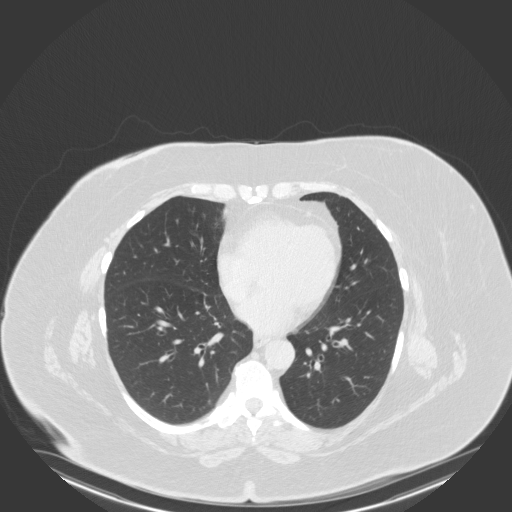
[im 63/142  lung]
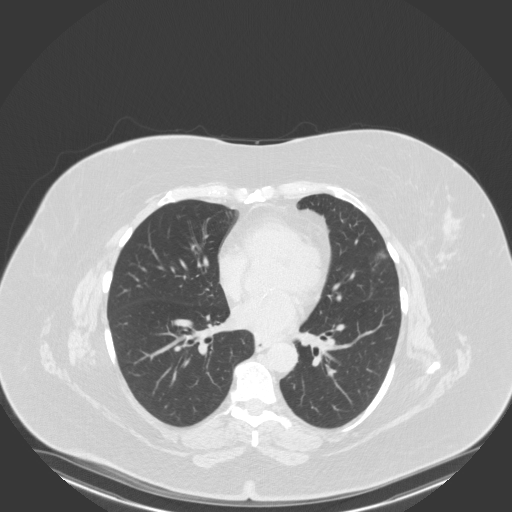
[im 74/142  lung]
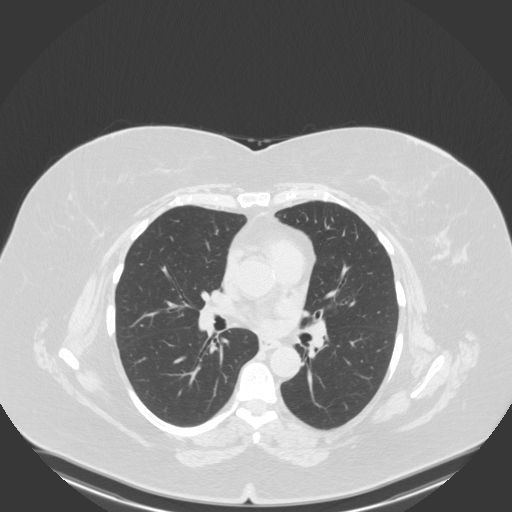
[im 79/142  mediastinal]
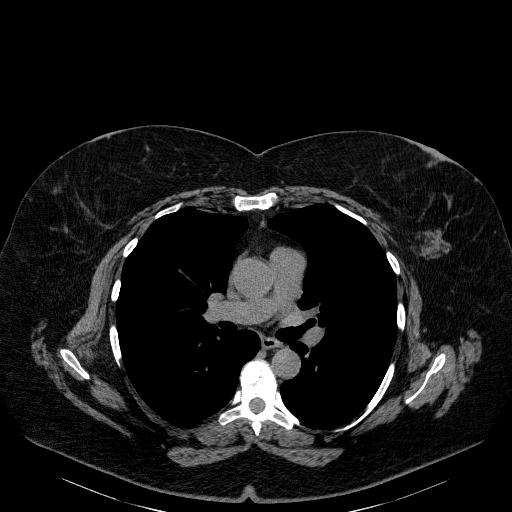
[im 79/142  lung]
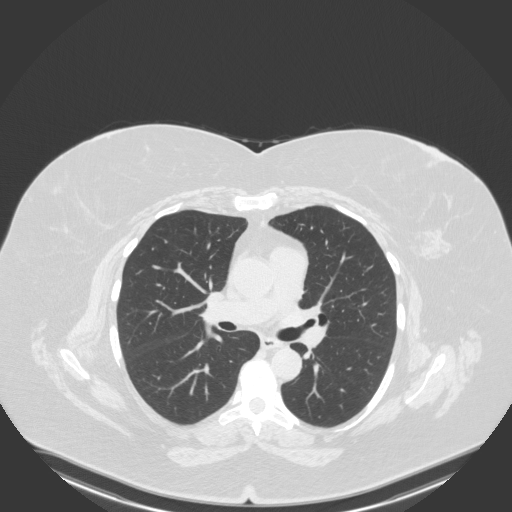
[im 85/142  lung]
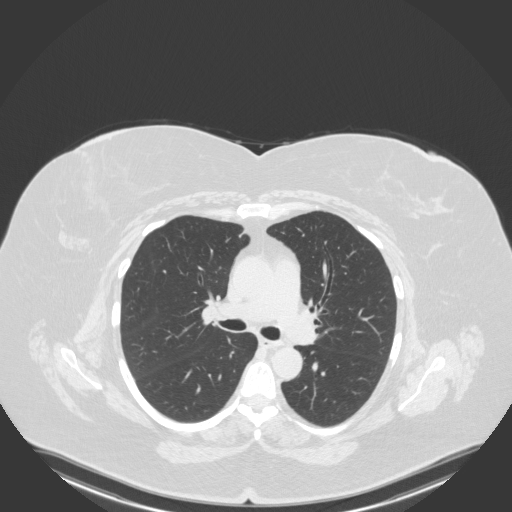
[im 95/142  lung]
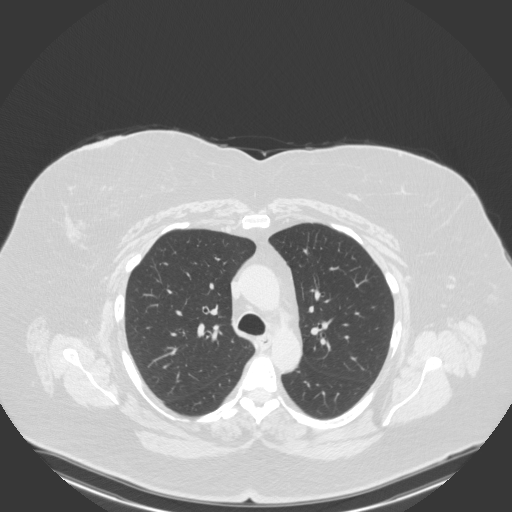
[im 105/142  lung]
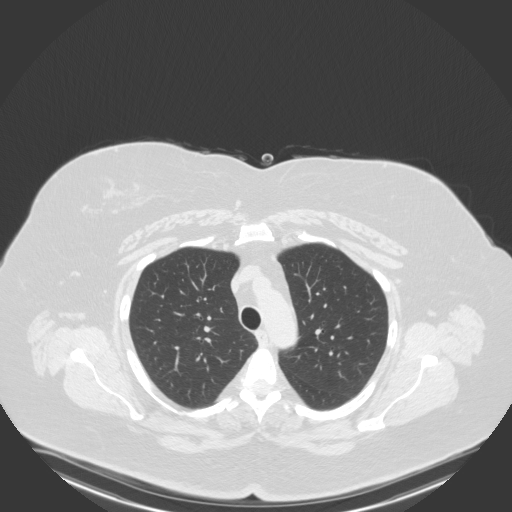
[im 113/142  mediastinal]
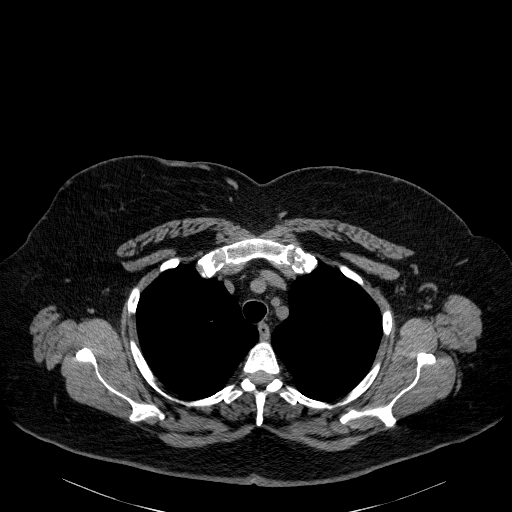
[im 113/142  lung]
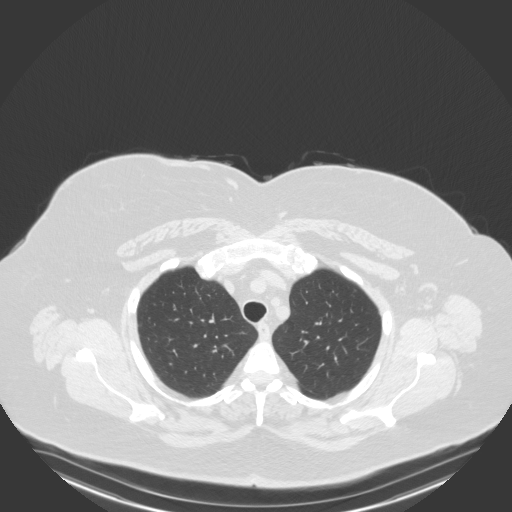
[im 121/142  lung]
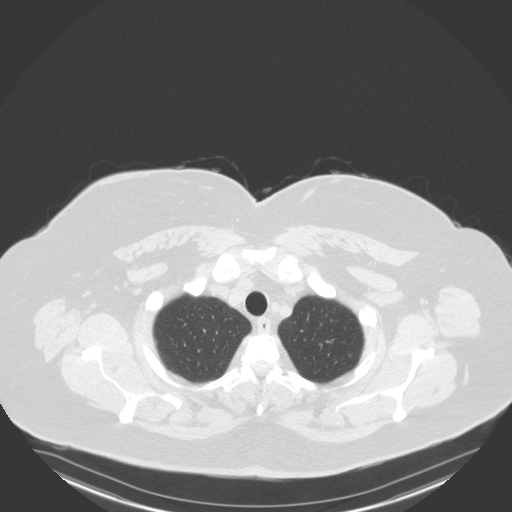
[im 131/142  lung]
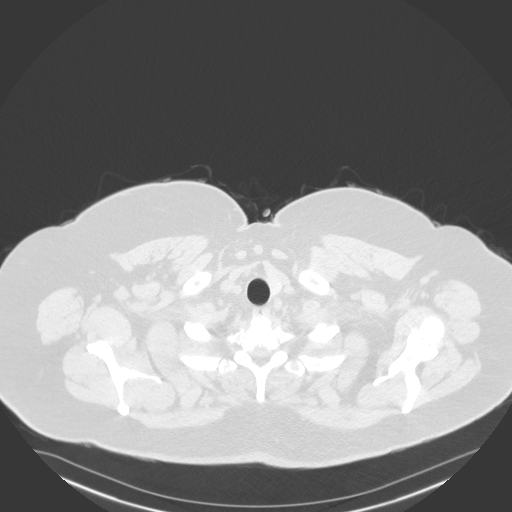

[15 of 34 positions shown; findings below may reference images not displayed]

FINDINGS: Cardiovascular: No thoracic aortic aneurysm. No pericardial
effusion.

Mediastinum/Nodes: No mass or enlarged lymph nodes within the
mediastinum or perihilar regions. Esophagus is unremarkable. Trachea
and central bronchi are unremarkable.

Lungs/Pleura: 2 mm nodule within the RIGHT upper lobe is stable
(series 5, image 52). Stable mild scarring/atelectasis at each lung
base. No new lung findings. No suspicious nodule or mass. No pleural
effusion or pneumothorax.

Upper Abdomen: Limited images of the upper abdomen are unremarkable.

Musculoskeletal: Mild degenerative spondylosis of the slightly
scoliotic thoracic spine. No acute or suspicious osseous
abnormality.
IMPRESSION: 1. Stable 2 mm nodule within the RIGHT upper lobe. Stable mild
scarring/atelectasis at each lung base. No additional follow-up
imaging is necessary for these benign findings.
2. No acute findings.  No suspicious pulmonary nodule or mass.

## 2021-12-23 ENCOUNTER — Ambulatory Visit: Payer: BC Managed Care – PPO

## 2022-01-18 DIAGNOSIS — L309 Dermatitis, unspecified: Secondary | ICD-10-CM | POA: Diagnosis not present

## 2022-02-05 DIAGNOSIS — M545 Low back pain, unspecified: Secondary | ICD-10-CM | POA: Diagnosis not present

## 2022-03-07 DIAGNOSIS — D485 Neoplasm of uncertain behavior of skin: Secondary | ICD-10-CM | POA: Diagnosis not present

## 2022-03-07 DIAGNOSIS — L308 Other specified dermatitis: Secondary | ICD-10-CM | POA: Diagnosis not present

## 2022-03-07 DIAGNOSIS — L409 Psoriasis, unspecified: Secondary | ICD-10-CM | POA: Diagnosis not present

## 2022-04-08 DIAGNOSIS — J019 Acute sinusitis, unspecified: Secondary | ICD-10-CM | POA: Diagnosis not present

## 2022-04-08 DIAGNOSIS — J209 Acute bronchitis, unspecified: Secondary | ICD-10-CM | POA: Diagnosis not present

## 2022-04-12 ENCOUNTER — Ambulatory Visit (INDEPENDENT_AMBULATORY_CARE_PROVIDER_SITE_OTHER): Payer: BC Managed Care – PPO

## 2022-04-12 ENCOUNTER — Ambulatory Visit
Admission: EM | Admit: 2022-04-12 | Discharge: 2022-04-12 | Disposition: A | Discharge status: Home / Self Care | Payer: BC Managed Care – PPO | Attending: Internal Medicine | Admitting: Internal Medicine

## 2022-04-12 ENCOUNTER — Encounter: Payer: Self-pay | Admitting: Emergency Medicine

## 2022-04-12 DIAGNOSIS — R059 Cough, unspecified: Secondary | ICD-10-CM

## 2022-04-12 DIAGNOSIS — R051 Acute cough: Secondary | ICD-10-CM

## 2022-04-12 DIAGNOSIS — J018 Other acute sinusitis: Secondary | ICD-10-CM | POA: Diagnosis not present

## 2022-04-12 MED ORDER — DOXYCYCLINE HYCLATE 100 MG PO CAPS: 100.0000 mg | ORAL_CAPSULE | Freq: Two times a day (BID) | ORAL | 0 refills | Status: DC

## 2022-04-12 NOTE — Discharge Instructions (Signed)
X-ray shows inflammation in your chest resembling bronchitis.  Continue and complete prednisone.  Stop Augmentin.  Start doxycycline.  Take this with food.  Follow-up with ENT specialty if symptoms persist or worsen.

## 2022-04-12 NOTE — ED Triage Notes (Signed)
Pt was treated for bronchitis 1 week ago. Was given Augmentin on Thursday. Starting on Saturday she began having left facial pain left teeth pain and left throat pain. Pt said hurts to touch the left side of her face. Still on the Augmentin.

## 2022-04-12 NOTE — ED Provider Notes (Signed)
[facility]    CSN: [id number] Arrival date & time: 04/12/22  1738      History   Chief Complaint Chief Complaint  Patient presents with   Facial Pain    HPI CAPPIE GLOCKNER is a 62 y.o. female.   Patient presents with worsening left-sided facial pain and nasal congestion.  Reports that she has had 7 days of nasal congestion, cough, sore throat.  Was seen at a different urgent care on 3/15 where she was prescribed prednisone, Augmentin, Flonase for treatment for bronchitis.  She reports that she seen some improvement in her chest congestion and cough with medications but that nasal congestion has seemed to worsen.  She has purulent drainage from the left nare and left-sided facial pain is worse.  Denies any recent fevers.  Denies chest pain or shortness of breath.     Past Medical History:  Diagnosis Date   Anxiety    Depression    PONV (postoperative nausea and vomiting)    Pre-diabetes    Snores    Volar retinacular ganglion     Patient Active Problem List   Diagnosis Date Noted   Symptomatic mammary hypertrophy 11/06/2020   Back pain 11/06/2020   Neck pain 11/06/2020   Symptomatic cholelithiasis 09/01/2020   Snoring 08/06/2019   Witnessed episode of apnea 08/06/2019   Excessive sleepiness 08/06/2019   Fatigue 08/06/2019   Insomnia 08/06/2019   Radiculopathy 04/10/2013    Past Surgical History:  Procedure Laterality Date   ABDOMINAL HYSTERECTOMY     ANTERIOR CERVICAL DECOMP/DISCECTOMY FUSION N/A 04/10/2013   Procedure: ANTERIOR CERVICAL DECOMPRESSION/DISCECTOMY FUSION 1 LEVEL;  Surgeon: Sinclair Ship, MD;  Location: [facility];  Service: Orthopedics;  Laterality: N/A;  Anterior cervical decompression fusion, cervical 6-7 with instrumentation and allograft   APPENDECTOMY     BARTHOLIN CYST MARSUPIALIZATION N/A 03/07/2013   Procedure: BARTHOLIN CYST MARSUPIALIZATION;  Surgeon: Annalee Genta, DO;  Location: [facility];  Service: Gynecology;  Laterality:  N/A;   CHOLECYSTECTOMY N/A 09/02/2020   Procedure: LAPAROSCOPIC CHOLECYSTECTOMY;  Surgeon: Coralie Keens, MD;  Location: [facility];  Service: General;  Laterality: N/A;   COLONOSCOPY     COLONOSCOPY WITH PROPOFOL N/A 06/15/2016   Procedure: COLONOSCOPY WITH PROPOFOL;  Surgeon: Garlan Fair, MD;  Location: [facility];  Service: Endoscopy;  Laterality: N/A;   INCISION AND DRAINAGE HIP     RT   REDUCTION MAMMAPLASTY Bilateral 1978   SHOULDER ARTHROSCOPY     RT   WRIST GANGLION EXCISION     X2 RT WRIST, X1 LT WRIST    OB History     Gravida  2   Para  2   Term      Preterm      AB      Living  2      SAB      IAB      Ectopic      Multiple      Live Births               Home Medications    Prior to Admission medications   Medication Sig Start Date End Date Taking? Authorizing Provider  doxycycline (VIBRAMYCIN) 100 MG capsule Take 1 capsule (100 mg total) by mouth 2 (two) times daily. 04/12/22  Yes Ellena Kamen, Hildred Alamin E, FNP  albuterol (PROVENTIL) (2.5 MG/3ML) 0.083% nebulizer solution Take 3 mLs (2.5 mg total) by nebulization every 6 (six) hours as needed for wheezing or shortness of breath.  11/01/21   Dorie Rank, MD  albuterol (VENTOLIN HFA) 108 (90 Base) MCG/ACT inhaler Inhale 2 puffs into the lungs every 6 (six) hours as needed for wheezing or shortness of breath.    [provider]  Ascorbic Acid (VITAMIN C PO) Take 1 tablet by mouth daily.    [provider]  buPROPion (WELLBUTRIN SR) 150 MG 12 hr tablet Take 150 mg by mouth daily.    [provider]  buPROPion (WELLBUTRIN XL) 300 MG 24 hr tablet Take 300 mg by mouth every morning. 09/16/21   [provider]  citalopram (CELEXA) 40 MG tablet Take 40 mg by mouth daily.    [provider]  clonazePAM (KLONOPIN) 1 MG tablet Take 1 mg by mouth at bedtime.     [provider]  fenofibrate 160 MG tablet Take 160 mg by mouth every evening.    [provider]  ibuprofen (ADVIL,MOTRIN) 800 MG tablet Take 1 tablet (800 mg total) by mouth 3 (three) times daily. 04/15/16   Long, Wonda Olds, MD  lidocaine (LIDODERM) 5 % Place 1 patch onto the skin daily. Remove & Discard patch within 12 hours or as directed by MD 11/24/20   Sherwood Gambler, MD  Multiple Vitamin (MULTIVITAMIN) tablet Take 1 tablet by mouth daily.    [provider]  ondansetron (ZOFRAN ODT) 4 MG disintegrating tablet Take 1 tablet (4 mg total) by mouth every 8 (eight) hours as needed for nausea or vomiting. 08/12/20   Long, Wonda Olds, MD  predniSONE (DELTASONE) 50 MG tablet Take 1 tablet (50 mg total) by mouth daily. 11/01/21   Dorie Rank, MD    Family History Family History  Problem Relation Age of Onset   Cancer Mother    Cancer Father    Breast cancer Neg Hx     Social History Social History   Tobacco Use   Smoking status: Every Day    Packs/day: 0.25    Years: 46.00    Additional pack years: 0.00    Total pack years: 11.50    Types: Cigarettes   Smokeless tobacco: Never  Vaping Use   Vaping Use: Never used  Substance Use Topics   Alcohol use: Yes    Comment: SOCIAL   Drug use: No     Allergies   Codeine and Other   Review of Systems Review of Systems Per HPI  Physical Exam Triage Vital Signs ED Triage Vitals  Enc Vitals Group     BP 04/12/22 1753 (!) 154/97     Pulse Rate 04/12/22 1753 80     Resp 04/12/22 1753 16     Temp 04/12/22 1753 97.8 F (36.6 C)     Temp Source 04/12/22 1753 Oral     SpO2 04/12/22 1753 97 %     Weight --      Height --      Head Circumference --      Peak Flow --      Pain Score 04/12/22 1752 7     Pain Loc --      Pain Edu? --      Excl. in [place]? --    No data found.  Updated Vital Signs BP (!) 154/97 (BP Location: Left Arm)   Pulse 80   Temp 97.8 F (36.6 C) (Oral)   Resp 16   SpO2 97%   Visual Acuity Right Eye Distance:   Left Eye Distance:   Bilateral Distance:    Right  Eye Near:   Left Eye Near:     Bilateral Near:     Physical Exam Constitutional:      General: She is not in acute distress.    Appearance: Normal appearance. She is not toxic-appearing or diaphoretic.  HENT:     Head: Normocephalic and atraumatic.     Right Ear: Tympanic membrane and ear canal normal.     Left Ear: Tympanic membrane and ear canal normal.     Nose: Congestion and rhinorrhea present. Rhinorrhea is purulent.     Left Sinus: Maxillary sinus tenderness and frontal sinus tenderness present.     Mouth/Throat:     Mouth: Mucous membranes are moist.     Pharynx: No posterior oropharyngeal erythema.  Eyes:     Extraocular Movements: Extraocular movements intact.     Conjunctiva/sclera: Conjunctivae normal.     Pupils: Pupils are equal, round, and reactive to light.  Cardiovascular:     Rate and Rhythm: Normal rate and regular rhythm.     Pulses: Normal pulses.     Heart sounds: Normal heart sounds.  Pulmonary:     Effort: Pulmonary effort is normal. No respiratory distress.     Breath sounds: No stridor. Rhonchi present. No wheezing or rales.  Chest:     Chest wall: No tenderness.  Abdominal:     General: Abdomen is flat. Bowel sounds are normal.     Palpations: Abdomen is soft.  Musculoskeletal:        General: Normal range of motion.     Cervical back: Normal range of motion.  Skin:    General: Skin is warm and dry.  Neurological:     General: No focal deficit present.     Mental Status: She is alert and oriented to person, place, and time. Mental status is at baseline.  Psychiatric:        Mood and Affect: Mood normal.        Behavior: Behavior normal.      UC Treatments / Results  Labs (all labs ordered are listed, but only abnormal results are displayed) Labs Reviewed - No data to display  EKG   Radiology DG Chest 2 View  Result Date: 04/12/2022 CLINICAL DATA:  Cough for 1 week. Recent diagnosis of bronchitis. EXAM: CHEST - 2 VIEW COMPARISON:  11/01/2021 FINDINGS: The  cardiomediastinal contours are normal. Mild peribronchial thickening. Minor subsegmental scarring in the left mid lung. Pulmonary vasculature is normal. No consolidation, pleural effusion, or pneumothorax. No acute osseous abnormalities are seen. IMPRESSION: Mild peribronchial thickening, can be seen with bronchitis or asthma. No focal consolidation. Electronically Signed   By: Keith Rake M.D.   On: 04/12/2022 18:23    Procedures Procedures (including critical care time)  Medications Ordered in UC Medications - No data to display  Initial Impression / Assessment and Plan / UC Course  I have reviewed the triage vital signs and the nursing notes.  Pertinent labs & imaging results that were available during my care of the patient were reviewed by me and considered in my medical decision making (see chart for details).     Chest x-ray completed given adventitious lung sounds on exam.  It shows signs of bronchitis.  Patient to complete prednisone.  She also reports that she has albuterol nebulizer treatments at home and encouraged her to use these as I do think it will be helpful.  No signs of tachypnea or low oxygen on exam so do not think that emergent  evaluation is necessary.  Patient also appears to have a sinus infection given increased left facial pain and purulent drainage from the nose.  Patient to stop Augmentin given that this has not been helpful and start doxycycline.  Advised her to take this with food.  Advised strict return and ER precautions.  Advised supportive care.  Patient verbalized understanding and was agreeable with plan. Final Clinical Impressions(s) / UC Diagnoses   Final diagnoses:  Acute non-recurrent sinusitis of other sinus  Acute cough     Discharge Instructions      X-ray shows inflammation in your chest resembling bronchitis.  Continue and complete prednisone.  Stop Augmentin.  Start doxycycline.  Take this with food.  Follow-up with ENT specialty if  symptoms persist or worsen.     ED Prescriptions     Medication Sig Dispense Auth. Provider   doxycycline (VIBRAMYCIN) 100 MG capsule Take 1 capsule (100 mg total) by mouth 2 (two) times daily. 20 capsule Teodora Medici, [place]      PDMP not reviewed this encounter.   Teodora Medici, [place] 04/12/22 1901

## 2022-04-25 ENCOUNTER — Ambulatory Visit
Admission: EM | Admit: 2022-04-25 | Discharge: 2022-04-25 | Disposition: A | Discharge status: Home / Self Care | Payer: BC Managed Care – PPO | Attending: Internal Medicine | Admitting: Internal Medicine

## 2022-04-25 DIAGNOSIS — R590 Localized enlarged lymph nodes: Secondary | ICD-10-CM | POA: Diagnosis not present

## 2022-04-25 DIAGNOSIS — J029 Acute pharyngitis, unspecified: Secondary | ICD-10-CM

## 2022-04-25 LAB — POCT RAPID STREP A (OFFICE): Rapid Strep A Screen: NEGATIVE

## 2022-04-25 LAB — POCT MONO SCREEN (KUC): Mono, POC: NEGATIVE

## 2022-04-25 NOTE — Discharge Instructions (Signed)
Rapid strep and rapid mono were negative.  Throat culture is pending.  Will call if it is abnormal.  Lymph node swelling is most likely benign and due to inflammation and infection.  Although, blood work is pending to rule out anything worrisome.  I recommend that you make an appointment with primary care doctor for further evaluation and management.  If symptoms persist or worsen, please go to the emergency department in the meantime.

## 2022-04-25 NOTE — ED Triage Notes (Signed)
Pt c/o feeling like she is "swallowing a golf ball" on the left side of her neck. Reports recently being seen at [facility] and sxs improved but this sensation has lingered. Did completed abx.

## 2022-04-25 NOTE — ED Provider Notes (Signed)
[facility]    CSN: [id number] Arrival date & time: 04/25/22  1417      History   Chief Complaint Chief Complaint  Patient presents with   golf ball sensation    HPI Norma [name] is a 62 y.o. female.   Patient presents with a description of a "golf ball like sensation" on the left side of her neck when swallowing.  Reports her throat is also sore.  The symptoms started about 4 days ago.  Patient was recently treated for sinus infection and cough with a course of Augmentin and prednisone.  Then, she was treated with doxycycline at urgent care visit on 3/19.  She reports all of those symptoms resolved, and these are new symptoms.  Denies any recent fevers.  Denies chest pain or shortness of breath.     Past Medical History:  Diagnosis Date   Anxiety    Depression    PONV (postoperative nausea and vomiting)    Pre-diabetes    Snores    Volar retinacular ganglion     Patient Active Problem List   Diagnosis Date Noted   Symptomatic mammary hypertrophy 11/06/2020   Back pain 11/06/2020   Neck pain 11/06/2020   Symptomatic cholelithiasis 09/01/2020   Snoring 08/06/2019   Witnessed episode of apnea 08/06/2019   Excessive sleepiness 08/06/2019   Fatigue 08/06/2019   Insomnia 08/06/2019   Radiculopathy 04/10/2013    Past Surgical History:  Procedure Laterality Date   ABDOMINAL HYSTERECTOMY     ANTERIOR CERVICAL DECOMP/DISCECTOMY FUSION N/A 04/10/2013   Procedure: ANTERIOR CERVICAL DECOMPRESSION/DISCECTOMY FUSION 1 LEVEL;  Surgeon: Sinclair Ship, MD;  Location: [facility];  Service: Orthopedics;  Laterality: N/A;  Anterior cervical decompression fusion, cervical 6-7 with instrumentation and allograft   APPENDECTOMY     BARTHOLIN CYST MARSUPIALIZATION N/A 03/07/2013   Procedure: BARTHOLIN CYST MARSUPIALIZATION;  Surgeon: Annalee Genta, DO;  Location: [facility];  Service: Gynecology;  Laterality: N/A;   CHOLECYSTECTOMY N/A 09/02/2020   Procedure: LAPAROSCOPIC  CHOLECYSTECTOMY;  Surgeon: Coralie Keens, MD;  Location: [facility];  Service: General;  Laterality: N/A;   COLONOSCOPY     COLONOSCOPY WITH PROPOFOL N/A 06/15/2016   Procedure: COLONOSCOPY WITH PROPOFOL;  Surgeon: Garlan Fair, MD;  Location: [facility];  Service: Endoscopy;  Laterality: N/A;   INCISION AND DRAINAGE HIP     RT   REDUCTION MAMMAPLASTY Bilateral 1978   SHOULDER ARTHROSCOPY     RT   WRIST GANGLION EXCISION     X2 RT WRIST, X1 LT WRIST    OB History     Gravida  2   Para  2   Term      Preterm      AB      Living  2      SAB      IAB      Ectopic      Multiple      Live Births               Home Medications    Prior to Admission medications   Medication Sig Start Date End Date Taking? Authorizing Provider  albuterol (PROVENTIL) (2.5 MG/3ML) 0.083% nebulizer solution Take 3 mLs (2.5 mg total) by nebulization every 6 (six) hours as needed for wheezing or shortness of breath. 11/01/21   Dorie Rank, MD  albuterol (VENTOLIN HFA) 108 (90 Base) MCG/ACT inhaler Inhale 2 puffs into the lungs every 6 (six) hours as needed for wheezing or  shortness of breath.    [provider]  Ascorbic Acid (VITAMIN C PO) Take 1 tablet by mouth daily.    [provider]  buPROPion (WELLBUTRIN SR) 150 MG 12 hr tablet Take 150 mg by mouth daily.    [provider]  buPROPion (WELLBUTRIN XL) 300 MG 24 hr tablet Take 300 mg by mouth every morning. 09/16/21   [provider]  citalopram (CELEXA) 40 MG tablet Take 40 mg by mouth daily.    [provider]  clonazePAM (KLONOPIN) 1 MG tablet Take 1 mg by mouth at bedtime.     [provider]  doxycycline (VIBRAMYCIN) 100 MG capsule Take 1 capsule (100 mg total) by mouth 2 (two) times daily. 04/12/22   Teodora Medici, FNP  fenofibrate 160 MG tablet Take 160 mg by mouth every evening.    [provider]  ibuprofen (ADVIL,MOTRIN) 800 MG tablet Take 1 tablet (800 mg  total) by mouth 3 (three) times daily. 04/15/16   Long, Wonda Olds, MD  lidocaine (LIDODERM) 5 % Place 1 patch onto the skin daily. Remove & Discard patch within 12 hours or as directed by MD 11/24/20   Sherwood Gambler, MD  Multiple Vitamin (MULTIVITAMIN) tablet Take 1 tablet by mouth daily.    [provider]  ondansetron (ZOFRAN ODT) 4 MG disintegrating tablet Take 1 tablet (4 mg total) by mouth every 8 (eight) hours as needed for nausea or vomiting. 08/12/20   Long, Wonda Olds, MD  predniSONE (DELTASONE) 50 MG tablet Take 1 tablet (50 mg total) by mouth daily. 11/01/21   Dorie Rank, MD    Family History Family History  Problem Relation Age of Onset   Cancer Mother    Cancer Father    Breast cancer Neg Hx     Social History Social History   Tobacco Use   Smoking status: Every Day    Packs/day: 0.25    Years: 46.00    Additional pack years: 0.00    Total pack years: 11.50    Types: Cigarettes   Smokeless tobacco: Never  Vaping Use   Vaping Use: Never used  Substance Use Topics   Alcohol use: Yes    Comment: SOCIAL   Drug use: No     Allergies   Codeine and Other   Review of Systems Review of Systems Per HPI  Physical Exam Triage Vital Signs ED Triage Vitals [04/25/22 1535]  Enc Vitals Group     BP (!) 146/86     Pulse Rate 95     Resp 16     Temp 98 F (36.7 C)     Temp Source Oral     SpO2 95 %     Weight      Height      Head Circumference      Peak Flow      Pain Score 2     Pain Loc      Pain Edu?      Excl. in [place]?    No data found.  Updated Vital Signs BP (!) 146/86 (BP Location: Left Arm)   Pulse 95   Temp 98 F (36.7 C) (Oral)   Resp 16   SpO2 95%   Visual Acuity Right Eye Distance:   Left Eye Distance:   Bilateral Distance:    Right Eye Near:   Left Eye Near:    Bilateral Near:     Physical Exam Constitutional:  General: She is not in acute distress.    Appearance: Normal appearance. She is not toxic-appearing or  diaphoretic.  HENT:     Head: Normocephalic and atraumatic.     Mouth/Throat:     Pharynx: Posterior oropharyngeal erythema present.  Eyes:     Extraocular Movements: Extraocular movements intact.     Conjunctiva/sclera: Conjunctivae normal.  Pulmonary:     Effort: Pulmonary effort is normal.  Lymphadenopathy:     Cervical: Cervical adenopathy present.     Left cervical: Superficial cervical adenopathy present.     Comments: Mild left sided cervical lymph node swelling. Airway is patent.   Neurological:     General: No focal deficit present.     Mental Status: She is alert and oriented to person, place, and time. Mental status is at baseline.  Psychiatric:        Mood and Affect: Mood normal.        Behavior: Behavior normal.        Thought Content: Thought content normal.        Judgment: Judgment normal.      UC Treatments / Results  Labs (all labs ordered are listed, but only abnormal results are displayed) Labs Reviewed  CULTURE, GROUP A STREP [facility])  CBC  COMPREHENSIVE METABOLIC PANEL  POCT RAPID STREP A (OFFICE)  POCT MONO SCREEN ([facility])    EKG   Radiology No results found.  Procedures Procedures (including critical care time)  Medications Ordered in UC Medications - No data to display  Initial Impression / Assessment and Plan / UC Course  I have reviewed the triage vital signs and the nursing notes.  Pertinent labs & imaging results that were available during my care of the patient were reviewed by me and considered in my medical decision making (see chart for details).     Suspect patient's feeling of "golf ball sensation" is due to left-sided lymph node swelling.  Rapid strep and rapid mono were negative.  Throat culture is pending.  Most likely due to recent upper respiratory infection but given it is new onset, and other symptoms have resolved will obtain CMP and CBC.  Patient was advised to follow-up with PCP for further evaluation and management as  well.  Airway is patent and no signs of airway compromise so patient was advised to go to the ER if any symptoms persist or worsen.  Patient verbalized understanding and was agreeable with plan. Final Clinical Impressions(s) / UC Diagnoses   Final diagnoses:  Cervical lymphadenopathy  Sore throat     Discharge Instructions      Rapid strep and rapid mono were negative.  Throat culture is pending.  Will call if it is abnormal.  Lymph node swelling is most likely benign and due to inflammation and infection.  Although, blood work is pending to rule out anything worrisome.  I recommend that you make an appointment with primary care doctor for further evaluation and management.  If symptoms persist or worsen, please go to the emergency department in the meantime.     ED Prescriptions   None    PDMP not reviewed this encounter.   Teodora Medici, [place] 04/25/22 1616

## 2022-04-26 LAB — COMPREHENSIVE METABOLIC PANEL
ALT: 26 IU/L (ref 0–32)
AST: 20 IU/L (ref 0–40)
Albumin/Globulin Ratio: 1.6 (ref 1.2–2.2)
Albumin: 4.2 g/dL (ref 3.9–4.9)
Alkaline Phosphatase: 105 IU/L (ref 44–121)
BUN/Creatinine Ratio: 27 (ref 12–28)
BUN: 11 mg/dL (ref 8–27)
Bilirubin Total: <0.2 mg/dL (ref 0.0–1.2)
CO2: 18 mmol/L — ABNORMAL LOW (ref 20–29)
Calcium: 9.4 mg/dL (ref 8.7–10.3)
Chloride: 105 mmol/L (ref 96–106)
Creatinine, Ser: 0.41 mg/dL — ABNORMAL LOW (ref 0.57–1.00)
Globulin, Total: 2.6 g/dL (ref 1.5–4.5)
Glucose: 147 mg/dL — ABNORMAL HIGH (ref 70–99)
Potassium: 3.9 mmol/L (ref 3.5–5.2)
Sodium: 140 mmol/L (ref 134–144)
Total Protein: 6.8 g/dL (ref 6.0–8.5)
eGFR: 110 mL/min/{1.73_m2} (ref >59)

## 2022-04-26 LAB — CBC
Hematocrit: 39.7 % (ref 34.0–46.6)
Hemoglobin: 13.2 g/dL (ref 11.1–15.9)
MCH: 30.8 pg (ref 26.6–33.0)
MCHC: 33.2 g/dL (ref 31.5–35.7)
MCV: 93 fL (ref 79–97)
Platelets: 248 10*3/uL (ref 150–450)
RBC: 4.28 x10E6/uL (ref 3.77–5.28)
RDW: 13.1 % (ref 11.7–15.4)
WBC: 9.2 10*3/uL (ref 3.4–10.8)

## 2022-04-28 ENCOUNTER — Ambulatory Visit
Admission: RE | Admit: 2022-04-28 | Discharge: 2022-04-28 | Disposition: A | Discharge status: Home / Self Care | Payer: BC Managed Care – PPO | Source: Ambulatory Visit | Attending: Internal Medicine | Admitting: Internal Medicine

## 2022-04-28 ENCOUNTER — Ambulatory Visit: Payer: BC Managed Care – PPO

## 2022-04-28 DIAGNOSIS — Z1231 Encounter for screening mammogram for malignant neoplasm of breast: Secondary | ICD-10-CM | POA: Diagnosis not present

## 2022-04-28 LAB — CULTURE, GROUP A STREP (THRC)

## 2022-05-11 DIAGNOSIS — L400 Psoriasis vulgaris: Secondary | ICD-10-CM | POA: Diagnosis not present

## 2022-05-11 DIAGNOSIS — Z79899 Other long term (current) drug therapy: Secondary | ICD-10-CM | POA: Diagnosis not present

## 2022-05-25 DIAGNOSIS — Z79899 Other long term (current) drug therapy: Secondary | ICD-10-CM | POA: Diagnosis not present

## 2022-05-25 DIAGNOSIS — L400 Psoriasis vulgaris: Secondary | ICD-10-CM | POA: Diagnosis not present

## 2022-06-14 DIAGNOSIS — M7918 Myalgia, other site: Secondary | ICD-10-CM | POA: Diagnosis not present

## 2022-06-14 DIAGNOSIS — M5442 Lumbago with sciatica, left side: Secondary | ICD-10-CM | POA: Diagnosis not present

## 2022-07-06 ENCOUNTER — Telehealth: Payer: BC Managed Care – PPO | Admitting: Nurse Practitioner

## 2022-07-06 ENCOUNTER — Encounter: Payer: Self-pay | Admitting: Nurse Practitioner

## 2022-07-06 DIAGNOSIS — J4521 Mild intermittent asthma with (acute) exacerbation: Secondary | ICD-10-CM

## 2022-07-06 MED ORDER — AZITHROMYCIN 250 MG PO TABS
ORAL_TABLET | ORAL | 0 refills | Status: AC
Start: 2022-07-06 — End: 2022-07-11

## 2022-07-06 MED ORDER — PREDNISONE 20 MG PO TABS
20.0000 mg | ORAL_TABLET | Freq: Two times a day (BID) | ORAL | 0 refills | Status: AC
Start: 2022-07-06 — End: 2022-07-11

## 2022-07-06 NOTE — Progress Notes (Signed)
Virtual Visit Consent   Norma [name], you are scheduled for a virtual visit with a [facility] provider today. Just as with appointments in the office, your consent must be obtained to participate. Your consent will be active for this visit and any virtual visit you may have with one of our providers in the next 365 days. If you have a MyChart account, a copy of this consent can be sent to you electronically.  As this is a virtual visit, video technology does not allow for your provider to perform a traditional examination. This may limit your provider's ability to fully assess your condition. If your provider identifies any concerns that need to be evaluated in person or the need to arrange testing (such as labs, EKG, etc.), we will make arrangements to do so. Although advances in technology are sophisticated, we cannot ensure that it will always work on either your end or our end. If the connection with a video visit is poor, the visit may have to be switched to a telephone visit. With either a video or telephone visit, we are not always able to ensure that we have a secure connection.  By engaging in this virtual visit, you consent to the provision of healthcare and authorize for your insurance to be billed (if applicable) for the services provided during this visit. Depending on your insurance coverage, you may receive a charge related to this service.  I need to obtain your verbal consent now. Are you willing to proceed with your visit today? Norma [name] has provided verbal consent on 07/06/2022 for a virtual visit (video or telephone). Viviano Simas, FNP  Date: 07/06/2022 2:22 PM  Virtual Visit via Video Note   I, Viviano Simas, connected with  Norma [name]  ([id number], 1960/01/29) on 07/06/22 at  2:30 PM EDT by a video-enabled telemedicine application and verified that I am speaking with the correct person using two identifiers.  Location: Patient: Virtual Visit Location Patient:  Home Provider: Virtual Visit Location Provider: Home Office   I discussed the limitations of evaluation and management by telemedicine and the availability of in person appointments. The patient expressed understanding and agreed to proceed.    History of Present Illness: Norma [name] is a 62 y.o. who identifies as a female who was assigned female at birth, and is being seen today for cough and congestion  She has had a low grade fever that is increased in the evenings X 4 nights  She had acute onset of a productive cough with dark mucous  The cough persists throughout the night   She has been using Mucinex Congestion for relief   Feels today that things are getting worse She has a history of bronchitis   She has been using her Albuterol twice daily  Most recent prednisone use was 10/2021  Problems:  Patient Active Problem List   Diagnosis Date Noted   Symptomatic mammary hypertrophy 11/06/2020   Back pain 11/06/2020   Neck pain 11/06/2020   Symptomatic cholelithiasis 09/01/2020   Snoring 08/06/2019   Witnessed episode of apnea 08/06/2019   Excessive sleepiness 08/06/2019   Fatigue 08/06/2019   Insomnia 08/06/2019   Radiculopathy 04/10/2013    Allergies:  Allergies  Allergen Reactions   Codeine Itching    In high doses   Other Itching and Rash    Chlorhexidine wipes - rash all over body   Medications:  Current Outpatient Medications:    albuterol (PROVENTIL) (2.5 MG/3ML) 0.083% nebulizer solution,  Take 3 mLs (2.5 mg total) by nebulization every 6 (six) hours as needed for wheezing or shortness of breath., Disp: 75 mL, Rfl: 12   albuterol (VENTOLIN HFA) 108 (90 Base) MCG/ACT inhaler, Inhale 2 puffs into the lungs every 6 (six) hours as needed for wheezing or shortness of breath., Disp: , Rfl:    Ascorbic Acid (VITAMIN C PO), Take 1 tablet by mouth daily., Disp: , Rfl:    buPROPion (WELLBUTRIN SR) 150 MG 12 hr tablet, Take 150 mg by mouth daily., Disp: , Rfl:     buPROPion (WELLBUTRIN XL) 300 MG 24 hr tablet, Take 300 mg by mouth every morning., Disp: , Rfl:    citalopram (CELEXA) 40 MG tablet, Take 40 mg by mouth daily., Disp: , Rfl:    clonazePAM (KLONOPIN) 1 MG tablet, Take 1 mg by mouth at bedtime. , Disp: , Rfl:    doxycycline (VIBRAMYCIN) 100 MG capsule, Take 1 capsule (100 mg total) by mouth 2 (two) times daily., Disp: 20 capsule, Rfl: 0   fenofibrate 160 MG tablet, Take 160 mg by mouth every evening., Disp: , Rfl:    ibuprofen (ADVIL,MOTRIN) 800 MG tablet, Take 1 tablet (800 mg total) by mouth 3 (three) times daily., Disp: 21 tablet, Rfl: 0   lidocaine (LIDODERM) 5 %, Place 1 patch onto the skin daily. Remove & Discard patch within 12 hours or as directed by MD, Disp: 6 patch, Rfl: 0   Multiple Vitamin (MULTIVITAMIN) tablet, Take 1 tablet by mouth daily., Disp: , Rfl:    ondansetron (ZOFRAN ODT) 4 MG disintegrating tablet, Take 1 tablet (4 mg total) by mouth every 8 (eight) hours as needed for nausea or vomiting., Disp: 20 tablet, Rfl: 0   predniSONE (DELTASONE) 50 MG tablet, Take 1 tablet (50 mg total) by mouth daily., Disp: 5 tablet, Rfl: 0  Observations/Objective: Patient is well-developed, well-nourished in no acute distress.  Resting comfortably  at home.  Head is normocephalic, atraumatic.  No labored breathing.  Speech is clear and coherent with logical content.  Patient is alert and oriented at baseline.    Assessment and Plan: 1. Mild intermittent asthmatic bronchitis with acute exacerbation Continue Mucinex for cough relief  Albuterol every 4-6 hour as needed  - azithromycin (ZITHROMAX) 250 MG tablet; Take 2 tablets on day 1, then 1 tablet daily on days 2 through 5  Dispense: 6 tablet; Refill: 0 - predniSONE (DELTASONE) 20 MG tablet; Take 1 tablet (20 mg total) by mouth 2 (two) times daily with a meal for 5 days.  Dispense: 10 tablet; Refill: 0     Follow Up Instructions: I discussed the assessment and treatment plan with the  patient. The patient was provided an opportunity to ask questions and all were answered. The patient agreed with the plan and demonstrated an understanding of the instructions.  A copy of instructions were sent to the patient via MyChart unless otherwise noted below.    The patient was advised to call back or seek an in-person evaluation if the symptoms worsen or if the condition fails to improve as anticipated.  Time:  I spent 15 minutes with the patient via telehealth technology discussing the above problems/concerns.    Viviano Simas, FNP

## 2022-07-19 DIAGNOSIS — Z713 Dietary counseling and surveillance: Secondary | ICD-10-CM | POA: Diagnosis not present

## 2022-07-29 DIAGNOSIS — M4692 Unspecified inflammatory spondylopathy, cervical region: Secondary | ICD-10-CM | POA: Diagnosis not present

## 2022-08-05 DIAGNOSIS — M47812 Spondylosis without myelopathy or radiculopathy, cervical region: Secondary | ICD-10-CM | POA: Diagnosis not present

## 2022-08-16 DIAGNOSIS — M1812 Unilateral primary osteoarthritis of first carpometacarpal joint, left hand: Secondary | ICD-10-CM | POA: Diagnosis not present

## 2022-08-16 DIAGNOSIS — M7062 Trochanteric bursitis, left hip: Secondary | ICD-10-CM | POA: Diagnosis not present

## 2022-09-02 DIAGNOSIS — M545 Low back pain, unspecified: Secondary | ICD-10-CM | POA: Diagnosis not present

## 2022-09-12 DIAGNOSIS — Z131 Encounter for screening for diabetes mellitus: Secondary | ICD-10-CM | POA: Diagnosis not present

## 2022-09-12 DIAGNOSIS — Z136 Encounter for screening for cardiovascular disorders: Secondary | ICD-10-CM | POA: Diagnosis not present

## 2022-09-12 DIAGNOSIS — R0683 Snoring: Secondary | ICD-10-CM | POA: Diagnosis not present

## 2022-09-12 DIAGNOSIS — Z0001 Encounter for general adult medical examination with abnormal findings: Secondary | ICD-10-CM | POA: Diagnosis not present

## 2022-09-12 DIAGNOSIS — Z1329 Encounter for screening for other suspected endocrine disorder: Secondary | ICD-10-CM | POA: Diagnosis not present

## 2022-09-12 DIAGNOSIS — F439 Reaction to severe stress, unspecified: Secondary | ICD-10-CM | POA: Diagnosis not present

## 2022-09-12 DIAGNOSIS — R030 Elevated blood-pressure reading, without diagnosis of hypertension: Secondary | ICD-10-CM | POA: Diagnosis not present

## 2022-09-12 DIAGNOSIS — R635 Abnormal weight gain: Secondary | ICD-10-CM | POA: Diagnosis not present

## 2022-09-19 DIAGNOSIS — Z79899 Other long term (current) drug therapy: Secondary | ICD-10-CM | POA: Diagnosis not present

## 2022-09-19 DIAGNOSIS — L400 Psoriasis vulgaris: Secondary | ICD-10-CM | POA: Diagnosis not present

## 2022-09-29 DIAGNOSIS — M79671 Pain in right foot: Secondary | ICD-10-CM | POA: Diagnosis not present

## 2022-09-29 DIAGNOSIS — M7062 Trochanteric bursitis, left hip: Secondary | ICD-10-CM | POA: Diagnosis not present

## 2022-09-30 DIAGNOSIS — K136 Irritative hyperplasia of oral mucosa: Secondary | ICD-10-CM | POA: Diagnosis not present

## 2022-11-15 DIAGNOSIS — M79671 Pain in right foot: Secondary | ICD-10-CM | POA: Diagnosis not present

## 2022-11-15 DIAGNOSIS — M7062 Trochanteric bursitis, left hip: Secondary | ICD-10-CM | POA: Diagnosis not present

## 2022-12-15 DIAGNOSIS — M47812 Spondylosis without myelopathy or radiculopathy, cervical region: Secondary | ICD-10-CM | POA: Diagnosis not present

## 2023-01-11 DIAGNOSIS — R062 Wheezing: Secondary | ICD-10-CM | POA: Diagnosis not present

## 2023-01-11 DIAGNOSIS — R030 Elevated blood-pressure reading, without diagnosis of hypertension: Secondary | ICD-10-CM | POA: Diagnosis not present

## 2023-01-11 DIAGNOSIS — Z136 Encounter for screening for cardiovascular disorders: Secondary | ICD-10-CM | POA: Diagnosis not present

## 2023-01-11 DIAGNOSIS — R051 Acute cough: Secondary | ICD-10-CM | POA: Diagnosis not present

## 2023-01-11 DIAGNOSIS — Z131 Encounter for screening for diabetes mellitus: Secondary | ICD-10-CM | POA: Diagnosis not present

## 2023-01-11 DIAGNOSIS — J40 Bronchitis, not specified as acute or chronic: Secondary | ICD-10-CM | POA: Diagnosis not present

## 2023-01-11 DIAGNOSIS — Z0001 Encounter for general adult medical examination with abnormal findings: Secondary | ICD-10-CM | POA: Diagnosis not present

## 2023-01-11 DIAGNOSIS — F5104 Psychophysiologic insomnia: Secondary | ICD-10-CM | POA: Diagnosis not present

## 2023-01-17 DIAGNOSIS — M5412 Radiculopathy, cervical region: Secondary | ICD-10-CM | POA: Diagnosis not present

## 2023-01-20 DIAGNOSIS — R7303 Prediabetes: Secondary | ICD-10-CM | POA: Diagnosis not present

## 2023-01-20 DIAGNOSIS — F419 Anxiety disorder, unspecified: Secondary | ICD-10-CM | POA: Diagnosis not present

## 2023-01-20 DIAGNOSIS — E782 Mixed hyperlipidemia: Secondary | ICD-10-CM | POA: Diagnosis not present

## 2023-01-20 DIAGNOSIS — I10 Essential (primary) hypertension: Secondary | ICD-10-CM | POA: Diagnosis not present

## 2023-01-23 ENCOUNTER — Other Ambulatory Visit: Payer: Self-pay | Admitting: Orthopedic Surgery

## 2023-01-23 DIAGNOSIS — M5412 Radiculopathy, cervical region: Secondary | ICD-10-CM

## 2023-01-27 ENCOUNTER — Ambulatory Visit
Admission: RE | Admit: 2023-01-27 | Discharge: 2023-01-27 | Disposition: A | Discharge status: Home / Self Care | Payer: BC Managed Care – PPO | Source: Ambulatory Visit | Attending: Orthopedic Surgery | Admitting: Orthopedic Surgery

## 2023-01-27 DIAGNOSIS — M5412 Radiculopathy, cervical region: Secondary | ICD-10-CM

## 2023-01-27 DIAGNOSIS — Z981 Arthrodesis status: Secondary | ICD-10-CM | POA: Diagnosis not present

## 2023-01-27 DIAGNOSIS — M542 Cervicalgia: Secondary | ICD-10-CM | POA: Diagnosis not present

## 2023-01-30 DIAGNOSIS — M542 Cervicalgia: Secondary | ICD-10-CM | POA: Diagnosis not present

## 2023-02-01 DIAGNOSIS — Z79899 Other long term (current) drug therapy: Secondary | ICD-10-CM | POA: Diagnosis not present

## 2023-02-01 DIAGNOSIS — L400 Psoriasis vulgaris: Secondary | ICD-10-CM | POA: Diagnosis not present

## 2023-02-07 DIAGNOSIS — M5412 Radiculopathy, cervical region: Secondary | ICD-10-CM | POA: Diagnosis not present

## 2023-02-08 DIAGNOSIS — F419 Anxiety disorder, unspecified: Secondary | ICD-10-CM | POA: Diagnosis not present

## 2023-02-08 DIAGNOSIS — I10 Essential (primary) hypertension: Secondary | ICD-10-CM | POA: Diagnosis not present

## 2023-02-08 DIAGNOSIS — R7303 Prediabetes: Secondary | ICD-10-CM | POA: Diagnosis not present

## 2023-02-08 DIAGNOSIS — E782 Mixed hyperlipidemia: Secondary | ICD-10-CM | POA: Diagnosis not present

## 2023-02-17 DIAGNOSIS — J411 Mucopurulent chronic bronchitis: Secondary | ICD-10-CM | POA: Diagnosis not present

## 2023-02-17 DIAGNOSIS — R051 Acute cough: Secondary | ICD-10-CM | POA: Diagnosis not present

## 2023-02-20 ENCOUNTER — Other Ambulatory Visit: Payer: Self-pay | Admitting: Orthopedic Surgery

## 2023-03-16 ENCOUNTER — Ambulatory Visit (HOSPITAL_COMMUNITY): Admit: 2023-03-16 | Payer: BC Managed Care – PPO | Admitting: Orthopedic Surgery

## 2023-03-16 SURGERY — ANTERIOR CERVICAL DECOMPRESSION/DISCECTOMY FUSION 1 LEVEL/HARDWARE REMOVAL
Anesthesia: General

## 2023-03-21 DIAGNOSIS — E782 Mixed hyperlipidemia: Secondary | ICD-10-CM | POA: Diagnosis not present

## 2023-03-21 DIAGNOSIS — F419 Anxiety disorder, unspecified: Secondary | ICD-10-CM | POA: Diagnosis not present

## 2023-03-21 DIAGNOSIS — I10 Essential (primary) hypertension: Secondary | ICD-10-CM | POA: Diagnosis not present

## 2023-03-21 DIAGNOSIS — R7303 Prediabetes: Secondary | ICD-10-CM | POA: Diagnosis not present

## 2023-04-20 DIAGNOSIS — R202 Paresthesia of skin: Secondary | ICD-10-CM | POA: Diagnosis not present

## 2023-04-20 DIAGNOSIS — F419 Anxiety disorder, unspecified: Secondary | ICD-10-CM | POA: Diagnosis not present

## 2023-04-20 DIAGNOSIS — R7303 Prediabetes: Secondary | ICD-10-CM | POA: Diagnosis not present

## 2023-04-20 DIAGNOSIS — I10 Essential (primary) hypertension: Secondary | ICD-10-CM | POA: Diagnosis not present

## 2023-04-20 DIAGNOSIS — E782 Mixed hyperlipidemia: Secondary | ICD-10-CM | POA: Diagnosis not present

## 2023-04-24 DIAGNOSIS — R7303 Prediabetes: Secondary | ICD-10-CM | POA: Diagnosis not present

## 2023-04-24 DIAGNOSIS — R202 Paresthesia of skin: Secondary | ICD-10-CM | POA: Diagnosis not present

## 2023-04-24 DIAGNOSIS — I10 Essential (primary) hypertension: Secondary | ICD-10-CM | POA: Diagnosis not present

## 2023-04-24 DIAGNOSIS — E875 Hyperkalemia: Secondary | ICD-10-CM | POA: Diagnosis not present

## 2023-05-31 ENCOUNTER — Other Ambulatory Visit: Payer: Self-pay | Admitting: *Deleted

## 2023-05-31 DIAGNOSIS — Z1231 Encounter for screening mammogram for malignant neoplasm of breast: Secondary | ICD-10-CM

## 2023-06-02 ENCOUNTER — Ambulatory Visit
Admission: RE | Admit: 2023-06-02 | Discharge: 2023-06-02 | Disposition: A | Discharge status: Home / Self Care | Source: Ambulatory Visit | Attending: Internal Medicine | Admitting: Internal Medicine

## 2023-06-02 ENCOUNTER — Other Ambulatory Visit: Payer: Self-pay | Admitting: Physician Assistant

## 2023-06-02 DIAGNOSIS — Z1231 Encounter for screening mammogram for malignant neoplasm of breast: Secondary | ICD-10-CM

## 2023-07-27 DIAGNOSIS — M1812 Unilateral primary osteoarthritis of first carpometacarpal joint, left hand: Secondary | ICD-10-CM | POA: Diagnosis not present

## 2023-07-31 DIAGNOSIS — R7303 Prediabetes: Secondary | ICD-10-CM | POA: Diagnosis not present

## 2023-07-31 DIAGNOSIS — R202 Paresthesia of skin: Secondary | ICD-10-CM | POA: Diagnosis not present

## 2023-07-31 DIAGNOSIS — I10 Essential (primary) hypertension: Secondary | ICD-10-CM | POA: Diagnosis not present

## 2023-07-31 DIAGNOSIS — E782 Mixed hyperlipidemia: Secondary | ICD-10-CM | POA: Diagnosis not present

## 2023-08-14 DIAGNOSIS — I10 Essential (primary) hypertension: Secondary | ICD-10-CM | POA: Diagnosis not present

## 2023-08-14 DIAGNOSIS — E782 Mixed hyperlipidemia: Secondary | ICD-10-CM | POA: Diagnosis not present

## 2023-08-14 DIAGNOSIS — E119 Type 2 diabetes mellitus without complications: Secondary | ICD-10-CM | POA: Diagnosis not present

## 2023-08-14 DIAGNOSIS — R202 Paresthesia of skin: Secondary | ICD-10-CM | POA: Diagnosis not present

## 2023-09-11 DIAGNOSIS — E119 Type 2 diabetes mellitus without complications: Secondary | ICD-10-CM | POA: Diagnosis not present

## 2023-09-11 DIAGNOSIS — E782 Mixed hyperlipidemia: Secondary | ICD-10-CM | POA: Diagnosis not present

## 2023-09-11 DIAGNOSIS — I10 Essential (primary) hypertension: Secondary | ICD-10-CM | POA: Diagnosis not present

## 2023-09-11 DIAGNOSIS — R202 Paresthesia of skin: Secondary | ICD-10-CM | POA: Diagnosis not present

## 2023-09-30 ENCOUNTER — Telehealth: Admitting: Nurse Practitioner

## 2023-09-30 DIAGNOSIS — J40 Bronchitis, not specified as acute or chronic: Secondary | ICD-10-CM

## 2023-09-30 MED ORDER — PROMETHAZINE-DM 6.25-15 MG/5ML PO SYRP
5.0000 mL | ORAL_SOLUTION | Freq: Every evening | ORAL | 0 refills | Status: AC
Start: 2023-09-30 — End: ?

## 2023-09-30 MED ORDER — ALBUTEROL SULFATE HFA 108 (90 BASE) MCG/ACT IN AERS
2.0000 | INHALATION_SPRAY | Freq: Four times a day (QID) | RESPIRATORY_TRACT | 0 refills | Status: AC | PRN
Start: 2023-09-30 — End: ?

## 2023-09-30 MED ORDER — AZITHROMYCIN 250 MG PO TABS
ORAL_TABLET | ORAL | 0 refills | Status: AC
Start: 2023-09-30 — End: 2023-10-05

## 2023-09-30 NOTE — Patient Instructions (Signed)
 Norma [name], thank you for joining Haze LELON Servant, NP for today's virtual visit.  While this provider is not your primary care provider (PCP), if your PCP is located in our provider database this encounter information will be shared with them immediately following your visit.   A [facility] MyChart account gives you access to today's visit and all your visits, tests, and labs performed at [facility]  click here if you don't have a [facility] MyChart account or go to [email or web address]  Consent: (Patient) Norma [name] provided verbal consent for this virtual visit at the beginning of the encounter.  Current Medications:  Current Outpatient Medications:    azithromycin  (ZITHROMAX ) 250 MG tablet, Take 2 tablets on day 1, then 1 tablet daily on days 2 through 5, Disp: 6 tablet, Rfl: 0   promethazine -dextromethorphan (PROMETHAZINE -DM) 6.25-15 MG/5ML syrup, Take 5 mLs by mouth at bedtime., Disp: 240 mL, Rfl: 0   albuterol  (PROVENTIL ) (2.5 MG/3ML) 0.083% nebulizer solution, Take 3 mLs (2.5 mg total) by nebulization every 6 (six) hours as needed for wheezing or shortness of breath., Disp: 75 mL, Rfl: 12   albuterol  (VENTOLIN  HFA) 108 (90 Base) MCG/ACT inhaler, Inhale 2 puffs into the lungs every 6 (six) hours as needed for wheezing or shortness of breath., Disp: 8 g, Rfl: 0   Ascorbic Acid (VITAMIN C PO), Take 1 tablet by mouth daily., Disp: , Rfl:    buPROPion  (WELLBUTRIN  SR) 150 MG 12 hr tablet, Take 150 mg by mouth daily., Disp: , Rfl:    buPROPion  (WELLBUTRIN  XL) 300 MG 24 hr tablet, Take 300 mg by mouth every morning., Disp: , Rfl:    citalopram  (CELEXA ) 40 MG tablet, Take 40 mg by mouth daily., Disp: , Rfl:    clonazePAM  (KLONOPIN ) 1 MG tablet, Take 1 mg by mouth at bedtime. , Disp: , Rfl:    doxycycline  (VIBRAMYCIN ) 100 MG capsule, Take 1 capsule (100 mg total) by mouth 2 (two) times daily., Disp: 20 capsule, Rfl: 0   fenofibrate 160 MG tablet, Take 160 mg by mouth every  evening., Disp: , Rfl:    ibuprofen  (ADVIL ,MOTRIN ) 800 MG tablet, Take 1 tablet (800 mg total) by mouth 3 (three) times daily., Disp: 21 tablet, Rfl: 0   lidocaine  (LIDODERM ) 5 %, Place 1 patch onto the skin daily. Remove & Discard patch within 12 hours or as directed by MD, Disp: 6 patch, Rfl: 0   Multiple Vitamin (MULTIVITAMIN) tablet, Take 1 tablet by mouth daily., Disp: , Rfl:    ondansetron  (ZOFRAN  ODT) 4 MG disintegrating tablet, Take 1 tablet (4 mg total) by mouth every 8 (eight) hours as needed for nausea or vomiting., Disp: 20 tablet, Rfl: 0   Medications ordered in this encounter:  Meds ordered this encounter  Medications   albuterol  (VENTOLIN  HFA) 108 (90 Base) MCG/ACT inhaler    Sig: Inhale 2 puffs into the lungs every 6 (six) hours as needed for wheezing or shortness of breath.    Dispense:  8 g    Refill:  0    Supervising Provider:   BLAISE ALEENE KIDD [[id number]]   azithromycin  (ZITHROMAX ) 250 MG tablet    Sig: Take 2 tablets on day 1, then 1 tablet daily on days 2 through 5    Dispense:  6 tablet    Refill:  0    Supervising Provider:   LAMPTEY, PHILIP O [[id number]]   promethazine -dextromethorphan (PROMETHAZINE -DM) 6.25-15 MG/5ML syrup    Sig: Take 5 mLs by  mouth at bedtime.    Dispense:  240 mL    Refill:  0    Supervising Provider:   BLAISE ALEENE KIDD [[id number]]     *If you need refills on other medications prior to your next appointment, please contact your pharmacy*  Follow-Up: Call back or seek an in-person evaluation if the symptoms worsen or if the condition fails to improve as anticipated.  [facility] [phone]  Other Instructions INSTRUCTIONS: use a humidifier for nasal congestion Drink plenty of fluids, rest and wash hands frequently to avoid the spread of infection Alternate tylenol  and Motrin  for relief of fever    If you have been instructed to have an in-person evaluation today at a local Urgent Care facility, please use the link  below. It will take you to a list of all of our available [facility], including address, phone number and hours of operation. Please do not delay care.  [facility]  If you or a family member do not have a primary care provider, use the link below to schedule a visit and establish care. When you choose a [facility] primary care physician or advanced practice provider, you gain a long-term partner in health. Find a Primary Care Provider  Learn more about [facility]'s in-office and virtual care options: [facility] - Get Care Now

## 2023-09-30 NOTE — Progress Notes (Signed)
 Virtual Visit Consent   Norma [name], you are scheduled for a virtual visit with a [facility] provider today. Just as with appointments in the office, your consent must be obtained to participate. Your consent will be active for this visit and any virtual visit you may have with one of our providers in the next 365 days. If you have a MyChart account, a copy of this consent can be sent to you electronically.  As this is a virtual visit, video technology does not allow for your provider to perform a traditional examination. This may limit your provider's ability to fully assess your condition. If your provider identifies any concerns that need to be evaluated in person or the need to arrange testing (such as labs, EKG, etc.), we will make arrangements to do so. Although advances in technology are sophisticated, we cannot ensure that it will always work on either your end or our end. If the connection with a video visit is poor, the visit may have to be switched to a telephone visit. With either a video or telephone visit, we are not always able to ensure that we have a secure connection.  By engaging in this virtual visit, you consent to the provision of healthcare and authorize for your insurance to be billed (if applicable) for the services provided during this visit. Depending on your insurance coverage, you may receive a charge related to this service.  I need to obtain your verbal consent now. Are you willing to proceed with your visit today? Norma [name] has provided verbal consent on 09/30/2023 for a virtual visit (video or telephone). Norma LELON Servant, NP  Date: 09/30/2023 11:25 AM   Virtual Visit via Video Note   I, Norma [name], connected with  Norma [name]  ([id number], 14-Feb-1960) on 09/30/23 at 11:15 AM EDT by a video-enabled telemedicine application and verified that I am speaking with the correct person using two identifiers.  Location: Patient: Virtual Visit Location Patient:  Mobile Provider: Virtual Visit Location Provider: Home Office   I discussed the limitations of evaluation and management by telemedicine and the availability of in person appointments. The patient expressed understanding and agreed to proceed.    History of Present Illness: Norma [name] is a 63 y.o. who identifies as a female who was assigned female at birth, and is being seen today for BRONCHITIS.   Ms Paez has been experiencing symptoms of bronchitis over the past several days including: productive cough, headache, sore throat, malaise, fever. She has a history of smoking. Taking motrin  and delysm with no relief of symptoms.  COVID test negative.    Problems:  Patient Active Problem List   Diagnosis Date Noted   Symptomatic mammary hypertrophy 11/06/2020   Back pain 11/06/2020   Neck pain 11/06/2020   Symptomatic cholelithiasis 09/01/2020   Snoring 08/06/2019   Witnessed episode of apnea 08/06/2019   Excessive sleepiness 08/06/2019   Fatigue 08/06/2019   Insomnia 08/06/2019   Radiculopathy 04/10/2013    Allergies:  Allergies  Allergen Reactions   Codeine Itching    In high doses   Other Itching and Rash    Chlorhexidine  wipes - rash all over body   Medications:  Current Outpatient Medications:    azithromycin  (ZITHROMAX ) 250 MG tablet, Take 2 tablets on day 1, then 1 tablet daily on days 2 through 5, Disp: 6 tablet, Rfl: 0   promethazine -dextromethorphan (PROMETHAZINE -DM) 6.25-15 MG/5ML syrup, Take 5 mLs by mouth at bedtime., Disp: 240  mL, Rfl: 0   albuterol  (PROVENTIL ) (2.5 MG/3ML) 0.083% nebulizer solution, Take 3 mLs (2.5 mg total) by nebulization every 6 (six) hours as needed for wheezing or shortness of breath., Disp: 75 mL, Rfl: 12   albuterol  (VENTOLIN  HFA) 108 (90 Base) MCG/ACT inhaler, Inhale 2 puffs into the lungs every 6 (six) hours as needed for wheezing or shortness of breath., Disp: 8 g, Rfl: 0   Ascorbic Acid (VITAMIN C PO), Take 1 tablet by mouth daily.,  Disp: , Rfl:    buPROPion  (WELLBUTRIN  SR) 150 MG 12 hr tablet, Take 150 mg by mouth daily., Disp: , Rfl:    buPROPion  (WELLBUTRIN  XL) 300 MG 24 hr tablet, Take 300 mg by mouth every morning., Disp: , Rfl:    citalopram  (CELEXA ) 40 MG tablet, Take 40 mg by mouth daily., Disp: , Rfl:    clonazePAM  (KLONOPIN ) 1 MG tablet, Take 1 mg by mouth at bedtime. , Disp: , Rfl:    doxycycline  (VIBRAMYCIN ) 100 MG capsule, Take 1 capsule (100 mg total) by mouth 2 (two) times daily., Disp: 20 capsule, Rfl: 0   fenofibrate 160 MG tablet, Take 160 mg by mouth every evening., Disp: , Rfl:    ibuprofen  (ADVIL ,MOTRIN ) 800 MG tablet, Take 1 tablet (800 mg total) by mouth 3 (three) times daily., Disp: 21 tablet, Rfl: 0   lidocaine  (LIDODERM ) 5 %, Place 1 patch onto the skin daily. Remove & Discard patch within 12 hours or as directed by MD, Disp: 6 patch, Rfl: 0   Multiple Vitamin (MULTIVITAMIN) tablet, Take 1 tablet by mouth daily., Disp: , Rfl:    ondansetron  (ZOFRAN  ODT) 4 MG disintegrating tablet, Take 1 tablet (4 mg total) by mouth every 8 (eight) hours as needed for nausea or vomiting., Disp: 20 tablet, Rfl: 0  Observations/Objective: Patient is well-developed, well-nourished in no acute distress.  Resting comfortably at home.  Head is normocephalic, atraumatic.  No labored breathing.  Speech is clear and coherent with logical content.  Patient is alert and oriented at baseline.    Assessment and Plan: 1. Bronchitis (Primary) - albuterol  (VENTOLIN  HFA) 108 (90 Base) MCG/ACT inhaler; Inhale 2 puffs into the lungs every 6 (six) hours as needed for wheezing or shortness of breath.  Dispense: 8 g; Refill: 0 - azithromycin  (ZITHROMAX ) 250 MG tablet; Take 2 tablets on day 1, then 1 tablet daily on days 2 through 5  Dispense: 6 tablet; Refill: 0 - promethazine -dextromethorphan (PROMETHAZINE -DM) 6.25-15 MG/5ML syrup; Take 5 mLs by mouth at bedtime.  Dispense: 240 mL; Refill: 0  INSTRUCTIONS: use a humidifier for  nasal congestion Drink plenty of fluids, rest and wash hands frequently to avoid the spread of infection Alternate tylenol  and Motrin  for relief of fever   Follow Up Instructions: I discussed the assessment and treatment plan with the patient. The patient was provided an opportunity to ask questions and all were answered. The patient agreed with the plan and demonstrated an understanding of the instructions.  A copy of instructions were sent to the patient via MyChart unless otherwise noted below.    The patient was advised to call back or seek an in-person evaluation if the symptoms worsen or if the condition fails to improve as anticipated.    Iren Whipp W Aynsley Fleet, NP

## 2023-10-02 DIAGNOSIS — R202 Paresthesia of skin: Secondary | ICD-10-CM | POA: Diagnosis not present

## 2023-10-02 DIAGNOSIS — E119 Type 2 diabetes mellitus without complications: Secondary | ICD-10-CM | POA: Diagnosis not present

## 2023-10-02 DIAGNOSIS — I10 Essential (primary) hypertension: Secondary | ICD-10-CM | POA: Diagnosis not present

## 2023-10-02 DIAGNOSIS — E782 Mixed hyperlipidemia: Secondary | ICD-10-CM | POA: Diagnosis not present

## 2023-10-31 DIAGNOSIS — E119 Type 2 diabetes mellitus without complications: Secondary | ICD-10-CM | POA: Diagnosis not present

## 2023-10-31 DIAGNOSIS — E782 Mixed hyperlipidemia: Secondary | ICD-10-CM | POA: Diagnosis not present

## 2023-10-31 DIAGNOSIS — I10 Essential (primary) hypertension: Secondary | ICD-10-CM | POA: Diagnosis not present

## 2023-10-31 DIAGNOSIS — R202 Paresthesia of skin: Secondary | ICD-10-CM | POA: Diagnosis not present

## 2023-11-28 DIAGNOSIS — R202 Paresthesia of skin: Secondary | ICD-10-CM | POA: Diagnosis not present

## 2023-11-28 DIAGNOSIS — E119 Type 2 diabetes mellitus without complications: Secondary | ICD-10-CM | POA: Diagnosis not present

## 2023-11-28 DIAGNOSIS — E782 Mixed hyperlipidemia: Secondary | ICD-10-CM | POA: Diagnosis not present

## 2023-11-28 DIAGNOSIS — I10 Essential (primary) hypertension: Secondary | ICD-10-CM | POA: Diagnosis not present

## 2023-12-25 DIAGNOSIS — N3941 Urge incontinence: Secondary | ICD-10-CM | POA: Diagnosis not present

## 2023-12-25 DIAGNOSIS — N3001 Acute cystitis with hematuria: Secondary | ICD-10-CM | POA: Diagnosis not present

## 2023-12-25 DIAGNOSIS — I10 Essential (primary) hypertension: Secondary | ICD-10-CM | POA: Diagnosis not present

## 2023-12-25 DIAGNOSIS — E119 Type 2 diabetes mellitus without complications: Secondary | ICD-10-CM | POA: Diagnosis not present

## 2024-01-02 ENCOUNTER — Other Ambulatory Visit: Payer: Self-pay | Admitting: Orthopedic Surgery

## 2024-01-17 ENCOUNTER — Ambulatory Visit (HOSPITAL_COMMUNITY): Admit: 2024-01-17 | Admitting: Orthopedic Surgery

## 2024-01-17 SURGERY — ARTHROPLASTY, FINGER
Anesthesia: General | Site: Finger | Laterality: Left

## 2024-02-21 ENCOUNTER — Emergency Department (HOSPITAL_COMMUNITY): Payer: Self-pay

## 2024-02-21 ENCOUNTER — Inpatient Hospital Stay: Admit: 2024-02-21 | Admitting: Orthopedic Surgery

## 2024-02-21 ENCOUNTER — Ambulatory Visit (HOSPITAL_COMMUNITY)
Admission: EM | Admit: 2024-02-21 | Discharge: 2024-02-21 | Disposition: A | Discharge status: Home / Self Care | Payer: Self-pay | Attending: Emergency Medicine | Admitting: Emergency Medicine

## 2024-02-21 ENCOUNTER — Other Ambulatory Visit: Payer: Self-pay

## 2024-02-21 ENCOUNTER — Encounter (HOSPITAL_COMMUNITY): Payer: Self-pay

## 2024-02-21 ENCOUNTER — Encounter (HOSPITAL_COMMUNITY)
Admission: EM | Disposition: A | Discharge status: Home / Self Care | Payer: Self-pay | Source: Home / Self Care | Attending: Emergency Medicine

## 2024-02-21 DIAGNOSIS — F419 Anxiety disorder, unspecified: Secondary | ICD-10-CM | POA: Insufficient documentation

## 2024-02-21 DIAGNOSIS — F32A Depression, unspecified: Secondary | ICD-10-CM | POA: Insufficient documentation

## 2024-02-21 DIAGNOSIS — Y9301 Activity, walking, marching and hiking: Secondary | ICD-10-CM | POA: Insufficient documentation

## 2024-02-21 DIAGNOSIS — F418 Other specified anxiety disorders: Secondary | ICD-10-CM

## 2024-02-21 DIAGNOSIS — S82841A Displaced bimalleolar fracture of right lower leg, initial encounter for closed fracture: Secondary | ICD-10-CM | POA: Insufficient documentation

## 2024-02-21 DIAGNOSIS — S82891A Other fracture of right lower leg, initial encounter for closed fracture: Secondary | ICD-10-CM

## 2024-02-21 DIAGNOSIS — F17210 Nicotine dependence, cigarettes, uncomplicated: Secondary | ICD-10-CM | POA: Insufficient documentation

## 2024-02-21 DIAGNOSIS — W000XXA Fall on same level due to ice and snow, initial encounter: Secondary | ICD-10-CM | POA: Insufficient documentation

## 2024-02-21 LAB — CBC WITH DIFFERENTIAL/PLATELET
Abs Immature Granulocytes: 0.07 10*3/uL (ref 0.00–0.07)
Basophils Absolute: 0.1 10*3/uL (ref 0.0–0.1)
Basophils Relative: 1 %
Eosinophils Absolute: 0.1 10*3/uL (ref 0.0–0.5)
Eosinophils Relative: 1 %
HCT: 39.6 % (ref 36.0–46.0)
Hemoglobin: 13.3 g/dL (ref 12.0–15.0)
Immature Granulocytes: 1 %
Lymphocytes Relative: 19 %
Lymphs Abs: 2.2 10*3/uL (ref 0.7–4.0)
MCH: 31 pg (ref 26.0–34.0)
MCHC: 33.6 g/dL (ref 30.0–36.0)
MCV: 92.3 fL (ref 80.0–100.0)
Monocytes Absolute: 0.9 10*3/uL (ref 0.1–1.0)
Monocytes Relative: 7 %
Neutro Abs: 8.4 10*3/uL — ABNORMAL HIGH (ref 1.7–7.7)
Neutrophils Relative %: 71 %
Platelets: 220 10*3/uL (ref 150–400)
RBC: 4.29 MIL/uL (ref 3.87–5.11)
RDW: 13.7 % (ref 11.5–15.5)
WBC: 11.7 10*3/uL — ABNORMAL HIGH (ref 4.0–10.5)
nRBC: 0 % (ref 0.0–0.2)

## 2024-02-21 LAB — BASIC METABOLIC PANEL WITH GFR
Anion gap: 12 (ref 5–15)
BUN: 12 mg/dL (ref 8–23)
CO2: 23 mmol/L (ref 22–32)
Calcium: 8.6 mg/dL — ABNORMAL LOW (ref 8.9–10.3)
Chloride: 102 mmol/L (ref 98–111)
Creatinine, Ser: 0.58 mg/dL (ref 0.44–1.00)
GFR, Estimated: >60 mL/min
Glucose, Bld: 124 mg/dL — ABNORMAL HIGH (ref 70–99)
Potassium: 4.2 mmol/L (ref 3.5–5.1)
Sodium: 137 mmol/L (ref 135–145)

## 2024-02-21 LAB — SURGICAL PCR SCREEN
MRSA, PCR: POSITIVE — AB
Staphylococcus aureus: POSITIVE — AB

## 2024-02-21 MED ORDER — FENTANYL CITRATE (PF) 100 MCG/2ML IJ SOLN: 100.0000 ug | Freq: Once | INTRAMUSCULAR | Status: AC

## 2024-02-21 MED ORDER — DOCUSATE SODIUM 100 MG PO CAPS: 100.0000 mg | ORAL_CAPSULE | Freq: Two times a day (BID) | ORAL | 2 refills | Status: AC

## 2024-02-21 MED ORDER — ONDANSETRON HCL 4 MG/2ML IJ SOLN
INTRAMUSCULAR | Status: AC
  Filled 2024-02-21: qty 4

## 2024-02-21 MED ORDER — ROPIVACAINE HCL 5 MG/ML IJ SOLN
INTRAMUSCULAR | Status: DC | PRN
  Administered 2024-02-21: 20 mL via PERINEURAL

## 2024-02-21 MED ORDER — DIPHENHYDRAMINE HCL 50 MG/ML IJ SOLN
12.5000 mg | Freq: Once | INTRAMUSCULAR | Status: AC
  Administered 2024-02-21: 12.5 mg via INTRAVENOUS
  Filled 2024-02-21: qty 1

## 2024-02-21 MED ORDER — VANCOMYCIN HCL 1000 MG IV SOLR
INTRAVENOUS | Status: AC
  Filled 2024-02-21: qty 20

## 2024-02-21 MED ORDER — MIDAZOLAM HCL (PF) 2 MG/2ML IJ SOLN
INTRAMUSCULAR | Status: DC | PRN
  Administered 2024-02-21 (×2): 1 mg via INTRAVENOUS

## 2024-02-21 MED ORDER — MIDAZOLAM HCL 2 MG/2ML IJ SOLN
INTRAMUSCULAR | Status: AC
  Filled 2024-02-21: qty 2

## 2024-02-21 MED ORDER — DEXAMETHASONE SOD PHOSPHATE PF 10 MG/ML IJ SOLN
INTRAMUSCULAR | Status: DC | PRN
  Administered 2024-02-21: 10 mg via PERINEURAL

## 2024-02-21 MED ORDER — ROCURONIUM BROMIDE 10 MG/ML (PF) SYRINGE
PREFILLED_SYRINGE | INTRAVENOUS | Status: AC
  Filled 2024-02-21: qty 20

## 2024-02-21 MED ORDER — FENTANYL CITRATE (PF) 100 MCG/2ML IJ SOLN
INTRAMUSCULAR | Status: AC
  Administered 2024-02-21: 100 ug via INTRAVENOUS
  Filled 2024-02-21: qty 2

## 2024-02-21 MED ORDER — ASPIRIN 81 MG PO TBEC: 81.0000 mg | DELAYED_RELEASE_TABLET | Freq: Two times a day (BID) | ORAL | 0 refills | Status: AC

## 2024-02-21 MED ORDER — POVIDONE-IODINE 10 % EX SWAB
2.0000 | Freq: Once | CUTANEOUS | Status: AC
  Administered 2024-02-21: 2 via TOPICAL

## 2024-02-21 MED ORDER — CHLORHEXIDINE GLUCONATE 4 % EX SOLN: 60.0000 mL | Freq: Once | CUTANEOUS | Status: DC

## 2024-02-21 MED ORDER — ONDANSETRON HCL 4 MG/2ML IJ SOLN: 4.0000 mg | Freq: Once | INTRAMUSCULAR | Status: DC | PRN

## 2024-02-21 MED ORDER — KETOROLAC TROMETHAMINE 30 MG/ML IJ SOLN
INTRAMUSCULAR | Status: AC
  Filled 2024-02-21: qty 2

## 2024-02-21 MED ORDER — HYDROMORPHONE HCL 1 MG/ML IJ SOLN
0.5000 mg | Freq: Once | INTRAMUSCULAR | Status: AC
  Administered 2024-02-21: 0.5 mg via INTRAVENOUS
  Filled 2024-02-21: qty 1

## 2024-02-21 MED ORDER — SUCCINYLCHOLINE CHLORIDE 200 MG/10ML IV SOSY
PREFILLED_SYRINGE | INTRAVENOUS | Status: AC
  Filled 2024-02-21: qty 20

## 2024-02-21 MED ORDER — ATROPINE SULFATE (PF) 0.4 MG/ML IJ SOLN
INTRAMUSCULAR | Status: AC
  Filled 2024-02-21: qty 1

## 2024-02-21 MED ORDER — MEPERIDINE HCL 25 MG/ML IJ SOLN: 6.2500 mg | INTRAMUSCULAR | Status: DC | PRN

## 2024-02-21 MED ORDER — PHENYLEPHRINE 80 MCG/ML (10ML) SYRINGE FOR IV PUSH (FOR BLOOD PRESSURE SUPPORT)
PREFILLED_SYRINGE | INTRAVENOUS | Status: AC
  Filled 2024-02-21: qty 20

## 2024-02-21 MED ORDER — LACTATED RINGERS IV SOLN: INTRAVENOUS | Status: DC

## 2024-02-21 MED ORDER — SCOPOLAMINE 1 MG/3DAYS TD PT72
1.0000 | MEDICATED_PATCH | TRANSDERMAL | Status: DC
  Administered 2024-02-21: 1 mg via TRANSDERMAL
  Filled 2024-02-21: qty 1

## 2024-02-21 MED ORDER — DEXAMETHASONE SOD PHOSPHATE PF 10 MG/ML IJ SOLN
INTRAMUSCULAR | Status: AC
  Filled 2024-02-21: qty 1

## 2024-02-21 MED ORDER — MIDAZOLAM HCL (PF) 2 MG/2ML IJ SOLN: 2.0000 mg | Freq: Once | INTRAMUSCULAR | Status: AC

## 2024-02-21 MED ORDER — ORAL CARE MOUTH RINSE: 15.0000 mL | Freq: Once | OROMUCOSAL | Status: DC

## 2024-02-21 MED ORDER — OXYCODONE-ACETAMINOPHEN 5-325 MG PO TABS: 1.0000 | ORAL_TABLET | ORAL | 0 refills | Status: AC | PRN

## 2024-02-21 MED ORDER — BUPIVACAINE HCL (PF) 0.25 % IJ SOLN
INTRAMUSCULAR | Status: AC
  Filled 2024-02-21: qty 30

## 2024-02-21 MED ORDER — OXYCODONE HCL 5 MG/5ML PO SOLN: 5.0000 mg | Freq: Once | ORAL | Status: DC | PRN

## 2024-02-21 MED ORDER — MUPIROCIN 2 % EX OINT: 1.0000 | TOPICAL_OINTMENT | Freq: Two times a day (BID) | CUTANEOUS | 0 refills | Status: AC

## 2024-02-21 MED ORDER — BUPIVACAINE LIPOSOME 1.3 % IJ SUSP
INTRAMUSCULAR | Status: DC | PRN
  Administered 2024-02-21: 10 mL via PERINEURAL

## 2024-02-21 MED ORDER — DROPERIDOL 2.5 MG/ML IJ SOLN
INTRAMUSCULAR | Status: AC
  Filled 2024-02-21: qty 2

## 2024-02-21 MED ORDER — BUPIVACAINE HCL 0.5 % IJ SOLN
INTRAMUSCULAR | Status: DC | PRN
  Administered 2024-02-21: 10 mL

## 2024-02-21 MED ORDER — OXYCODONE HCL 5 MG PO TABS
ORAL_TABLET | ORAL | Status: AC
  Filled 2024-02-21: qty 1

## 2024-02-21 MED ORDER — CEFAZOLIN SODIUM-DEXTROSE 2-4 GM/100ML-% IV SOLN
2.0000 g | INTRAVENOUS | Status: AC
  Administered 2024-02-21: 2 g via INTRAVENOUS

## 2024-02-21 MED ORDER — DROPERIDOL 2.5 MG/ML IJ SOLN
0.6250 mg | Freq: Once | INTRAMUSCULAR | Status: AC
  Administered 2024-02-21: 0.625 mg via INTRAVENOUS

## 2024-02-21 MED ORDER — ONDANSETRON HCL 4 MG/2ML IJ SOLN
INTRAMUSCULAR | Status: DC | PRN
  Administered 2024-02-21: 4 mg via INTRAVENOUS

## 2024-02-21 MED ORDER — LIDOCAINE 2% (20 MG/ML) 5 ML SYRINGE
INTRAMUSCULAR | Status: AC
  Filled 2024-02-21: qty 5

## 2024-02-21 MED ORDER — VANCOMYCIN HCL 1000 MG IV SOLR
INTRAVENOUS | Status: DC | PRN
  Administered 2024-02-21: 1000 mg via INTRAVENOUS

## 2024-02-21 MED ORDER — CEFAZOLIN SODIUM-DEXTROSE 2-4 GM/100ML-% IV SOLN
INTRAVENOUS | Status: AC
  Filled 2024-02-21: qty 100

## 2024-02-21 MED ORDER — MIDAZOLAM HCL 2 MG/2ML IJ SOLN
INTRAMUSCULAR | Status: AC
  Administered 2024-02-21: 2 mg via INTRAVENOUS
  Filled 2024-02-21: qty 2

## 2024-02-21 MED ORDER — LIDOCAINE 2% (20 MG/ML) 5 ML SYRINGE
INTRAMUSCULAR | Status: DC | PRN
  Administered 2024-02-21: 100 mg via INTRAVENOUS

## 2024-02-21 MED ORDER — BUPIVACAINE HCL (PF) 0.25 % IJ SOLN
INTRAMUSCULAR | Status: DC | PRN
  Administered 2024-02-21: 30 mL

## 2024-02-21 MED ORDER — PROPOFOL 10 MG/ML IV BOLUS
INTRAVENOUS | Status: DC | PRN
  Administered 2024-02-21: 200 mg via INTRAVENOUS

## 2024-02-21 MED ORDER — CHLORHEXIDINE GLUCONATE 0.12 % MT SOLN: 15.0000 mL | Freq: Once | OROMUCOSAL | Status: DC

## 2024-02-21 MED ORDER — OXYCODONE HCL 5 MG PO TABS: 5.0000 mg | ORAL_TABLET | Freq: Once | ORAL | Status: DC | PRN

## 2024-02-21 MED ORDER — FENTANYL CITRATE (PF) 100 MCG/2ML IJ SOLN: 25.0000 ug | INTRAMUSCULAR | Status: DC | PRN

## 2024-02-21 MED ORDER — FENTANYL CITRATE (PF) 250 MCG/5ML IJ SOLN
INTRAMUSCULAR | Status: AC
  Filled 2024-02-21: qty 5

## 2024-02-21 MED ORDER — ONDANSETRON HCL 4 MG/2ML IJ SOLN
INTRAMUSCULAR | Status: AC
  Filled 2024-02-21: qty 2

## 2024-02-21 MED ORDER — CHLORHEXIDINE GLUCONATE 0.12 % MT SOLN: 15.0000 mL | Freq: Once | OROMUCOSAL | Status: AC

## 2024-02-21 MED ORDER — ORAL CARE MOUTH RINSE
15.0000 mL | Freq: Once | OROMUCOSAL | Status: AC
  Administered 2024-02-21: 15 mL via OROMUCOSAL

## 2024-02-21 NOTE — ED Triage Notes (Signed)
 Pt BIB [facility] d/t slipping on ice & now has a deformity to her Rt ankle, denies LOC, A/Ox4. EMS stared 20g Lt AC & gave 200 mcg Fent with 200cc NS. VSS at 106/70, 85 bpm, 98% on RA. Pt reports she called her orthopedic provider & is already scheduled for surgery at 1300 today here at the hospital (per EMS).

## 2024-02-21 NOTE — ED Provider Notes (Signed)
 " [facility] Provider Note   CSN: [id number] Arrival date & time: 02/21/24  1128     Patient presents with: No chief complaint on file.   Norma [name] is a 64 y.o. female.   64 yo F with a cc of falling on the ice.  Pain and swelling to her ankle.  She took a picture with her phone and send it to her orthopedic doctor who scheduled her a surgery this afternoon.  She denies other injury.        Prior to Admission medications  Medication Sig Start Date End Date Taking? Authorizing Provider  albuterol  (PROVENTIL ) (2.5 MG/3ML) 0.083% nebulizer solution Take 3 mLs (2.5 mg total) by nebulization every 6 (six) hours as needed for wheezing or shortness of breath. 11/01/21   Randol Simmonds, MD  albuterol  (VENTOLIN  HFA) 108 [phone] Base) MCG/ACT inhaler Inhale 2 puffs into the lungs every 6 (six) hours as needed for wheezing or shortness of breath. 09/30/23   Fleming, Zelda W, NP  Ascorbic Acid (VITAMIN C PO) Take 1 tablet by mouth daily.    [provider]  buPROPion  (WELLBUTRIN  SR) 150 MG 12 hr tablet Take 150 mg by mouth daily.    [provider]  buPROPion  (WELLBUTRIN  XL) 300 MG 24 hr tablet Take 300 mg by mouth every morning. 09/16/21   [provider]  citalopram  (CELEXA ) 40 MG tablet Take 40 mg by mouth daily.    [provider]  clonazePAM  (KLONOPIN ) 1 MG tablet Take 1 mg by mouth at bedtime.     [provider]  doxycycline  (VIBRAMYCIN ) 100 MG capsule Take 1 capsule (100 mg total) by mouth 2 (two) times daily. 04/12/22   Hazen Darryle BRAVO, FNP  fenofibrate 160 MG tablet Take 160 mg by mouth every evening.    [provider]  ibuprofen  (ADVIL ,MOTRIN ) 800 MG tablet Take 1 tablet (800 mg total) by mouth 3 (three) times daily. 04/15/16   Long, Fonda MATSU, MD  lidocaine  (LIDODERM ) 5 % Place 1 patch onto the skin daily. Remove & Discard patch within 12 hours or as directed by MD 11/24/20   Freddi Hamilton, MD   Multiple Vitamin (MULTIVITAMIN) tablet Take 1 tablet by mouth daily.    [provider]  ondansetron  (ZOFRAN  ODT) 4 MG disintegrating tablet Take 1 tablet (4 mg total) by mouth every 8 (eight) hours as needed for nausea or vomiting. 08/12/20   Long, Fonda MATSU, MD  promethazine -dextromethorphan (PROMETHAZINE -DM) 6.25-15 MG/5ML syrup Take 5 mLs by mouth at bedtime. 09/30/23   Fleming, Zelda W, NP    Allergies: Codeine and Other    Review of Systems  Updated Vital Signs BP 118/77   Pulse 91   Temp 98.5 F (36.9 C) (Oral)   Resp 16   Ht 5' 5 (1.651 m)   Wt 89.8 kg   SpO2 97%   BMI 32.94 kg/m   Physical Exam Vitals and nursing note reviewed.  Constitutional:      General: She is not in acute distress.    Appearance: She is well-developed. She is not diaphoretic.  HENT:     Head: Normocephalic and atraumatic.  Eyes:     Pupils: Pupils are equal, round, and reactive to light.  Cardiovascular:     Rate and Rhythm: Normal rate and regular rhythm.     Heart sounds: No murmur heard.    No friction rub. No gallop.  Pulmonary:  Effort: Pulmonary effort is normal.     Breath sounds: No wheezing or rales.  Abdominal:     General: There is no distension.     Palpations: Abdomen is soft.     Tenderness: There is no abdominal tenderness.  Musculoskeletal:        General: Swelling and tenderness present.     Cervical back: Normal range of motion and neck supple.     Comments: Pain and swelling to the right ankle.  Pulse motor and sensation are intact distally.  No obvious pain in the fibular neck.  Skin:    General: Skin is warm and dry.  Neurological:     Mental Status: She is alert and oriented to person, place, and time.  Psychiatric:        Behavior: Behavior normal.     (all labs ordered are listed, but only abnormal results are displayed) Labs Reviewed  CBC WITH DIFFERENTIAL/PLATELET - Abnormal; Notable for the following components:      Result Value   WBC 11.7  (*)    Neutro Abs 8.4 (*)    All other components within normal limits  BASIC METABOLIC PANEL WITH GFR - Abnormal; Notable for the following components:   Glucose, Bld 124 (*)    Calcium 8.6 (*)    All other components within normal limits  SURGICAL PCR SCREEN    EKG: None  Radiology: DG MINI C-ARM IMAGE ONLY Result Date: 02/21/2024 There is no interpretation for this exam.  This order is for images obtained during a surgical procedure.  Please See Surgeries Tab for more information regarding the procedure.   DG Ankle Complete Right Result Date: 02/21/2024 CLINICAL DATA:  ankle pain post fall EXAM: RIGHT ANKLE - COMPLETE 3+ VIEW COMPARISON:  March 10, 2016 FINDINGS: There is a displaced mildly comminuted fracture of the distal fibula with intra-articular extension and widening of the lateral clear space. There is apex medial angulation of the fibula. There is medial dislocation of the tibia in relation to the talar dome. There is a mildly displaced fracture of the medial malleolus with inferior displacement. Associated soft tissue edema. A definitive posterior malleolar fracture is not visualized. IMPRESSION: 1. Unstable lateral and medial malleolar fracture. 2. Medial dislocation of the tibia in relation to the talar dome. Electronically Signed   By: Corean Salter M.D.   On: 02/21/2024 13:30     Procedures   Medications Ordered in the ED  lactated ringers  infusion ( Intravenous Continued from Pre-op 02/21/24 1517)  chlorhexidine  (HIBICLENS ) 4 % liquid 4 Application (has no administration in time range)  lactated ringers  infusion (has no administration in time range)  scopolamine  (TRANSDERM-SCOP) 1 MG/3DAYS 1 mg (1 mg Transdermal Patch Applied 02/21/24 1435)  bupivacaine  (PF) (MARCAINE ) 0.25 % injection (30 mLs  Given 02/21/24 1544)  HYDROmorphone  (DILAUDID ) injection 0.5 mg (0.5 mg Intravenous Given 02/21/24 1209)  chlorhexidine  (PERIDEX ) 0.12 % solution 15 mL ( Mouth/Throat See  Alternative 02/21/24 1407)    Or  Oral care mouth rinse (15 mLs Mouth Rinse Given 02/21/24 1407)  povidone-iodine  10 % swab 2 Application (2 Applications Topical Given 02/21/24 1407)  ceFAZolin  (ANCEF ) IVPB 2g/100 mL premix (2 g Intravenous Given 02/21/24 1529)  ceFAZolin  (ANCEF ) 2-4 GM/100ML-% IVPB (  Override pull for Anesthesia 02/21/24 1535)  fentaNYL  (SUBLIMAZE ) injection 100 mcg (100 mcg Intravenous Given 02/21/24 1435)  midazolam  PF (VERSED ) injection 2 mg (2 mg Intravenous Given 02/21/24 1429)  diphenhydrAMINE  (BENADRYL ) injection 12.5 mg (12.5 mg Intravenous  Given 02/21/24 1456)                                    Medical Decision Making Amount and/or Complexity of Data Reviewed Labs: ordered. Radiology: ordered. ECG/medicine tests: ordered.  Risk Prescription drug management.   64 yo F with a cc of a fall.  Patient has a obvious right ankle fracture.  She had called her doctor who arranged with orthopedics to have this repaired today.  I did discuss the case briefly with orthopedics.  Will obtain a plain film blood work EKG.  I did offer to have the patient splinted here Ortho okay with leaving in temporary splint from EMS.  Plain film of the ankle my independent or potation with Weber C fibular fracture with lateral displacement of the talus.  Fracture of the medial malleolus as well.  The patients results and plan were reviewed and discussed.   Any x-rays performed were independently reviewed by myself.   Differential diagnosis were considered with the presenting HPI.  Medications  lactated ringers  infusion ( Intravenous Continued from Pre-op 02/21/24 1517)  chlorhexidine  (HIBICLENS ) 4 % liquid 4 Application (has no administration in time range)  lactated ringers  infusion (has no administration in time range)  scopolamine  (TRANSDERM-SCOP) 1 MG/3DAYS 1 mg (1 mg Transdermal Patch Applied 02/21/24 1435)  bupivacaine  (PF) (MARCAINE ) 0.25 % injection (30 mLs  Given 02/21/24 1544)   HYDROmorphone  (DILAUDID ) injection 0.5 mg (0.5 mg Intravenous Given 02/21/24 1209)  chlorhexidine  (PERIDEX ) 0.12 % solution 15 mL ( Mouth/Throat See Alternative 02/21/24 1407)    Or  Oral care mouth rinse (15 mLs Mouth Rinse Given 02/21/24 1407)  povidone-iodine  10 % swab 2 Application (2 Applications Topical Given 02/21/24 1407)  ceFAZolin  (ANCEF ) IVPB 2g/100 mL premix (2 g Intravenous Given 02/21/24 1529)  ceFAZolin  (ANCEF ) 2-4 GM/100ML-% IVPB (  Override pull for Anesthesia 02/21/24 1535)  fentaNYL  (SUBLIMAZE ) injection 100 mcg (100 mcg Intravenous Given 02/21/24 1435)  midazolam  PF (VERSED ) injection 2 mg (2 mg Intravenous Given 02/21/24 1429)  diphenhydrAMINE  (BENADRYL ) injection 12.5 mg (12.5 mg Intravenous Given 02/21/24 1456)    Vitals:   02/21/24 1415 02/21/24 1420 02/21/24 1425 02/21/24 1430  BP: 117/69 113/82 118/77   Pulse: 86 90 92 91  Resp: 13 15 18 16   Temp:      TempSrc:      SpO2: 98% 97% 97% 97%  Weight:      Height:        Final diagnoses:  Closed fracture of right ankle, initial encounter    Admission/ observation were discussed with the admitting physician, patient and/or family and they are comfortable with the plan.       Final diagnoses:  Closed fracture of right ankle, initial encounter    ED Discharge Orders     None          Emil Share, DO 02/21/24 1614  "

## 2024-02-21 NOTE — Anesthesia Preprocedure Evaluation (Addendum)
"                                    Anesthesia Evaluation  Patient identified by MRN, date of birth, ID band Patient awake    Reviewed: Allergy & Precautions, H&P , NPO status , Patient's Chart, lab work & pertinent test results  History of Anesthesia Complications (+) PONV and history of anesthetic complications  Airway Mallampati: III  TM Distance: >3 FB Neck ROM: Full  Mouth opening: Limited Mouth Opening  Dental  (+) Chipped, Dental Advisory Given,    Pulmonary Current Smoker and Patient abstained from smoking.   Pulmonary exam normal breath sounds clear to auscultation       Cardiovascular Exercise Tolerance: Good negative cardio ROS Normal cardiovascular exam Rhythm:Regular Rate:Normal     Neuro/Psych  PSYCHIATRIC DISORDERS Anxiety Depression     Neuromuscular disease negative neurological ROS  negative psych ROS   GI/Hepatic negative GI ROS, Neg liver ROS,,,  Endo/Other  negative endocrine ROS    Renal/GU negative Renal ROS  negative genitourinary   Musculoskeletal negative musculoskeletal ROS (+)    Abdominal   Peds  Hematology negative hematology ROS (+)   Anesthesia Other Findings   Reproductive/Obstetrics negative OB ROS                              Anesthesia Physical Anesthesia Plan  ASA: 3 and emergent  Anesthesia Plan: General and Regional   Post-op Pain Management: Minimal or no pain anticipated, Toradol  IV (intra-op)*, Ofirmev  IV (intra-op)* and Regional block*   Induction: Intravenous and Cricoid pressure planned  PONV Risk Score and Plan: 3 and Midazolam , Dexamethasone , Ondansetron  and Scopolamine  patch - Pre-op  Airway Management Planned: Oral ETT and Video Laryngoscope Planned  Additional Equipment: None  Intra-op Plan:   Post-operative Plan: Extubation in OR  Informed Consent: I have reviewed the patients History and Physical, chart, labs and discussed the procedure including the risks,  benefits and alternatives for the proposed anesthesia with the patient or authorized representative who has indicated his/her understanding and acceptance.     Dental advisory given  Plan Discussed with: CRNA and Anesthesiologist  Anesthesia Plan Comments:          Anesthesia Quick Evaluation  "

## 2024-02-21 NOTE — Consult Note (Signed)
 Reason for Consult: Right Ankle Fracture Referring Physician: Dr. Emil Olam JAYSON Norma [name] is an 64 y.o. female.   HPI: Patient is a pleasant 64 year old female who slipped on the ice earlier this morning.  She fell and noticed a deformity in her right ankle.  She called EMS and has been transported to [facility], ER.  We are currently awaiting x-rays.  She denies any loss of consciousness.  She contacted Signe Reining PA-C with [facility] and is currently scheduled for open reduction internal fixation of the right ankle.  Past Medical History:  Diagnosis Date   Anxiety    Depression    PONV (postoperative nausea and vomiting)    Pre-diabetes    Snores    Volar retinacular ganglion     Past Surgical History:  Procedure Laterality Date   ABDOMINAL HYSTERECTOMY     ANTERIOR CERVICAL DECOMP/DISCECTOMY FUSION N/A 04/10/2013   Procedure: ANTERIOR CERVICAL DECOMPRESSION/DISCECTOMY FUSION 1 LEVEL;  Surgeon: Oneil Rodgers Priestly, MD;  Location: [facility] OR;  Service: Orthopedics;  Laterality: N/A;  Anterior cervical decompression fusion, cervical 6-7 with instrumentation and allograft   APPENDECTOMY     BARTHOLIN CYST MARSUPIALIZATION N/A 03/07/2013   Procedure: BARTHOLIN CYST MARSUPIALIZATION;  Surgeon: Delon CHRISTELLA Prude, DO;  Location: [facility] ORS;  Service: Gynecology;  Laterality: N/A;   CHOLECYSTECTOMY N/A 09/02/2020   Procedure: LAPAROSCOPIC CHOLECYSTECTOMY;  Surgeon: Vernetta Berg, MD;  Location: [facility] ORS;  Service: General;  Laterality: N/A;   COLONOSCOPY     COLONOSCOPY WITH PROPOFOL  N/A 06/15/2016   Procedure: COLONOSCOPY WITH PROPOFOL ;  Surgeon: Vicci Gladis POUR, MD;  Location: [facility] ENDOSCOPY;  Service: Endoscopy;  Laterality: N/A;   INCISION AND DRAINAGE HIP     RT   REDUCTION MAMMAPLASTY Bilateral 1978   SHOULDER ARTHROSCOPY     RT   WRIST GANGLION EXCISION     X2 RT WRIST, X1 LT WRIST    Family History  Problem Relation Age of Onset   Cancer Mother    Cancer Father    Breast cancer Neg Hx      Social History:  reports that she has been smoking cigarettes. She has a 11.5 pack-year smoking history. She has never used smokeless tobacco. She reports current alcohol use. She reports that she does not use drugs.  Allergies: Allergies[1]  Medications: I have reviewed the patient's current medications.  No results found for this or any previous visit (from the past 48 hours).  No results found.  Review of Systems  Constitutional: Negative.   HENT: Negative.    Eyes: Negative.   Respiratory: Negative.    Cardiovascular: Negative.   Gastrointestinal: Negative.   Endocrine:       Diabetes  Genitourinary: Negative.   Musculoskeletal:  Positive for arthralgias.  Allergic/Immunologic: Negative.   Neurological: Negative.   Psychiatric/Behavioral: Negative.     Blood pressure 117/62, pulse 78, temperature 97.8 F (36.6 C), resp. rate 20, height 5' 5 (1.651 m), weight 89.8 kg, SpO2 98%. Physical Exam Constitutional:      Appearance: Normal appearance. She is obese.  HENT:     Head: Normocephalic and atraumatic.     Nose: Nose normal.  Cardiovascular:     Pulses: Normal pulses.  Pulmonary:     Effort: Pulmonary effort is normal.  Musculoskeletal:        General: Swelling, tenderness and deformity present.     Cervical back: Normal range of motion.     Comments: Right ankle pain with obvious deformity of the medial  malleoli region.  She is able to freely wiggle her toes and has intact sensation distally.  Skin:    General: Skin is warm and dry.  Neurological:     General: No focal deficit present.     Mental Status: She is alert and oriented to person, place, and time. Mental status is at baseline.  Psychiatric:        Mood and Affect: Mood normal.        Behavior: Behavior normal.        Thought Content: Thought content normal.        Judgment: Judgment normal.     Assessment/Plan: Right Bimalleolar ankle fracture  Plan is to take the patient to surgery with  open reduction internal fixation of ankle fracture.  We were discussed with Dr. Yvone surgical planning and once we have imaging we will relay this to the patient.  Patient has expressed interest to discharge same day.  She will need to be nonweightbearing on the right leg following surgery.  Plan is to proceed with surgical intervention later this afternoon.  Norma [name] 02/21/2024, 12:35 PM         [1]  Allergies Allergen Reactions   Codeine Itching    In high doses   Other Itching and Rash    Chlorhexidine  wipes - rash all over body

## 2024-02-21 NOTE — Anesthesia Procedure Notes (Addendum)
 Anesthesia Regional Block: Popliteal block   Pre-Anesthetic Checklist: , timeout performed,  Correct Patient, Correct Site, Correct Laterality,  Correct Procedure, Correct Position, site marked,  Risks and benefits discussed,  Surgical consent,  Pre-op evaluation,  At surgeon's request and post-op pain management  Laterality: Right  Prep: chloraprep       Needles:  Injection technique: Single-shot  Needle Type: Echogenic Stimulator Needle     Needle Length: 5cm  Needle Gauge: 22     Additional Needles:   Procedures:, nerve stimulator,,, ultrasound used (permanent image in chart),,     Nerve Stimulator or Paresthesia:  Response: quadraceps contraction, 0.45 mA  Additional Responses:   Narrative:  Start time: 02/21/2024 2:10 PM End time: 02/21/2024 2:15 PM Injection made incrementally with aspirations every 5 mL.  Performed by: Personally  Anesthesiologist: Mallory Manus, MD  Additional Notes: Functioning IV was confirmed and monitors were applied.  A 50mm 22ga Arrow echogenic stimulator needle was used. Sterile prep and drape,hand hygiene and sterile gloves were used. Ultrasound guidance: relevant anatomy identified, needle position confirmed, local anesthetic spread visualized around nerve(s)., vascular puncture avoided.  Image printed for medical record. Negative aspiration and negative test dose prior to incremental administration of local anesthetic. The patient tolerated the procedure well.

## 2024-02-21 NOTE — Anesthesia Procedure Notes (Signed)
 Procedure Name: LMA Insertion Date/Time: 02/21/2024 3:24 PM  Performed by: Vertie Arthea RAMAN, CRNAPre-anesthesia Checklist: Patient identified, Emergency Drugs available, Suction available and Patient being monitored Patient Re-evaluated:Patient Re-evaluated prior to induction Oxygen Delivery Method: Circle System Utilized Preoxygenation: Pre-oxygenation with 100% oxygen Induction Type: IV induction Ventilation: Mask ventilation without difficulty LMA: LMA inserted LMA Size: 4.0 Number of attempts: 1 Airway Equipment and Method: Bite block Placement Confirmation: positive ETCO2 Tube secured with: Tape Dental Injury: Teeth and Oropharynx as per pre-operative assessment

## 2024-02-21 NOTE — Anesthesia Postprocedure Evaluation (Signed)
"   Anesthesia Post Note  Patient: Norma [name]  Procedure(s) Performed: OPEN REDUCTION INTERNAL FIXATION (ORIF)  BIMALLEOLAR ANKLE FRACTURE (Right: Ankle)     Patient location during evaluation: PACU Anesthesia Type: Regional and General Level of consciousness: awake and alert Pain management: pain level controlled Vital Signs Assessment: post-procedure vital signs reviewed and stable Respiratory status: spontaneous breathing, nonlabored ventilation, respiratory function stable and patient connected to nasal cannula oxygen Cardiovascular status: blood pressure returned to baseline and stable Postop Assessment: no apparent nausea or vomiting Anesthetic complications: no   No notable events documented.  Last Vitals:  Vitals:   02/21/24 1730 02/21/24 1745  BP: (!) 152/123 137/79  Pulse: 93 94  Resp: 17 13  Temp:    SpO2: 96% 93%    Last Pain:  Vitals:   02/21/24 1807  TempSrc:   PainSc: (P) 6                  Pammie Chirino      "

## 2024-02-21 NOTE — Transfer of Care (Signed)
 Immediate Anesthesia Transfer of Care Note  Patient: Norma [name]  Procedure(s) Performed: OPEN REDUCTION INTERNAL FIXATION (ORIF)  BIMALLEOLAR ANKLE FRACTURE (Right: Ankle)  Patient Location: PACU  Anesthesia Type:GA combined with regional for post-op pain  Level of Consciousness: drowsy and patient cooperative  Airway & Oxygen Therapy: Patient Spontanous Breathing and Patient connected to face mask oxygen  Post-op Assessment: Report given to RN and Post -op Vital signs reviewed and stable  Post vital signs: Reviewed and stable  Last Vitals:  Vitals Value Taken Time  BP 130/71 02/21/24 17:15  Temp    Pulse 94 02/21/24 17:19  Resp 19 02/21/24 17:19  SpO2 97 % 02/21/24 17:19  Vitals shown include unfiled device data.  Last Pain:  Vitals:   02/21/24 1404  TempSrc:   PainSc: 8       Patients Stated Pain Goal: 2 (02/21/24 1404)  Complications: No notable events documented.

## 2024-02-21 NOTE — Anesthesia Procedure Notes (Signed)
 Anesthesia Regional Block: Adductor canal block   Pre-Anesthetic Checklist: , timeout performed,  Correct Patient, Correct Site, Correct Laterality,  Correct Procedure, Correct Position, site marked,  Risks and benefits discussed,  Surgical consent,  Pre-op evaluation,  At surgeon's request and post-op pain management  Laterality: Right  Prep: chloraprep       Needles:  Injection technique: Single-shot  Needle Type: Echogenic Stimulator Needle     Needle Length: 5cm  Needle Gauge: 22     Additional Needles:   Procedures:,,,, ultrasound used (permanent image in chart),,    Narrative:  Start time: 02/21/2024 2:05 PM End time: 02/21/2024 2:10 PM Injection made incrementally with aspirations every 5 mL.  Performed by: Personally  Anesthesiologist: Mallory Manus, MD  Additional Notes: Functioning IV was confirmed and monitors were applied.  A 50mm 22ga Arrow echogenic stimulator needle was used. Sterile prep and drape,hand hygiene and sterile gloves were used. Ultrasound guidance: relevant anatomy identified, needle position confirmed, local anesthetic spread visualized around nerve(s)., vascular puncture avoided.  Image printed for medical record. Negative aspiration and negative test dose prior to incremental administration of local anesthetic. The patient tolerated the procedure well.

## 2024-02-21 NOTE — Op Note (Signed)
 Preoperative diagnosis: Right bimalleolar ankle fracture displaced  Postoperative diagnosis: Same  Procedure: Open reduction internal fixation of right ankle 2 cannulated screws medially and 7 hole one third tubular plate with interfragmentary screw fixation laterally along with a 2-0 FiberWire cerclage Surgeon: Norleen Gavel M.D.  First assistant: Camellia POUR. Orlando RIGGERS  (present throughout entire procedure and necessary for timely completion of the procedure) Anesthetic: Gen. LMA  Estimated blood loss: Minimal  Tourniquet time: Approximately 45 minutes but final tourniquet time can be achieved from the anesthetic record Indications for procedure: Patient was walking on ice this morning and slipped and fell in her driveway.  She texted us  from the driveway prior to calling EMS.  She presented to [facility] where x-rays show displaced bimalleolar ankle fracture.  Description of procedure: The patient was identified by arm band, and received preoperative IV antibiotics in the holding area at, [facility]. She then received a right popliteal block, was taken to the operating room where the appropriate anesthetic monitors were attached and general LMA anesthesia was induced. A tourniquet was applied high to the right calf and the right lower from a prepped and draped in the usual sterile fashion from the toes to the tourniquet. A time out procedure was performed. The limb was elevated for 2 minutes and the tourniquet inflated to 250 mm of mercury. We began the operation by making a longitudinal incision starting at the tip of the medial malleolus and going proximally for about 5 cm. Small bleeders were identified and cauterized and we found the fracture site.  The fracture was irrigated thoroughly to remove clotted elements.  Periosteum was removed.  We were then able to obtain an anatomic reduction with a tenaculum.  2 guidewires were placed to achieve and hold anatomic reduction.  2-44 mm  partially-threaded cannulated screws were used to lock the medial malleolus fragment in place.  Fluoroscopic images were taken at this point which showed anatomic reduction of the medial malleolus.  Attention was turned to the lateral side where an incision was made for exposure of the fibula.  The fracture was easily identifiable and irrigated thoroughly to remove clotting elements.  Anatomic reduction was achieved with a lobster jaw clamp and an interfragmentary screw was placed anterior to posterior locking the 2 main fragments in place.  There was a large butterfly fragment posteriorly.  At this point a 7 hole plate was used as a neutralization and 3 cortical screws were placed proximal to the main fracture line and 3 cortical screws placed distal to the main fracture line.  Anatomic reduction was achieved.  I took a 2-0 FiberWire and passed it around the central portion of the butterfly fragment.  This was hand tied to help give us  some tension to the butterfly fragment.  Final C-arm images were obtained and saved which showed anatomic reduction and good reduction of the mortise.  The mortise was stressed to make sure that there was no mortise injury.  The medial wound was closed with two 3-0 Vicryl interrupted sutures and then running nylon.  The lateral wound was closed with 3-0 Vicryl and 3-0 nylon running suture.  A sterile compressive dressing was applied with the foot at 90 degrees.  A posterior and U-shaped plaster were applied.  The tourniquet was let down and the patient was taken to recovery room where she was noted to be in satisfactory condition.  The patient will be nonweightbearing for 4 to 6 weeks and I will see her  back in the office in 2 weeks.  The patient's nasal swab did come back intraoperatively to be MRSA positive.  We Betadineed her nostrils, gave her a gram of vancomycin , and will send her home on mupirocin .  I will see her back in 2 weeks but happy to see her sooner should issues  arise.  Of note [name] was present with our case and assisted by retraction of the leg, manipulation of tissues, and closing minimize OR time.  Fluoroscopic images were taken and interpreted by me throughout the case.

## 2024-02-21 NOTE — H&P (Signed)
 A pre op hand p   Chief Complaint: r ankle pain  HPI: Norma [name] is a 64 y.o. female who presents for evaluation of r ankle pain. It has been present for several hrs and has been worsening. She has failed conservative measures. Pain is rated as severe.  Past Medical History:  Diagnosis Date   Anxiety    Depression    PONV (postoperative nausea and vomiting)    Pre-diabetes    Snores    Volar retinacular ganglion    Past Surgical History:  Procedure Laterality Date   ABDOMINAL HYSTERECTOMY     ANTERIOR CERVICAL DECOMP/DISCECTOMY FUSION N/A 04/10/2013   Procedure: ANTERIOR CERVICAL DECOMPRESSION/DISCECTOMY FUSION 1 LEVEL;  Surgeon: Oneil Rodgers Priestly, MD;  Location: [facility] OR;  Service: Orthopedics;  Laterality: N/A;  Anterior cervical decompression fusion, cervical 6-7 with instrumentation and allograft   APPENDECTOMY     BARTHOLIN CYST MARSUPIALIZATION N/A 03/07/2013   Procedure: BARTHOLIN CYST MARSUPIALIZATION;  Surgeon: Delon CHRISTELLA Prude, DO;  Location: [facility] ORS;  Service: Gynecology;  Laterality: N/A;   CHOLECYSTECTOMY N/A 09/02/2020   Procedure: LAPAROSCOPIC CHOLECYSTECTOMY;  Surgeon: Vernetta Berg, MD;  Location: [facility] ORS;  Service: General;  Laterality: N/A;   COLONOSCOPY     COLONOSCOPY WITH PROPOFOL  N/A 06/15/2016   Procedure: COLONOSCOPY WITH PROPOFOL ;  Surgeon: Vicci Gladis POUR, MD;  Location: [facility] END[facility]COPY;  Service: Endoscopy;  Laterality: N/A;   INCISION AND DRAINAGE HIP     RT   REDUCTION MAMMAPLASTY Bilateral 1978   SHOULDER ARTHROSCOPY     RT   WRIST GANGLION EXCISION     X2 RT WRIST, X1 LT WRIST   Social History   Socioeconomic History   Marital status: Married    Spouse name: Not on file   Number of children: Not on file   Years of education: Not on file   Highest education level: Not on file  Occupational History   Not on file  Tobacco Use   Smoking status: Every Day    Current packs/day: 0.25    Average packs/day: 0.3 packs/day for 46.0 years (11.5 ttl  pk-yrs)    Types: Cigarettes   Smokeless tobacco: Never  Vaping Use   Vaping status: Never Used  Substance and Sexual Activity   Alcohol use: Yes    Comment: SOCIAL   Drug use: No   Sexual activity: Not on file  Other Topics Concern   Not on file  Social History Narrative   Not on file   Social Drivers of Health   Tobacco Use: High Risk (02/21/2024)   Patient History    Smoking Tobacco Use: Every Day    Smokeless Tobacco Use: Never    Passive Exposure: Not on file  Financial Resource Strain: Not on file  Food Insecurity: Not on file  Transportation Needs: Not on file  Physical Activity: Not on file  Stress: Not on file  Social Connections: Not on file  Depression (EYV7-0): Not on file  Alcohol Screen: Not on file  Housing: Not on file  Utilities: Not on file  Health Literacy: Not on file   Family History  Problem Relation Age of Onset   Cancer Mother    Cancer Father    Breast cancer Neg Hx    Allergies[1] Prior to Admission medications  Medication Sig Start Date End Date Taking? Authorizing Provider  albuterol  (PROVENTIL ) (2.5 MG/3ML) 0.083% nebulizer solution Take 3 mLs (2.5 mg total) by nebulization every 6 (six) hours as needed for wheezing or  shortness of breath. 11/01/21   Randol Simmonds, MD  albuterol  (VENTOLIN  HFA) 108 (90 Base) MCG/ACT inhaler Inhale 2 puffs into the lungs every 6 (six) hours as needed for wheezing or shortness of breath. 09/30/23   Fleming, Zelda W, NP  Ascorbic Acid (VITAMIN C PO) Take 1 tablet by mouth daily.    [provider]  buPROPion  (WELLBUTRIN  SR) 150 MG 12 hr tablet Take 150 mg by mouth daily.    [provider]  buPROPion  (WELLBUTRIN  XL) 300 MG 24 hr tablet Take 300 mg by mouth every morning. 09/16/21   [provider]  citalopram  (CELEXA ) 40 MG tablet Take 40 mg by mouth daily.    [provider]  clonazePAM  (KLONOPIN ) 1 MG tablet Take 1 mg by mouth at bedtime.     [provider]  doxycycline   (VIBRAMYCIN ) 100 MG capsule Take 1 capsule (100 mg total) by mouth 2 (two) times daily. 04/12/22   Hazen Darryle BRAVO, FNP  fenofibrate 160 MG tablet Take 160 mg by mouth every evening.    [provider]  ibuprofen  (ADVIL ,MOTRIN ) 800 MG tablet Take 1 tablet (800 mg total) by mouth 3 (three) times daily. 04/15/16   Long, Fonda MATSU, MD  lidocaine  (LIDODERM ) 5 % Place 1 patch onto the skin daily. Remove & Discard patch within 12 hours or as directed by MD 11/24/20   Freddi Hamilton, MD  Multiple Vitamin (MULTIVITAMIN) tablet Take 1 tablet by mouth daily.    [provider]  ondansetron  (ZOFRAN  ODT) 4 MG disintegrating tablet Take 1 tablet (4 mg total) by mouth every 8 (eight) hours as needed for nausea or vomiting. 08/12/20   Long, Fonda MATSU, MD  promethazine -dextromethorphan (PROMETHAZINE -DM) 6.25-15 MG/5ML syrup Take 5 mLs by mouth at bedtime. 09/30/23   Theotis Haze ORN, NP     Positive ROS: none  All other systems have been reviewed and were otherwise negative with the exception of those mentioned in the HPI and as above.  Physical Exam: Vitals:   02/21/24 1207  BP: 117/62  Pulse: 78  Resp: 20  Temp: 97.8 F (36.6 C)  SpO2: 98%    General: Alert, no acute distress Cardiovascular: No pedal edema Respiratory: No cyanosis, no use of accessory musculature GI: No organomegaly, abdomen is soft and non-tender Skin: No lesions in the area of chief complaint Neurologic: Sensation intact distally Psychiatric: Patient is competent for consent with normal mood and affect Lymphatic: No axillary or cervical lymphadenopathy  MUSCULOSKELETAL: r ankle obvious deformity. Nvi distally  Xray: displaced bimalleolar fracture  Assessment/Plan: ankle fracture Plan for Procedures: OPEN REDUCTION INTERNAL FIXATION (ORIF) ANKLE FRACTURE  The risks benefits and alternatives were discussed with the patient including but not limited to the risks of nonoperative treatment, versus surgical  intervention including infection, bleeding, nerve injury, malunion, nonunion, hardware prominence, hardware failure, need for hardware removal, blood clots, cardiopulmonary complications, morbidity, mortality, among others, and they were willing to proceed.  Predicted outcome is good, although there will be at least a six to nine month expected recovery.  Norleen LITTIE Gavel, MD 02/21/2024 1:25 PM    [1]  Allergies Allergen Reactions   Codeine Itching    In high doses   Other Itching and Rash    Chlorhexidine  wipes - rash all over body

## 2024-02-22 ENCOUNTER — Encounter (HOSPITAL_COMMUNITY): Payer: Self-pay | Admitting: Orthopedic Surgery
